# Patient Record
Sex: Male | Born: 1970 | Race: Black or African American | Hispanic: No | Smoking: Current some day smoker
Health system: Southern US, Community
[De-identification: ages and names within clinical notes are randomized; demographics above are authoritative.]

## PROBLEM LIST (undated history)

## (undated) ENCOUNTER — Ambulatory Visit (HOSPITAL_COMMUNITY): Admission: EM | Payer: Medicaid Other | Source: Home / Self Care

## (undated) DIAGNOSIS — G8929 Other chronic pain: Secondary | ICD-10-CM

## (undated) DIAGNOSIS — K649 Unspecified hemorrhoids: Secondary | ICD-10-CM

## (undated) DIAGNOSIS — T8859XA Other complications of anesthesia, initial encounter: Secondary | ICD-10-CM

## (undated) DIAGNOSIS — F319 Bipolar disorder, unspecified: Secondary | ICD-10-CM

## (undated) DIAGNOSIS — R531 Weakness: Secondary | ICD-10-CM

## (undated) DIAGNOSIS — S83519A Sprain of anterior cruciate ligament of unspecified knee, initial encounter: Secondary | ICD-10-CM

## (undated) DIAGNOSIS — G4701 Insomnia due to medical condition: Secondary | ICD-10-CM

## (undated) DIAGNOSIS — E119 Type 2 diabetes mellitus without complications: Secondary | ICD-10-CM

## (undated) DIAGNOSIS — F29 Unspecified psychosis not due to a substance or known physiological condition: Secondary | ICD-10-CM

## (undated) DIAGNOSIS — R51 Headache: Secondary | ICD-10-CM

## (undated) DIAGNOSIS — R609 Edema, unspecified: Secondary | ICD-10-CM

## (undated) DIAGNOSIS — F209 Schizophrenia, unspecified: Secondary | ICD-10-CM

## (undated) HISTORY — DX: Unspecified psychosis not due to a substance or known physiological condition: F29

## (undated) HISTORY — DX: Insomnia due to medical condition: G47.01

## (undated) HISTORY — DX: Other chronic pain: G89.29

---

## 2001-10-14 ENCOUNTER — Ambulatory Visit (HOSPITAL_BASED_OUTPATIENT_CLINIC_OR_DEPARTMENT_OTHER): Admission: RE | Admit: 2001-10-14 | Discharge: 2001-10-14 | Payer: Self-pay | Admitting: Orthopedic Surgery

## 2003-10-16 ENCOUNTER — Emergency Department (HOSPITAL_COMMUNITY): Admission: EM | Admit: 2003-10-16 | Discharge: 2003-10-16 | Payer: Self-pay | Admitting: Emergency Medicine

## 2003-10-27 ENCOUNTER — Ambulatory Visit: Payer: Self-pay | Admitting: Internal Medicine

## 2003-10-28 ENCOUNTER — Ambulatory Visit: Payer: Self-pay | Admitting: Internal Medicine

## 2003-10-28 ENCOUNTER — Ambulatory Visit: Payer: Self-pay | Admitting: *Deleted

## 2003-11-23 ENCOUNTER — Ambulatory Visit: Payer: Self-pay | Admitting: Internal Medicine

## 2003-12-14 ENCOUNTER — Ambulatory Visit: Payer: Self-pay | Admitting: Internal Medicine

## 2004-03-13 ENCOUNTER — Ambulatory Visit: Payer: Self-pay | Admitting: Internal Medicine

## 2004-03-14 ENCOUNTER — Ambulatory Visit: Payer: Self-pay | Admitting: Internal Medicine

## 2004-03-14 ENCOUNTER — Ambulatory Visit (HOSPITAL_COMMUNITY): Admission: RE | Admit: 2004-03-14 | Discharge: 2004-03-14 | Payer: Self-pay | Admitting: Internal Medicine

## 2004-04-11 ENCOUNTER — Ambulatory Visit: Payer: Self-pay | Admitting: Internal Medicine

## 2004-05-12 ENCOUNTER — Ambulatory Visit: Payer: Self-pay | Admitting: Internal Medicine

## 2004-06-29 ENCOUNTER — Emergency Department (HOSPITAL_COMMUNITY): Admission: EM | Admit: 2004-06-29 | Discharge: 2004-06-29 | Payer: Self-pay | Admitting: Emergency Medicine

## 2004-07-18 ENCOUNTER — Ambulatory Visit: Payer: Self-pay | Admitting: Internal Medicine

## 2004-08-01 ENCOUNTER — Ambulatory Visit: Payer: Self-pay | Admitting: Internal Medicine

## 2004-08-02 ENCOUNTER — Ambulatory Visit (HOSPITAL_COMMUNITY): Admission: RE | Admit: 2004-08-02 | Discharge: 2004-08-02 | Payer: Self-pay | Admitting: Internal Medicine

## 2004-10-04 ENCOUNTER — Ambulatory Visit: Payer: Self-pay | Admitting: Internal Medicine

## 2004-10-10 ENCOUNTER — Ambulatory Visit: Payer: Self-pay | Admitting: Internal Medicine

## 2005-04-03 ENCOUNTER — Emergency Department (HOSPITAL_COMMUNITY): Admission: EM | Admit: 2005-04-03 | Discharge: 2005-04-03 | Payer: Self-pay

## 2005-05-09 ENCOUNTER — Encounter: Admission: RE | Admit: 2005-05-09 | Discharge: 2005-05-22 | Payer: Self-pay | Admitting: Occupational Medicine

## 2005-12-08 ENCOUNTER — Emergency Department (HOSPITAL_COMMUNITY): Admission: EM | Admit: 2005-12-08 | Discharge: 2005-12-08 | Payer: Self-pay | Admitting: Emergency Medicine

## 2005-12-21 ENCOUNTER — Ambulatory Visit: Payer: Self-pay | Admitting: Internal Medicine

## 2007-06-27 ENCOUNTER — Emergency Department (HOSPITAL_COMMUNITY): Admission: EM | Admit: 2007-06-27 | Discharge: 2007-06-27 | Payer: Self-pay | Admitting: Emergency Medicine

## 2007-06-30 ENCOUNTER — Emergency Department (HOSPITAL_COMMUNITY): Admission: EM | Admit: 2007-06-30 | Discharge: 2007-06-30 | Payer: Self-pay | Admitting: Emergency Medicine

## 2009-01-27 ENCOUNTER — Emergency Department (HOSPITAL_COMMUNITY): Admission: EM | Admit: 2009-01-27 | Discharge: 2009-01-27 | Payer: Self-pay | Admitting: Family Medicine

## 2009-07-28 ENCOUNTER — Emergency Department (HOSPITAL_COMMUNITY): Admission: EM | Admit: 2009-07-28 | Discharge: 2009-07-29 | Payer: Self-pay | Admitting: Emergency Medicine
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
## 2010-05-30 ENCOUNTER — Emergency Department (HOSPITAL_COMMUNITY)
Admission: EM | Admit: 2010-05-30 | Discharge: 2010-05-30 | Disposition: A | Discharge status: Home / Self Care | Payer: No Typology Code available for payment source | Attending: Emergency Medicine | Admitting: Emergency Medicine

## 2010-05-30 DIAGNOSIS — M549 Dorsalgia, unspecified: Secondary | ICD-10-CM | POA: Insufficient documentation

## 2010-05-30 DIAGNOSIS — S139XXA Sprain of joints and ligaments of unspecified parts of neck, initial encounter: Secondary | ICD-10-CM | POA: Insufficient documentation

## 2010-05-30 DIAGNOSIS — M542 Cervicalgia: Secondary | ICD-10-CM | POA: Insufficient documentation

## 2012-01-30 ENCOUNTER — Emergency Department (HOSPITAL_COMMUNITY)
Admission: EM | Admit: 2012-01-30 | Discharge: 2012-01-30 | Disposition: A | Discharge status: Home / Self Care | Payer: No Typology Code available for payment source | Attending: Emergency Medicine | Admitting: Emergency Medicine

## 2012-01-30 ENCOUNTER — Encounter (HOSPITAL_COMMUNITY): Payer: Self-pay | Admitting: Emergency Medicine

## 2012-01-30 DIAGNOSIS — F17200 Nicotine dependence, unspecified, uncomplicated: Secondary | ICD-10-CM | POA: Insufficient documentation

## 2012-01-30 DIAGNOSIS — S0993XA Unspecified injury of face, initial encounter: Secondary | ICD-10-CM | POA: Insufficient documentation

## 2012-01-30 DIAGNOSIS — Y929 Unspecified place or not applicable: Secondary | ICD-10-CM | POA: Insufficient documentation

## 2012-01-30 DIAGNOSIS — Z8659 Personal history of other mental and behavioral disorders: Secondary | ICD-10-CM | POA: Insufficient documentation

## 2012-01-30 DIAGNOSIS — Y9389 Activity, other specified: Secondary | ICD-10-CM | POA: Insufficient documentation

## 2012-01-30 DIAGNOSIS — Z8739 Personal history of other diseases of the musculoskeletal system and connective tissue: Secondary | ICD-10-CM | POA: Insufficient documentation

## 2012-01-30 DIAGNOSIS — M545 Low back pain, unspecified: Secondary | ICD-10-CM | POA: Insufficient documentation

## 2012-01-30 DIAGNOSIS — S199XXA Unspecified injury of neck, initial encounter: Secondary | ICD-10-CM | POA: Insufficient documentation

## 2012-01-30 HISTORY — DX: Schizophrenia, unspecified: F20.9

## 2012-01-30 MED ORDER — CYCLOBENZAPRINE HCL 10 MG PO TABS: 10.0000 mg | ORAL_TABLET | Freq: Two times a day (BID) | ORAL | Status: DC | PRN

## 2012-01-30 MED ORDER — CYCLOBENZAPRINE HCL 10 MG PO TABS
10.0000 mg | ORAL_TABLET | Freq: Once | ORAL | Status: AC
  Administered 2012-01-30: 10 mg via ORAL
  Filled 2012-01-30: qty 1

## 2012-01-30 MED ORDER — OXYCODONE-ACETAMINOPHEN 5-325 MG PO TABS
1.0000 | ORAL_TABLET | Freq: Once | ORAL | Status: AC
  Administered 2012-01-30: 1 via ORAL
  Filled 2012-01-30: qty 1

## 2012-01-30 MED ORDER — OXYCODONE-ACETAMINOPHEN 5-325 MG PO TABS: 1.0000 | ORAL_TABLET | ORAL | Status: DC | PRN
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
## 2012-02-08 ENCOUNTER — Emergency Department (HOSPITAL_COMMUNITY)
Admission: EM | Admit: 2012-02-08 | Discharge: 2012-02-09 | Disposition: A | Discharge status: Home / Self Care | Payer: No Typology Code available for payment source | Attending: Emergency Medicine | Admitting: Emergency Medicine

## 2012-02-08 ENCOUNTER — Encounter (HOSPITAL_COMMUNITY): Payer: Self-pay | Admitting: Emergency Medicine

## 2012-02-08 DIAGNOSIS — Z79899 Other long term (current) drug therapy: Secondary | ICD-10-CM | POA: Insufficient documentation

## 2012-02-08 DIAGNOSIS — Y92410 Unspecified street and highway as the place of occurrence of the external cause: Secondary | ICD-10-CM | POA: Insufficient documentation

## 2012-02-08 DIAGNOSIS — Z8781 Personal history of (healed) traumatic fracture: Secondary | ICD-10-CM | POA: Insufficient documentation

## 2012-02-08 DIAGNOSIS — S161XXA Strain of muscle, fascia and tendon at neck level, initial encounter: Secondary | ICD-10-CM

## 2012-02-08 DIAGNOSIS — IMO0002 Reserved for concepts with insufficient information to code with codable children: Secondary | ICD-10-CM | POA: Insufficient documentation

## 2012-02-08 DIAGNOSIS — F209 Schizophrenia, unspecified: Secondary | ICD-10-CM | POA: Insufficient documentation

## 2012-02-08 DIAGNOSIS — S139XXA Sprain of joints and ligaments of unspecified parts of neck, initial encounter: Secondary | ICD-10-CM | POA: Insufficient documentation

## 2012-02-08 DIAGNOSIS — F17200 Nicotine dependence, unspecified, uncomplicated: Secondary | ICD-10-CM | POA: Insufficient documentation

## 2012-02-08 DIAGNOSIS — T148XXA Other injury of unspecified body region, initial encounter: Secondary | ICD-10-CM

## 2012-02-08 DIAGNOSIS — Y9389 Activity, other specified: Secondary | ICD-10-CM | POA: Insufficient documentation
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
## 2012-02-08 NOTE — ED Notes (Signed)
Attempted X 1 to start IV without success.

## 2012-02-09 ENCOUNTER — Emergency Department (HOSPITAL_COMMUNITY): Payer: No Typology Code available for payment source

## 2012-02-09 MED ORDER — HYDROCODONE-ACETAMINOPHEN 5-325 MG PO TABS
1.0000 | ORAL_TABLET | Freq: Once | ORAL | Status: AC
  Administered 2012-02-09: 1 via ORAL
  Filled 2012-02-09: qty 1

## 2012-02-09 MED ORDER — IBUPROFEN 800 MG PO TABS: 800.0000 mg | ORAL_TABLET | Freq: Three times a day (TID) | ORAL | Status: DC | PRN

## 2012-02-09 MED ORDER — CYCLOBENZAPRINE HCL 10 MG PO TABS: 10.0000 mg | ORAL_TABLET | Freq: Three times a day (TID) | ORAL | Status: DC | PRN

## 2012-02-09 NOTE — ED Provider Notes (Signed)
Medical screening examination/treatment/procedure(s) were performed by non-physician practitioner and as supervising physician I was immediately available for consultation/collaboration.  Olivia Mackie, MD 02/09/12 [phone]
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
## 2012-03-25 ENCOUNTER — Emergency Department (HOSPITAL_COMMUNITY)
Admission: EM | Admit: 2012-03-25 | Discharge: 2012-03-25 | Disposition: A | Discharge status: Home / Self Care | Payer: No Typology Code available for payment source | Attending: Emergency Medicine | Admitting: Emergency Medicine

## 2012-03-25 ENCOUNTER — Encounter (HOSPITAL_COMMUNITY): Payer: Self-pay | Admitting: Cardiology

## 2012-03-25 DIAGNOSIS — Y9389 Activity, other specified: Secondary | ICD-10-CM | POA: Insufficient documentation

## 2012-03-25 DIAGNOSIS — F17200 Nicotine dependence, unspecified, uncomplicated: Secondary | ICD-10-CM | POA: Insufficient documentation

## 2012-03-25 DIAGNOSIS — M542 Cervicalgia: Secondary | ICD-10-CM

## 2012-03-25 DIAGNOSIS — Z79899 Other long term (current) drug therapy: Secondary | ICD-10-CM | POA: Insufficient documentation

## 2012-03-25 DIAGNOSIS — Z8781 Personal history of (healed) traumatic fracture: Secondary | ICD-10-CM | POA: Insufficient documentation

## 2012-03-25 DIAGNOSIS — F209 Schizophrenia, unspecified: Secondary | ICD-10-CM | POA: Insufficient documentation

## 2012-03-25 DIAGNOSIS — Y92410 Unspecified street and highway as the place of occurrence of the external cause: Secondary | ICD-10-CM | POA: Insufficient documentation

## 2012-03-25 DIAGNOSIS — S0993XA Unspecified injury of face, initial encounter: Secondary | ICD-10-CM | POA: Insufficient documentation

## 2012-03-25 MED ORDER — NAPROXEN 500 MG PO TABS: 500.0000 mg | ORAL_TABLET | Freq: Two times a day (BID) | ORAL | Status: DC

## 2012-03-25 MED ORDER — CYCLOBENZAPRINE HCL 10 MG PO TABS: 10.0000 mg | ORAL_TABLET | Freq: Two times a day (BID) | ORAL | Status: DC | PRN

## 2012-03-25 MED ORDER — KETOROLAC TROMETHAMINE 60 MG/2ML IM SOLN
60.0000 mg | Freq: Once | INTRAMUSCULAR | Status: AC
  Administered 2012-03-25: 60 mg via INTRAMUSCULAR
  Filled 2012-03-25: qty 2

## 2012-03-25 MED ORDER — DIAZEPAM 5 MG PO TABS
5.0000 mg | ORAL_TABLET | Freq: Once | ORAL | Status: AC
  Administered 2012-03-25: 5 mg via ORAL
  Filled 2012-03-25: qty 1
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
## 2012-03-25 NOTE — ED Notes (Signed)
Pt given discharge paperwork; pt verbalized understanding of d/c; no additional questions regarding d/c; VSS; resps e/u; e-signature obtained; ambulatory on discharge.
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
## 2012-03-25 NOTE — ED Notes (Signed)
Denies numbness or tingling.

## 2012-03-25 NOTE — ED Provider Notes (Signed)
Medical screening examination/treatment/procedure(s) were performed by non-physician practitioner and as supervising physician I was immediately available for consultation/collaboration.   Carleene Cooper III, MD 03/25/12 2002

## 2012-11-12 ENCOUNTER — Encounter (HOSPITAL_COMMUNITY): Payer: Self-pay | Admitting: Emergency Medicine

## 2012-11-12 ENCOUNTER — Emergency Department (HOSPITAL_COMMUNITY): Payer: Medicaid Other

## 2012-11-12 ENCOUNTER — Emergency Department (HOSPITAL_COMMUNITY)
Admission: EM | Admit: 2012-11-12 | Discharge: 2012-11-12 | Disposition: A | Discharge status: Home / Self Care | Payer: Medicaid Other | Attending: Emergency Medicine | Admitting: Emergency Medicine

## 2012-11-12 DIAGNOSIS — F17200 Nicotine dependence, unspecified, uncomplicated: Secondary | ICD-10-CM | POA: Insufficient documentation

## 2012-11-12 DIAGNOSIS — F209 Schizophrenia, unspecified: Secondary | ICD-10-CM | POA: Insufficient documentation

## 2012-11-12 DIAGNOSIS — Z880 Allergy status to penicillin: Secondary | ICD-10-CM | POA: Insufficient documentation

## 2012-11-12 DIAGNOSIS — Z79899 Other long term (current) drug therapy: Secondary | ICD-10-CM | POA: Insufficient documentation

## 2012-11-12 DIAGNOSIS — M5412 Radiculopathy, cervical region: Secondary | ICD-10-CM | POA: Insufficient documentation

## 2012-11-12 DIAGNOSIS — M4802 Spinal stenosis, cervical region: Secondary | ICD-10-CM | POA: Insufficient documentation

## 2012-11-12 DIAGNOSIS — M6281 Muscle weakness (generalized): Secondary | ICD-10-CM | POA: Insufficient documentation

## 2012-11-12 DIAGNOSIS — R209 Unspecified disturbances of skin sensation: Secondary | ICD-10-CM | POA: Insufficient documentation

## 2012-11-12 DIAGNOSIS — Z8781 Personal history of (healed) traumatic fracture: Secondary | ICD-10-CM | POA: Insufficient documentation

## 2012-11-12 LAB — COMPREHENSIVE METABOLIC PANEL
ALT: 20 U/L (ref 0–53)
AST: 18 U/L (ref 0–37)
Albumin: 3.9 g/dL (ref 3.5–5.2)
Alkaline Phosphatase: 58 U/L (ref 39–117)
CO2: 24 mEq/L (ref 19–32)
Chloride: 104 mEq/L (ref 96–112)
Creatinine, Ser: 0.96 mg/dL (ref 0.50–1.35)
GFR calc non Af Amer: >90 mL/min (ref >90)
Potassium: 4.1 mEq/L (ref 3.5–5.1)
Total Bilirubin: 0.3 mg/dL (ref 0.3–1.2)

## 2012-11-12 LAB — CBC WITH DIFFERENTIAL/PLATELET
Basophils Relative: 1 % (ref 0–1)
Hemoglobin: 13.5 g/dL (ref 13.0–17.0)
Lymphocytes Relative: 35 % (ref 12–46)
Lymphs Abs: 2.3 10*3/uL (ref 0.7–4.0)
Monocytes Relative: 7 % (ref 3–12)
Neutro Abs: 3.4 10*3/uL (ref 1.7–7.7)
Neutrophils Relative %: 51 % (ref 43–77)
RBC: 4.46 MIL/uL (ref 4.22–5.81)
WBC: 6.7 10*3/uL (ref 4.0–10.5)

## 2012-11-12 MED ORDER — ONDANSETRON HCL 4 MG/2ML IJ SOLN
4.0000 mg | Freq: Once | INTRAMUSCULAR | Status: AC
  Administered 2012-11-12: 4 mg via INTRAVENOUS
  Filled 2012-11-12: qty 2

## 2012-11-12 MED ORDER — DEXAMETHASONE SODIUM PHOSPHATE 10 MG/ML IJ SOLN
10.0000 mg | Freq: Once | INTRAMUSCULAR | Status: AC
  Administered 2012-11-12: 10 mg via INTRAVENOUS
  Filled 2012-11-12: qty 1

## 2012-11-12 MED ORDER — OXYCODONE-ACETAMINOPHEN 5-325 MG PO TABS
2.0000 | ORAL_TABLET | Freq: Once | ORAL | Status: AC
  Administered 2012-11-12: 2 via ORAL
  Filled 2012-11-12: qty 2

## 2012-11-12 MED ORDER — METHYLPREDNISOLONE (PAK) 4 MG PO TABS: ORAL_TABLET | ORAL | Status: DC

## 2012-11-12 MED ORDER — HYDROMORPHONE HCL PF 1 MG/ML IJ SOLN
1.0000 mg | Freq: Once | INTRAMUSCULAR | Status: AC
  Administered 2012-11-12: 1 mg via INTRAVENOUS
  Filled 2012-11-12: qty 1

## 2012-11-12 MED ORDER — MORPHINE SULFATE 4 MG/ML IJ SOLN
4.0000 mg | Freq: Once | INTRAMUSCULAR | Status: AC
  Administered 2012-11-12: 4 mg via INTRAVENOUS
  Filled 2012-11-12: qty 1

## 2012-11-12 MED ORDER — OXYCODONE-ACETAMINOPHEN 5-325 MG PO TABS: 2.0000 | ORAL_TABLET | ORAL | Status: DC | PRN
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
## 2012-11-12 NOTE — ED Notes (Signed)
Per pt sts for the past few weeks he has been having constant bilateral arm pain and numbness. sts worse on the left side.

## 2012-11-12 NOTE — ED Notes (Signed)
Pt complains of numbness and tingling on his left arm distal to the elbow. Pt states tingling prevents him from working and it makes it difficult to sleep. 9/10 for pain in the left arm. PT states that tips of his fingers are tingling in his right arm. Pt denies chest pain. Pt denies hx of diabetes, but has a family hx of DM and HTN.

## 2012-11-13 ENCOUNTER — Other Ambulatory Visit: Payer: Self-pay | Admitting: Neurosurgery

## 2012-11-18 ENCOUNTER — Encounter (HOSPITAL_COMMUNITY): Payer: Self-pay

## 2012-11-18 ENCOUNTER — Encounter (HOSPITAL_COMMUNITY)
Admission: RE | Admit: 2012-11-18 | Discharge: 2012-11-18 | Disposition: A | Discharge status: Home / Self Care | Payer: No Typology Code available for payment source | Source: Ambulatory Visit | Attending: Neurosurgery | Admitting: Neurosurgery

## 2012-11-18 LAB — BASIC METABOLIC PANEL
BUN: 13 mg/dL (ref 6–23)
CO2: 25 mEq/L (ref 19–32)
Calcium: 9.5 mg/dL (ref 8.4–10.5)
GFR calc Af Amer: >90 mL/min (ref >90)
Glucose, Bld: 94 mg/dL (ref 70–99)
Potassium: 4 mEq/L (ref 3.5–5.1)
Sodium: 139 mEq/L (ref 135–145)

## 2012-11-18 LAB — SURGICAL PCR SCREEN: Staphylococcus aureus: NEGATIVE

## 2012-11-18 LAB — CBC
Hemoglobin: 14.2 g/dL (ref 13.0–17.0)
MCH: 30.3 pg (ref 26.0–34.0)
MCHC: 33.7 g/dL (ref 30.0–36.0)
MCV: 89.8 fL (ref 78.0–100.0)
Platelets: 249 10*3/uL (ref 150–400)
RBC: 4.69 MIL/uL (ref 4.22–5.81)
RDW: 14.2 % (ref 11.5–15.5)
WBC: 20 10*3/uL — ABNORMAL HIGH (ref 4.0–10.5)

## 2012-11-18 MED ORDER — VANCOMYCIN HCL 10 G IV SOLR
1500.0000 mg | Freq: Once | INTRAVENOUS | Status: AC
  Administered 2012-11-19: 1500 mg via INTRAVENOUS
  Filled 2012-11-18: qty 1500

## 2012-11-18 NOTE — Progress Notes (Signed)
Erie Noe at Dr Lonie Peak office made aware that patient's  WBC was 20,000 and that he was on a Medrol dose pack which he finished today.

## 2012-11-18 NOTE — Pre-Procedure Instructions (Signed)
[facility]  11/18/2012   Your procedure is scheduled on:  Wednesday November 12 th at 1540 PM  Report to [facility] Main Entrance "A"at 1300 PM.  Call this number if you have problems the morning of surgery: [phone]   Remember:   Do not eat food or drink liquids after midnight.   Take these medicines the morning of surgery with A SIP OF WATER: None   Do not wear jewelry, make-up or nail polish.  Do not wear lotions, powders. You may wear deodorant.             Men may shave face and neck.  Do not bring valuables to the hospital.  [facility] is not responsible  for any belongings or valuables.               Contacts, dentures or bridgework may not be worn into surgery.  Leave suitcase in the car. After surgery it may be brought to your room.  For patients admitted to the hospital, discharge time is determined by your treatment team.               Patients discharged the day of surgery will not be allowed to drive  home.    Special Instructions: Shower using CHG 2 nights before surgery and the night before surgery.  If you shower the day of surgery use CHG.  Use special wash - you have one bottle of CHG for all showers.  You should use approximately 1/3 of the bottle for each shower.   Please read over the following fact sheets that you were given: Pain Booklet, Coughing and Deep Breathing, MRSA Information and Surgical Site Infection Prevention

## 2012-11-18 NOTE — Progress Notes (Signed)
Anesthesia Note: Patient for C2-7 posterior cervical arthrodesis tomorrow with Dr. Wynetta Emery.  PAT was this afternoon.  He was started on a Medrol dose pack on 11/12/12.  WBC is now 20K. Most likely steroid are contributing.  I did instruct the PAT RN to notify Dr. Lonie Peak office.  Will defer additional orders to him.  Clinical correlation on the day of surgery. He was afebrile at his PAT visit.  Velna Ochs [facility] Short Stay Center/Anesthesiology Phone [phone] 11/18/2012 4:37 PM

## 2012-11-18 NOTE — Progress Notes (Signed)
Notified Dr. Wynetta Emery office that orders need to be release.

## 2012-11-19 ENCOUNTER — Inpatient Hospital Stay (HOSPITAL_COMMUNITY): Payer: No Typology Code available for payment source

## 2012-11-19 ENCOUNTER — Encounter (HOSPITAL_COMMUNITY)
Admission: RE | Disposition: A | Discharge status: Home / Self Care | Payer: Self-pay | Source: Ambulatory Visit | Attending: Neurosurgery

## 2012-11-19 ENCOUNTER — Inpatient Hospital Stay (HOSPITAL_COMMUNITY)
Admission: RE | Admit: 2012-11-19 | Discharge: 2012-11-22 | DRG: 473 | Disposition: A | Discharge status: Home / Self Care | Payer: No Typology Code available for payment source | Source: Ambulatory Visit | Attending: Neurosurgery | Admitting: Neurosurgery

## 2012-11-19 ENCOUNTER — Inpatient Hospital Stay (HOSPITAL_COMMUNITY): Payer: No Typology Code available for payment source | Admitting: Anesthesiology

## 2012-11-19 ENCOUNTER — Encounter (HOSPITAL_COMMUNITY): Payer: Self-pay | Admitting: *Deleted

## 2012-11-19 ENCOUNTER — Encounter (HOSPITAL_COMMUNITY): Payer: No Typology Code available for payment source | Admitting: Vascular Surgery

## 2012-11-19 DIAGNOSIS — M4712 Other spondylosis with myelopathy, cervical region: Secondary | ICD-10-CM | POA: Diagnosis present

## 2012-11-19 DIAGNOSIS — F209 Schizophrenia, unspecified: Secondary | ICD-10-CM | POA: Diagnosis present

## 2012-11-19 DIAGNOSIS — J438 Other emphysema: Secondary | ICD-10-CM | POA: Diagnosis present

## 2012-11-19 DIAGNOSIS — R209 Unspecified disturbances of skin sensation: Secondary | ICD-10-CM | POA: Diagnosis present

## 2012-11-19 DIAGNOSIS — F17200 Nicotine dependence, unspecified, uncomplicated: Secondary | ICD-10-CM | POA: Diagnosis present

## 2012-11-19 DIAGNOSIS — M4802 Spinal stenosis, cervical region: Secondary | ICD-10-CM

## 2012-11-19 HISTORY — PX: POSTERIOR CERVICAL FUSION/FORAMINOTOMY: SHX5038

## 2012-11-19 SURGERY — POSTERIOR CERVICAL FUSION/FORAMINOTOMY LEVEL 5
Anesthesia: General | Wound class: Clean

## 2012-11-19 MED ORDER — HYDROMORPHONE HCL PF 1 MG/ML IJ SOLN
INTRAMUSCULAR | Status: AC
  Filled 2012-11-19: qty 1

## 2012-11-19 MED ORDER — FENTANYL CITRATE 0.05 MG/ML IJ SOLN
INTRAMUSCULAR | Status: DC | PRN
  Administered 2012-11-19: 50 ug via INTRAVENOUS
  Administered 2012-11-19: 100 ug via INTRAVENOUS
  Administered 2012-11-19: 50 ug via INTRAVENOUS
  Administered 2012-11-19 (×2): 100 ug via INTRAVENOUS

## 2012-11-19 MED ORDER — ONDANSETRON HCL 4 MG/2ML IJ SOLN
INTRAMUSCULAR | Status: DC | PRN
  Administered 2012-11-19: 4 mg via INTRAVENOUS

## 2012-11-19 MED ORDER — PANTOPRAZOLE SODIUM 40 MG PO TBEC
40.0000 mg | DELAYED_RELEASE_TABLET | Freq: Two times a day (BID) | ORAL | Status: DC
  Administered 2012-11-19 – 2012-11-22 (×6): 40 mg via ORAL
  Filled 2012-11-19 (×5): qty 1

## 2012-11-19 MED ORDER — NEOSTIGMINE METHYLSULFATE 1 MG/ML IJ SOLN
INTRAMUSCULAR | Status: DC | PRN
  Administered 2012-11-19: 4 mg via INTRAVENOUS

## 2012-11-19 MED ORDER — PHENOL 1.4 % MT LIQD: 1.0000 | OROMUCOSAL | Status: DC | PRN

## 2012-11-19 MED ORDER — OXYCODONE HCL 5 MG PO TABS
5.0000 mg | ORAL_TABLET | Freq: Once | ORAL | Status: AC | PRN
  Administered 2012-11-19: 5 mg via ORAL

## 2012-11-19 MED ORDER — HYDROMORPHONE HCL PF 1 MG/ML IJ SOLN
0.2500 mg | INTRAMUSCULAR | Status: DC | PRN
  Administered 2012-11-19 (×4): 0.5 mg via INTRAVENOUS

## 2012-11-19 MED ORDER — 0.9 % SODIUM CHLORIDE (POUR BTL) OPTIME
TOPICAL | Status: DC | PRN
  Administered 2012-11-19: 1000 mL

## 2012-11-19 MED ORDER — VANCOMYCIN HCL IN DEXTROSE 1-5 GM/200ML-% IV SOLN
INTRAVENOUS | Status: AC
  Filled 2012-11-19: qty 200

## 2012-11-19 MED ORDER — SODIUM CHLORIDE 0.9 % IV SOLN: 250.0000 mL | INTRAVENOUS | Status: DC

## 2012-11-19 MED ORDER — ACETAMINOPHEN 325 MG PO TABS: 650.0000 mg | ORAL_TABLET | ORAL | Status: DC | PRN

## 2012-11-19 MED ORDER — PROPOFOL 10 MG/ML IV BOLUS
INTRAVENOUS | Status: DC | PRN
  Administered 2012-11-19: 250 mg via INTRAVENOUS

## 2012-11-19 MED ORDER — HYDROCODONE-ACETAMINOPHEN 5-325 MG PO TABS
1.0000 | ORAL_TABLET | ORAL | Status: DC | PRN
  Administered 2012-11-20 (×3): 2 via ORAL
  Filled 2012-11-19 (×3): qty 2

## 2012-11-19 MED ORDER — HYDROMORPHONE HCL PF 1 MG/ML IJ SOLN
0.5000 mg | INTRAMUSCULAR | Status: DC | PRN
  Administered 2012-11-19 – 2012-11-20 (×3): 1 mg via INTRAVENOUS
  Filled 2012-11-19 (×3): qty 1

## 2012-11-19 MED ORDER — HYDROMORPHONE HCL PF 1 MG/ML IJ SOLN
INTRAMUSCULAR | Status: AC
  Administered 2012-11-19: 0.5 mg via INTRAVENOUS
  Filled 2012-11-19: qty 1

## 2012-11-19 MED ORDER — OXYCODONE HCL 5 MG/5ML PO SOLN: 5.0000 mg | Freq: Once | ORAL | Status: AC | PRN

## 2012-11-19 MED ORDER — ALUM & MAG HYDROXIDE-SIMETH 200-200-20 MG/5ML PO SUSP: 30.0000 mL | Freq: Four times a day (QID) | ORAL | Status: DC | PRN

## 2012-11-19 MED ORDER — DEXAMETHASONE SODIUM PHOSPHATE 10 MG/ML IJ SOLN
10.0000 mg | Freq: Four times a day (QID) | INTRAMUSCULAR | Status: DC
  Administered 2012-11-19 – 2012-11-21 (×6): 10 mg via INTRAVENOUS
  Filled 2012-11-19 (×10): qty 1

## 2012-11-19 MED ORDER — GLYCOPYRROLATE 0.2 MG/ML IJ SOLN
INTRAMUSCULAR | Status: DC | PRN
  Administered 2012-11-19: .7 mg via INTRAVENOUS

## 2012-11-19 MED ORDER — DEXAMETHASONE SODIUM PHOSPHATE 10 MG/ML IJ SOLN
INTRAMUSCULAR | Status: DC | PRN
  Administered 2012-11-19: 10 mg via INTRAVENOUS

## 2012-11-19 MED ORDER — BACITRACIN ZINC 500 UNIT/GM EX OINT
TOPICAL_OINTMENT | CUTANEOUS | Status: DC | PRN
  Administered 2012-11-19: 1 via TOPICAL

## 2012-11-19 MED ORDER — CEFAZOLIN SODIUM 1-5 GM-% IV SOLN
1.0000 g | Freq: Three times a day (TID) | INTRAVENOUS | Status: AC
  Administered 2012-11-19 – 2012-11-20 (×2): 1 g via INTRAVENOUS
  Filled 2012-11-19 (×2): qty 50

## 2012-11-19 MED ORDER — LACTATED RINGERS IV SOLN
INTRAVENOUS | Status: DC
  Administered 2012-11-19 (×2): via INTRAVENOUS

## 2012-11-19 MED ORDER — LIDOCAINE-EPINEPHRINE 1 %-1:100000 IJ SOLN
INTRAMUSCULAR | Status: DC | PRN
  Administered 2012-11-19: 5 mL

## 2012-11-19 MED ORDER — OXYCODONE-ACETAMINOPHEN 5-325 MG PO TABS
2.0000 | ORAL_TABLET | ORAL | Status: DC | PRN
  Administered 2012-11-20 – 2012-11-22 (×10): 2 via ORAL
  Filled 2012-11-19 (×11): qty 2

## 2012-11-19 MED ORDER — BUPIVACAINE HCL (PF) 0.25 % IJ SOLN
INTRAMUSCULAR | Status: DC | PRN
  Administered 2012-11-19: 5 mL

## 2012-11-19 MED ORDER — RISPERIDONE 0.25 MG PO TABS
0.2500 mg | ORAL_TABLET | Freq: Every day | ORAL | Status: DC
  Administered 2012-11-20 – 2012-11-22 (×3): 0.25 mg via ORAL
  Filled 2012-11-19 (×5): qty 1

## 2012-11-19 MED ORDER — RISPERIDONE MICROSPHERES 37.5 MG IM SUSR: 37.5000 mg | INTRAMUSCULAR | Status: DC

## 2012-11-19 MED ORDER — OXYCODONE HCL 5 MG PO TABS
ORAL_TABLET | ORAL | Status: AC
  Filled 2012-11-19: qty 1

## 2012-11-19 MED ORDER — ROCURONIUM BROMIDE 100 MG/10ML IV SOLN
INTRAVENOUS | Status: DC | PRN
  Administered 2012-11-19 (×2): 10 mg via INTRAVENOUS
  Administered 2012-11-19: 50 mg via INTRAVENOUS
  Administered 2012-11-19: 10 mg via INTRAVENOUS

## 2012-11-19 MED ORDER — SODIUM CHLORIDE 0.9 % IJ SOLN
3.0000 mL | INTRAMUSCULAR | Status: DC | PRN
  Administered 2012-11-20: 3 mL via INTRAVENOUS

## 2012-11-19 MED ORDER — ONDANSETRON HCL 4 MG/2ML IJ SOLN: 4.0000 mg | INTRAMUSCULAR | Status: DC | PRN

## 2012-11-19 MED ORDER — LIDOCAINE HCL (CARDIAC) 20 MG/ML IV SOLN
INTRAVENOUS | Status: DC | PRN
  Administered 2012-11-19: 80 mg via INTRAVENOUS

## 2012-11-19 MED ORDER — ACETAMINOPHEN 650 MG RE SUPP: 650.0000 mg | RECTAL | Status: DC | PRN

## 2012-11-19 MED ORDER — CYCLOBENZAPRINE HCL 10 MG PO TABS
10.0000 mg | ORAL_TABLET | Freq: Three times a day (TID) | ORAL | Status: DC | PRN
  Administered 2012-11-20 – 2012-11-22 (×4): 10 mg via ORAL
  Filled 2012-11-19 (×4): qty 1

## 2012-11-19 MED ORDER — SODIUM CHLORIDE 0.9 % IJ SOLN
3.0000 mL | Freq: Two times a day (BID) | INTRAMUSCULAR | Status: DC
  Administered 2012-11-19 – 2012-11-22 (×4): 3 mL via INTRAVENOUS

## 2012-11-19 MED ORDER — THROMBIN 20000 UNITS EX SOLR
CUTANEOUS | Status: DC | PRN
  Administered 2012-11-19: 16:00:00 via TOPICAL

## 2012-11-19 MED ORDER — SUCCINYLCHOLINE CHLORIDE 20 MG/ML IJ SOLN
INTRAMUSCULAR | Status: DC | PRN
  Administered 2012-11-19: 120 mg via INTRAVENOUS

## 2012-11-19 MED ORDER — PROMETHAZINE HCL 25 MG/ML IJ SOLN: 6.2500 mg | INTRAMUSCULAR | Status: DC | PRN

## 2012-11-19 MED ORDER — SODIUM CHLORIDE 0.9 % IR SOLN
Status: DC | PRN
  Administered 2012-11-19: 17:00:00

## 2012-11-19 MED ORDER — MENTHOL 3 MG MT LOZG: 1.0000 | LOZENGE | OROMUCOSAL | Status: DC | PRN

## 2012-11-19 MED ORDER — LACTATED RINGERS IV SOLN
INTRAVENOUS | Status: DC | PRN
  Administered 2012-11-19 (×3): via INTRAVENOUS

## 2012-11-19 MED ORDER — MIDAZOLAM HCL 5 MG/5ML IJ SOLN
INTRAMUSCULAR | Status: DC | PRN
  Administered 2012-11-19: 2 mg via INTRAVENOUS

## 2012-11-19 SURGICAL SUPPLY — 76 items
ADH SKN CLS APL DERMABOND .7 (GAUZE/BANDAGES/DRESSINGS) ×1
APL SKNCLS STERI-STRIP NONHPOA (GAUZE/BANDAGES/DRESSINGS) ×1
BAG DECANTER FOR FLEXI CONT (MISCELLANEOUS) ×2 IMPLANT
BENZOIN TINCTURE PRP APPL 2/3 (GAUZE/BANDAGES/DRESSINGS) ×3 IMPLANT
BIT DRILL 2.4XNS REUSE 3.5X (BIT) IMPLANT
BIT DRL 2.4XNS REUSE 3.5X (BIT) ×1
BLADE SURG 11 STRL SS (BLADE) ×2 IMPLANT
BLADE SURG ROTATE 9660 (MISCELLANEOUS) ×2 IMPLANT
BUR MATCHSTICK NEURO 3.0 LAGG (BURR) ×2 IMPLANT
CANISTER SUCT 3000ML (MISCELLANEOUS) ×2 IMPLANT
CAP ELLIPSE LOCKING (Cap) ×12 IMPLANT
CONT SPEC 4OZ CLIKSEAL STRL BL (MISCELLANEOUS) ×3 IMPLANT
CROSSLINK 21-33MM (Cage) ×1 IMPLANT
DECANTER SPIKE VIAL GLASS SM (MISCELLANEOUS) ×2 IMPLANT
DERMABOND ADVANCED (GAUZE/BANDAGES/DRESSINGS) ×1
DERMABOND ADVANCED .7 DNX12 (GAUZE/BANDAGES/DRESSINGS) ×1 IMPLANT
DRAPE C-ARM 42X72 X-RAY (DRAPES) IMPLANT
DRAPE C-ARMOR (DRAPES) ×1 IMPLANT
DRAPE LAPAROTOMY 100X72 PEDS (DRAPES) ×2 IMPLANT
DRAPE MICROSCOPE ZEISS OPMI (DRAPES) IMPLANT
DRAPE POUCH INSTRU U-SHP 10X18 (DRAPES) ×2 IMPLANT
DRAPE SURG 17X23 STRL (DRAPES) ×8 IMPLANT
DRILL BIT 3.5 (BIT) ×2
DRSG OPSITE 4X5.5 SM (GAUZE/BANDAGES/DRESSINGS) ×3 IMPLANT
DRSG OPSITE POSTOP 4X8 (GAUZE/BANDAGES/DRESSINGS) ×1 IMPLANT
DURAPREP 26ML APPLICATOR (WOUND CARE) ×2 IMPLANT
ELECT REM PT RETURN 9FT ADLT (ELECTROSURGICAL) ×2
ELECTRODE REM PT RTRN 9FT ADLT (ELECTROSURGICAL) ×1 IMPLANT
EVACUATOR 1/8 PVC DRAIN (DRAIN) ×1 IMPLANT
GAUZE SPONGE 4X4 16PLY XRAY LF (GAUZE/BANDAGES/DRESSINGS) IMPLANT
GLOVE BIO SURGEON STRL SZ8 (GLOVE) ×3 IMPLANT
GLOVE BIOGEL PI IND STRL 7.5 (GLOVE) IMPLANT
GLOVE BIOGEL PI IND STRL 8 (GLOVE) IMPLANT
GLOVE BIOGEL PI INDICATOR 7.5 (GLOVE) ×2
GLOVE BIOGEL PI INDICATOR 8 (GLOVE) ×1
GLOVE ECLIPSE 7.5 STRL STRAW (GLOVE) ×4 IMPLANT
GLOVE EXAM NITRILE LRG STRL (GLOVE) IMPLANT
GLOVE EXAM NITRILE MD LF STRL (GLOVE) IMPLANT
GLOVE EXAM NITRILE XL STR (GLOVE) IMPLANT
GLOVE EXAM NITRILE XS STR PU (GLOVE) IMPLANT
GLOVE INDICATOR 8.5 STRL (GLOVE) ×2 IMPLANT
GOWN BRE IMP SLV AUR LG STRL (GOWN DISPOSABLE) IMPLANT
GOWN BRE IMP SLV AUR XL STRL (GOWN DISPOSABLE) ×5 IMPLANT
GOWN STRL REIN 2XL LVL4 (GOWN DISPOSABLE) IMPLANT
KIT BASIN OR (CUSTOM PROCEDURE TRAY) ×2 IMPLANT
KIT ROOM TURNOVER OR (KITS) ×2 IMPLANT
MARKER SKIN DUAL TIP RULER LAB (MISCELLANEOUS) ×2 IMPLANT
NDL HYPO 25X1 1.5 SAFETY (NEEDLE) ×1 IMPLANT
NDL SPNL 20GX3.5 QUINCKE YW (NEEDLE) ×1 IMPLANT
NEEDLE HYPO 25X1 1.5 SAFETY (NEEDLE) ×2 IMPLANT
NEEDLE SPNL 20GX3.5 QUINCKE YW (NEEDLE) ×2 IMPLANT
NS IRRIG 1000ML POUR BTL (IV SOLUTION) ×2 IMPLANT
PACK LAMINECTOMY NEURO (CUSTOM PROCEDURE TRAY) ×2 IMPLANT
PAD ARMBOARD 7.5X6 YLW CONV (MISCELLANEOUS) ×6 IMPLANT
PIN MAYFIELD SKULL DISP (PIN) ×2 IMPLANT
PUTTY BONE DBX 2.5 MIS (Bone Implant) ×1 IMPLANT
ROD 3.5X240MM (Rod) ×1 IMPLANT
RUBBERBAND STERILE (MISCELLANEOUS) IMPLANT
SCREW 3.5X12MM (Screw) ×10 IMPLANT
SCREW 3.5X18MM (Screw) ×2 IMPLANT
SPONGE GAUZE 4X4 12PLY (GAUZE/BANDAGES/DRESSINGS) ×2 IMPLANT
SPONGE LAP 4X18 X RAY DECT (DISPOSABLE) IMPLANT
SPONGE SURGIFOAM ABS GEL 100 (HEMOSTASIS) ×2 IMPLANT
STRIP BIOACTIVE 5CC 25X50X4MM (Miscellaneous) ×1 IMPLANT
STRIP CLOSURE SKIN 1/2X4 (GAUZE/BANDAGES/DRESSINGS) ×2 IMPLANT
SUT ETHILON 4 0 PS 2 18 (SUTURE) IMPLANT
SUT VIC AB 0 CT1 18XCR BRD8 (SUTURE) ×1 IMPLANT
SUT VIC AB 0 CT1 8-18 (SUTURE) ×4
SUT VIC AB 2-0 CT1 18 (SUTURE) ×3 IMPLANT
SUT VICRYL 4-0 PS2 18IN ABS (SUTURE) ×2 IMPLANT
SYR 20ML ECCENTRIC (SYRINGE) ×2 IMPLANT
TOWEL OR 17X24 6PK STRL BLUE (TOWEL DISPOSABLE) ×2 IMPLANT
TOWEL OR 17X26 10 PK STRL BLUE (TOWEL DISPOSABLE) ×2 IMPLANT
TRAY FOLEY CATH 14FRSI W/METER (CATHETERS) IMPLANT
TRAY FOLEY CATH 16FRSI W/METER (SET/KITS/TRAYS/PACK) ×1 IMPLANT
WATER STERILE IRR 1000ML POUR (IV SOLUTION) ×2 IMPLANT

## 2012-11-19 NOTE — Anesthesia Procedure Notes (Signed)
Procedure Name: Intubation Date/Time: 11/19/2012 3:40 PM Performed by: Gwenyth Allegra Pre-anesthesia Checklist: Patient identified, Timeout performed, Emergency Drugs available, Suction available and Patient being monitored Patient Re-evaluated:Patient Re-evaluated prior to inductionOxygen Delivery Method: Circle system utilized Preoxygenation: Pre-oxygenation with 100% oxygen Intubation Type: IV induction Ventilation: Mask ventilation without difficulty Grade View: Grade I Tube type: Oral Tube size: 7.5 mm Number of attempts: 1 Airway Equipment and Method: Video-laryngoscopy Placement Confirmation: ETT inserted through vocal cords under direct vision,  breath sounds checked- equal and bilateral and positive ETCO2 Secured at: 23 cm Tube secured with: Tape Dental Injury: Teeth and Oropharynx as per pre-operative assessment

## 2012-11-19 NOTE — Preoperative (Signed)
Beta Blockers   Reason not to administer Beta Blockers:Not Applicable 

## 2012-11-19 NOTE — H&P (Signed)
Roger Woods is an 42 y.o. male.   Chief Complaint: Numbness tingling weakness in his hands HPI: Patient is a 42 year old gentleman who has had over last several months of progressive weakness in his arms and hands and difficulty opening jars but and closed numbness and tingling patient showed up in the ER who underwent a workup initially with an MRI scan and subsequent CT scan showed severe cervical stenosis from C2-C7 2 to OPLL(or ossification of the posterior longitudinal ligament). This was placed on Decadron and noted some symptomatic improvement patient presents today for posterior cervical decompression fusion. I extensively reviewed the risks and benefits of the operation with him as well as perioperative course expectations of outcome and alternatives of surgery he understands and agrees to proceed forward.  Past Medical History  Diagnosis Date  . Schizophrenia   . Leg fracture, left   . Arm fracture, left   . Shortness of breath     History reviewed. No pertinent past surgical history.  History reviewed. No pertinent family history. Social History:  reports that he has been smoking Cigarettes.  He has a 7 pack-year smoking history. He has never used smokeless tobacco. He reports that he drinks alcohol. He reports that he does not use illicit drugs.  Allergies:  Allergies  Allergen Reactions  . Penicillins Other (See Comments)    childhood    Medications Prior to Admission  Medication Sig Dispense Refill  . methylPREDNISolone (MEDROL DOSEPAK) 4 MG tablet Take 4-8 mg by mouth See admin instructions. Day 1: 2 tablets before breakfast, 1 tablet after lunch and supper and 2 tablets at bedtime. Day 2:  1 tablet before breakfast, 1 tablet after lunch and supper and 2 tablets at bedtime. Day 3:  1 tablet before breakfast, 1 tablet after lunch and supper and at bedtime. Day 4:  1 tablet before breakfast, lunch, and bedtime Day 5:  1 tablet before breakfast and bedtime. Day 6:  1  tablet fore breakfast      . oxyCODONE-acetaminophen (PERCOCET/ROXICET) 5-325 MG per tablet Take 2 tablets by mouth every 4 (four) hours as needed for severe pain.  15 tablet  0  . risperiDONE (RISPERDAL) 0.25 MG tablet Take 0.25 mg by mouth daily.      . risperiDONE microspheres (RISPERDAL CONSTA) 37.5 MG injection Inject 37.5 mg into the muscle every 14 (fourteen) days.        Results for orders placed during the hospital encounter of 11/18/12 (from the past 48 hour(s))  SURGICAL PCR SCREEN     Status: None   Collection Time    11/18/12  1:43 PM      Result Value Range   MRSA, PCR NEGATIVE  NEGATIVE   Staphylococcus aureus NEGATIVE  NEGATIVE   Comment:            The Xpert SA Assay (FDA     approved for NASAL specimens     in patients over 54 years of age),     is one component of     a comprehensive surveillance     program.  Test performance has     been validated by [employer] for patients greater     than or equal to 55 year old.     It is not intended     to diagnose infection nor to     guide or monitor treatment.  BASIC METABOLIC PANEL     Status: None   Collection Time  11/18/12  1:43 PM      Result Value Range   Sodium 139  135 - 145 mEq/L   Potassium 4.0  3.5 - 5.1 mEq/L   Chloride 104  96 - 112 mEq/L   CO2 25  19 - 32 mEq/L   Glucose, Bld 94  70 - 99 mg/dL   BUN 13  6 - 23 mg/dL   Creatinine, Ser 1.61  0.50 - 1.35 mg/dL   Calcium 9.5  8.4 - 09.6 mg/dL   GFR calc non Af Amer >90  >90 mL/min   GFR calc Af Amer >90  >90 mL/min   Comment: (NOTE)     The eGFR has been calculated using the CKD EPI equation.     This calculation has not been validated in all clinical situations.     eGFR's persistently <90 mL/min signify possible Chronic Kidney     Disease.  CBC     Status: Abnormal   Collection Time    11/18/12  1:43 PM      Result Value Range   WBC 20.0 (*) 4.0 - 10.5 K/uL   RBC 4.69  4.22 - 5.81 MIL/uL   Hemoglobin 14.2  13.0 - 17.0 g/dL   HCT 04.5   40.9 - 81.1 %   MCV 89.8  78.0 - 100.0 fL   MCH 30.3  26.0 - 34.0 pg   MCHC 33.7  30.0 - 36.0 g/dL   RDW 91.4  78.2 - 95.6 %   Platelets 249  150 - 400 K/uL   No results found.  Review of Systems  Constitutional: Negative.   Eyes: Negative.   Respiratory: Negative.   Cardiovascular: Negative.   Gastrointestinal: Negative.   Genitourinary: Negative.   Musculoskeletal: Positive for myalgias and neck pain.  Skin: Negative.   Neurological: Positive for tingling, tremors and sensory change.  Endo/Heme/Allergies: Negative.   Psychiatric/Behavioral: Negative.     Blood pressure 121/65, pulse 68, temperature 97.2 F (36.2 C), temperature source Oral, resp. rate 20, SpO2 99.00%. Physical Exam  Constitutional: He is oriented to person, place, and time. He appears well-developed and well-nourished.  HENT:  Head: Normocephalic and atraumatic.  Eyes: Pupils are equal, round, and reactive to light.  Neck: Normal range of motion.  Respiratory: Effort normal.  GI: Soft.  Musculoskeletal: Normal range of motion.  Neurological: He is alert and oriented to person, place, and time. GCS eye subscore is 4. GCS verbal subscore is 5. GCS motor subscore is 6.  Strength is approximately is 5 out of 5 in his deltoids, biceps and wrist extension however triceps wrist flexion and intrinsics are weak at 4/5 and intrinsics closed with 4 minus out of 5 lower Sherry strength is 5 out of 5  Skin: Skin is warm and dry.     Assessment/Plan 42 year old gentleman presents for posterior cervical decompression fusion  Barba Solt P 11/19/2012, 2:23 PM

## 2012-11-19 NOTE — Anesthesia Postprocedure Evaluation (Signed)
  Anesthesia Post-op Note  Patient: Roger Woods  Procedure(s) Performed: Procedure(s) with comments: CERVICAL TWO TO CERVICAL SEVEN POSTERIOR CERVICAL FUSION/FORAMINOTOMY LEVEL 5 (N/A) - C2-7 posteior cervical arthrodesis with instrumentation  Patient Location: PACU  Anesthesia Type:General  Level of Consciousness: awake, alert  and oriented  Airway and Oxygen Therapy: Patient Spontanous Breathing and Patient connected to nasal cannula oxygen  Post-op Pain: moderate  Post-op Assessment: Post-op Vital signs reviewed  Post-op Vital Signs: Reviewed  Complications: No apparent anesthesia complications

## 2012-11-19 NOTE — Transfer of Care (Signed)
Immediate Anesthesia Transfer of Care Note  Patient: Roger Woods  Procedure(s) Performed: Procedure(s) with comments: CERVICAL TWO TO CERVICAL SEVEN POSTERIOR CERVICAL FUSION/FORAMINOTOMY LEVEL 5 (N/A) - C2-7 posteior cervical arthrodesis with instrumentation  Patient Location: PACU  Anesthesia Type:General  Level of Consciousness: awake, alert  and oriented  Airway & Oxygen Therapy: Patient Spontanous Breathing and Patient connected to face mask oxygen  Post-op Assessment: Report given to PACU RN, Post -op Vital signs reviewed and stable and Patient moving all extremities X 4  Post vital signs: Reviewed and stable  Complications: No apparent anesthesia complications

## 2012-11-19 NOTE — Anesthesia Preprocedure Evaluation (Signed)
Anesthesia Evaluation  Patient identified by MRN, date of birth, ID band Patient awake    Reviewed: Allergy & Precautions, H&P , NPO status , Patient's Chart, lab work & pertinent test results  Airway Mallampati: II TM Distance: >3 FB Neck ROM: Limited  Mouth opening: Limited Mouth Opening  Dental   Pulmonary shortness of breath, COPDCurrent Smoker,  + rhonchi         Cardiovascular Rhythm:Regular Rate:Normal     Neuro/Psych Schizophrenia    GI/Hepatic   Endo/Other    Renal/GU      Musculoskeletal   Abdominal (+) + obese,   Peds  Hematology   Anesthesia Other Findings   Reproductive/Obstetrics                           Anesthesia Physical Anesthesia Plan  ASA: III  Anesthesia Plan: General   Post-op Pain Management:    Induction: Intravenous  Airway Management Planned: Oral ETT and Video Laryngoscope Planned  Additional Equipment:   Intra-op Plan:   Post-operative Plan: Extubation in OR  Informed Consent: I have reviewed the patients History and Physical, chart, labs and discussed the procedure including the risks, benefits and alternatives for the proposed anesthesia with the patient or authorized representative who has indicated his/her understanding and acceptance.     Plan Discussed with: CRNA and Surgeon  Anesthesia Plan Comments:         Anesthesia Quick Evaluation

## 2012-11-19 NOTE — Op Note (Signed)
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 Using the globus ellipse lateral mass and pedicle screw system  #3 posterior lateral arthrodesis using locally harvested autograft mixed with kinex and DBX in the facet joints and laterally along the lateral masses C2-C7   placement of a medium Hemovac drain  Surgeon: Jillyn Hidden [employer] Mcjunkins  Assistant: Shirlean Kelly  Anesthesia: Gen.  EBL: Minimal  History of present illness: Patient is a pleasant 42 year old gentleman presented to the emergency department with worsening weakness over several months and his hands and arms workup revealed ossification of the posterior longitudinal ligament and severe cervical stenosis with signal change within the cord from C2-C7 due to patient's progression of clinical syndrome and imaging findings I recommended decompression sterilization procedure posteriorly I extensively reviewed the risks and benefits of the operation the patient as well as perioperative course expectations about alternatives of surgery and he understood and agreed to proceed forward.  Operative procedure: Patient was positioned prone in pins neck in neutral position and the backside of his head neck was prepped and draped in routine sterile fashion. After infiltration of 10 cc lidocaine with epi a midline incision was made and Bovie light cautery was used to take down the subcutaneous tissues and subperiosteal dissections care lamina of C2, C3, C4, C5, C6, and C7. Attention was first taken to placement of the lateral mass screws so using a high-speed drill pilot holes  were drilled in the inferomedial quadrant of each lateral mass from C3-C7 bilaterally and then the lateral masses were cannulated with a 12 mm drill bit probed to be a cortex was tapped and 12 mm screws were inserted each lateral mass from C3-C7. All screws excellent purchase after placement of all the lateral mass screws attention second the decompression central decompression was initiated with the spinous processes at C3, C4, C5, C6 removed central decompression was begun by marking out the lamina on the lateral aspect of the gutter using a 1 and 2 mm Kerrison punch by thinning out the gutters lamina lateral lamina with a high-speed drill. Then I removed the central lamina en bloc after both lateral gutters and then freed up in order to minimize the manipulation of the spinal cord. I carried the laminectomy up through C2 to record feel and visualized and palpated the superior aspect of the C2 pedicle and after complete decompressive laminectomies medial partial medial facetectomies and foraminotomy some performed at all levels attention was taken to the C5 pedicle screw placement using both AP and lateral C-arm fluoroscopy the entry point was selected and initially a 16 mm hole was drilled followed by 18 mm hole near cortex was again tapped a think 18 mm 3.5 screws were then inserted the C2 pedicle under direct visualization of the medial border of the pedicle and fluoroscopy. All screws excellent purchase then add the wounds and copiously irrigated meticulous in space was maintained aggressive decortication was care MTPs in the facet complexes from C2-C7 local autograft mixed with Kinex and DBX was then packed in the lateral masses in the facet joints and along the dorsal aspect lateral masses bilaterally. Then the rods were cut and fashioned and inserted all top tightness tightened  down and torqued down a cross-link was applied Gelfoam was overlaid top of the dura a medium neck drain was placed and the wounds  closed in layers with interrupted Vicryl the skin was) 4 subcuticular benzo and Steri-Strips were applied patient recovered in stable condition. At the end of case on it counts sponge counts were correct.

## 2012-11-20 NOTE — Progress Notes (Signed)
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 [name], male   DOB: 1970/03/29, 42 y.o.   MRN: [id number]   Roger Woods 11/20/2012, 7:14 PM

## 2012-11-20 NOTE — Progress Notes (Signed)
Subjective: Patient reports Overall is doing okay he's got some dysesthesias in the left shoulder and left side of his neck but overall he feels like is moving his arms better  Objective: Vital signs in last 24 hours: Temp:  [97.2 F (36.2 C)-99.6 F (37.6 C)] 99.1 F (37.3 C) (11/13 0543) Pulse Rate:  [56-86] 66 (11/13 0543) Resp:  [18-20] 20 (11/13 0245) BP: (121-141)/(65-77) 132/68 mmHg (11/13 0543) SpO2:  [97 %-100 %] 97 % (11/13 0543) Weight:  [104.781 kg (231 lb)] 104.781 kg (231 lb) (11/12 2331)  Intake/Output from previous day: 11/12 0701 - 11/13 0700 In: 2640 [P.O.:240; I.V.:2400] Out: 3035 [Urine:2695; Drains:90; Blood:250] Intake/Output this shift:    Neurologically he stable actually is triceps strength seems to be slightly improved from preop still has some pain limited weakness but overall is 5 out of 5 in his biceps able to raise his arms up over his head grip strength remains weak at 4 minus out of 5  Lab Results:  Recent Labs  11/18/12 1343  WBC 20.0*  HGB 14.2  HCT 42.1  PLT 249   BMET  Recent Labs  11/18/12 1343  NA 139  K 4.0  CL 104  CO2 25  GLUCOSE 94  BUN 13  CREATININE 0.88  CALCIUM 9.5    Studies/Results: Dg Cervical Spine 2-3 Views  11/19/2012   CLINICAL DATA:  C2-7 posterior cervical fusion.  EXAM: CERVICAL SPINE - 2-3 VIEW; DG C-ARM 1-60 MIN  COMPARISON:  Cervical spine CT dated 11/12/2012.  FINDINGS: 3 C arm views of the cervical spine demonstrate pedicle screw placement at the C2 through C7 levels. Endotracheal and nasogastric tubes are in place.  IMPRESSION: Pedicle screw placement at the C2 through C7 levels.   Electronically Signed   By: Gordan Payment M.D.   On: 11/19/2012 20:48   Dg C-arm 1-60 Min  11/19/2012   CLINICAL DATA:  C2-7 posterior cervical fusion.  EXAM: CERVICAL SPINE - 2-3 VIEW; DG C-ARM 1-60 MIN  COMPARISON:  Cervical spine CT dated 11/12/2012.  FINDINGS: 3 C arm views of the cervical spine demonstrate pedicle screw  placement at the C2 through C7 levels. Endotracheal and nasogastric tubes are in place.  IMPRESSION: Pedicle screw placement at the C2 through C7 levels.   Electronically Signed   By: Gordan Payment M.D.   On: 11/19/2012 20:48    Assessment/Plan: Progressive mobilization today with physical and occupational therapy continue on his Decadron  LOS: 1 day     Laporshia Hogen P 11/20/2012, 7:42 AM

## 2012-11-20 NOTE — Progress Notes (Signed)
Utilization review completed. Linda Grimmer, RN, BSN. 

## 2012-11-20 NOTE — Progress Notes (Signed)
Pt c/o soreness/weakness in UEs bilaterally, as well as numbness and tingling (See Neuro assessments).

## 2012-11-20 NOTE — Evaluation (Signed)
Occupational Therapy Evaluation Patient Details Name: Roger Woods MRN: [id number] DOB: 1970-07-02 Today's Date: 11/20/2012 Time: 1308-6578 OT Time Calculation (min): 31 min  OT Assessment / Plan / Recommendation History of present illness s/p c2-c7 posterior fusion   Clinical Impression   Patient is s/p C2-C7 surgery resulting in functional limitations due to the deficits listed below (see OT problem list).  Patient will benefit from skilled OT acutely to increase independence and safety with ADLS to allow discharge home. Pt declined outpatient due to inability to ride the bus . Pt reports bus transportation too painful.     OT Assessment  Patient needs continued OT Services    Follow Up Recommendations  No OT follow up    Barriers to Discharge      Equipment Recommendations  None recommended by OT    Recommendations for Other Services    Frequency  Min 2X/week    Precautions / Restrictions Precautions Precautions: Cervical Required Braces or Orthoses: Cervical Brace Cervical Brace: Hard collar   Pertinent Vitals/Pain Reports pain but able to participate currently No rating provided    ADL  Eating/Feeding: Set up (red foam) Where Assessed - Eating/Feeding: Chair Grooming: Wash/dry face;Supervision/safety Where Assessed - Grooming: Unsupported standing Toilet Transfer: Min Psychologist, sport and exercise: Regular height toilet (standing) Toileting - Clothing Manipulation and Hygiene: Min guard Where Assessed - Engineer, mining and Hygiene: Standing Equipment Used: Rolling walker Transfers/Ambulation Related to ADLs: pt ambulating to bathroom using RW min guard (A) ADL Comments: Pt educated on all adls with cervical precautions. pt educated using mirror for don / doff brace and changing pads. Pt encouraged to do it himself to help with carry over. Pt closing eyes initially during session and needed change of position to help with arousal. Pt asking for  more pain medication but currently not allowed more for 45 minutes. RN aware and pt aware. Pt states Okay I can wait. pt completed toilet trasnfer and hand hygiene. pt setup in chair with pillows under bil UE for self feeding. Pt provided foam spoon and food. Pt eating ice cream and pumpkin pie together.     OT Diagnosis: Generalized weakness;Acute pain  OT Problem List: Decreased strength;Decreased activity tolerance;Decreased safety awareness;Decreased knowledge of use of DME or AE;Decreased knowledge of precautions;Pain OT Treatment Interventions: Self-care/ADL training;Therapeutic exercise;DME and/or AE instruction;Therapeutic activities;Patient/family education   OT Goals(Current goals can be found in the care plan section) Acute Rehab OT Goals Patient Stated Goal: to go home OT Goal Formulation: With patient Time For Goal Achievement: 12/04/12 Potential to Achieve Goals: Good  Visit Information  Last OT Received On: 11/20/12 Assistance Needed: +1 History of Present Illness: s/p c2-c7 posterior fusion       Prior Functioning     Home Living Family/patient expects to be discharged to:: Private residence Living Arrangements: Other relatives Available Help at Discharge: Family;Available 24 hours/day Type of Home: House Home Access: Stairs to enter Entrance Stairs-Rails: Left Home Layout: Two level Alternate Level Stairs-Number of Steps: flight Home Equipment: None Prior Function Level of Independence: Independent Communication Communication: No difficulties Dominant Hand: Right         Vision/Perception Vision - History Baseline Vision: No visual deficits Patient Visual Report: No change from baseline   Cognition  Cognition Arousal/Alertness: Awake/alert Behavior During Therapy: WFL for tasks assessed/performed Overall Cognitive Status: Within Functional Limits for tasks assessed    Extremity/Trunk Assessment Upper Extremity Assessment Upper Extremity  Assessment: Generalized weakness Lower Extremity Assessment Lower Extremity Assessment:  Defer to PT evaluation     Mobility Bed Mobility Bed Mobility: Supine to Sit;Sitting - Scoot to Edge of Bed Rolling Right: 4: Min assist;With rail Right Sidelying to Sit: 4: Min assist;HOB elevated;With rails Supine to Sit: 4: Min assist;With rails;HOB elevated Details for Bed Mobility Assistance: cues for hand placement and cervical precautions Transfers Transfers: Sit to Stand;Stand to Sit Sit to Stand: 4: Min assist;With upper extremity assist;From bed Stand to Sit: 4: Min assist;With upper extremity assist;To chair/3-in-1 Details for Transfer Assistance:  pt needed cues for hand placement and to control descend to chair.      Exercise     Balance     End of Session OT - End of Session Activity Tolerance: Patient tolerated treatment well Patient left: in chair;with call bell/phone within reach Nurse Communication: Mobility status;Precautions  GO     Harolyn Rutherford 11/20/2012, 4:26 PM Pager: [phone]

## 2012-11-20 NOTE — Evaluation (Signed)
Physical Therapy Evaluation Patient Details Name: Roger Woods MRN: [id number] DOB: [birth year] Today's Date: 11/20/2012 Time: 0930-1000 PT Time Calculation (min): 30 min  PT Assessment / Plan / Recommendation History of Present Illness  s/p c2-c7 posterior fusion  Clinical Impression  Pt presents with decreased strength and mobility and increased pain and will benefit from skilled PT to address deficits and increase functional independence.  Pt will benefit from HHPT at d/c to continue progressing with strength and mobility.    PT Assessment  Patient needs continued PT services    Follow Up Recommendations  Home health PT    Does the patient have the potential to tolerate intense rehabilitation      Barriers to Discharge        Equipment Recommendations  None recommended by PT    Recommendations for Other Services OT consult   Frequency Min 5X/week    Precautions / Restrictions Precautions Precautions: Cervical Required Braces or Orthoses: Cervical Brace Cervical Brace: Hard collar Restrictions Weight Bearing Restrictions: No   Pertinent Vitals/Pain Pt c/o pain in B UEs, eased with rest and repositioning      Mobility  Bed Mobility Bed Mobility: Rolling Right;Right Sidelying to Sit;Sit to Sidelying Left Rolling Right: 4: Min assist Right Sidelying to Sit: 4: Min assist Sit to Sidelying Left: 4: Min assist Details for Bed Mobility Assistance: cues for log roll, increased time, assist with trunk/shoulders due to pain Transfers Transfers: Sit to Stand;Stand to Sit Sit to Stand: 4: Min assist Stand to Sit: 4: Min assist Details for Transfer Assistance: pt unable to assist with UEs due to pain/weakness, needs assist from low surfaces Ambulation/Gait Ambulation/Gait Assistance: 4: Min guard Ambulation Distance (Feet): 300 Feet Assistive device: None Ambulation/Gait Assistance Details: decreased cadence, limited by pain at surgical site    Exercises     PT  Diagnosis: Difficulty walking;Generalized weakness;Acute pain  PT Problem List: Decreased strength;Decreased activity tolerance;Decreased balance;Decreased mobility;Pain PT Treatment Interventions: DME instruction;Gait training;Stair training;Functional mobility training;Therapeutic activities;Balance training;Therapeutic exercise;Neuromuscular re-education;Modalities;Patient/family education     PT Goals(Current goals can be found in the care plan section) Acute Rehab PT Goals Patient Stated Goal: get right PT Goal Formulation: With patient Time For Goal Achievement: 11/27/12 Potential to Achieve Goals: Good  Visit Information  Last PT Received On: 11/20/12 Assistance Needed: +1 History of Present Illness: s/p c2-c7 posterior fusion       Prior Functioning  Home Living Family/patient expects to be discharged to:: Private residence Living Arrangements: Other relatives Available Help at Discharge: Family;Available 24 hours/day Type of Home: House Home Access: Stairs to enter Entrance Stairs-Rails: Left Home Layout: Two level Alternate Level Stairs-Number of Steps: flight Home Equipment: None Prior Function Level of Independence: Independent Communication Communication: No difficulties    Cognition  Cognition Arousal/Alertness: Awake/alert Behavior During Therapy: WFL for tasks assessed/performed Overall Cognitive Status: Within Functional Limits for tasks assessed    Extremity/Trunk Assessment Lower Extremity Assessment Lower Extremity Assessment: Generalized weakness Cervical / Trunk Assessment Cervical / Trunk Assessment:  (cervical collar)   Balance Static Standing Balance Static Standing - Balance Support: During functional activity Static Standing - Level of Assistance: 4: Min assist Dynamic Standing Balance Dynamic Standing - Balance Support: During functional activity Dynamic Standing - Level of Assistance: 4: Min assist Performed balance without AD for  toileting and transfers with min A  End of Session PT - End of Session Equipment Utilized During Treatment: Gait belt Activity Tolerance: Patient tolerated treatment well;Patient limited by pain Patient left:  in bed;with call bell/phone within reach;with bed alarm set Nurse Communication: Mobility status  GP     Charistopher Rumble 11/20/2012, 9:59 AM

## 2012-11-21 MED ORDER — DEXAMETHASONE 4 MG PO TABS
4.0000 mg | ORAL_TABLET | Freq: Four times a day (QID) | ORAL | Status: DC
  Administered 2012-11-21 – 2012-11-22 (×5): 4 mg via ORAL
  Filled 2012-11-21 (×8): qty 1

## 2012-11-21 MED ORDER — DEXAMETHASONE SODIUM PHOSPHATE 4 MG/ML IJ SOLN
4.0000 mg | Freq: Four times a day (QID) | INTRAMUSCULAR | Status: DC
  Filled 2012-11-21 (×4): qty 0.4

## 2012-11-21 NOTE — Progress Notes (Signed)
Physical medicine rehabilitation consult was requested. Chart is been reviewed noted physical occupational therapy evaluations completed 11/20/2012. Patient currently ambulating 300 feet min assist without assistive device and recommendations made for home health therapies. Hold formal rehab consult at this time with recommendations therapy

## 2012-11-21 NOTE — Progress Notes (Signed)
CM CONSULT Talked to patient with family members (grandmother/ cousin) about discharge planning; family members wants the patient to go to a facility to recover/short term therapy but the patient does not qualify for a rehab facility; Family members/ grandmother does not want the patient to return home to be alone; CM encouraged family member to help the patient as much as possible/ lots of family issues involved. Patient's cousin stated that the patient could live with him at discharge if that was what the patient wanted. CM again encouraged the family to work together and they stated that they will; Abelino Derrick RN,BSN,MHA [phone]

## 2012-11-21 NOTE — Progress Notes (Signed)
Physical Therapy Treatment Patient Details Name: Roger Woods MRN: [id number] DOB: [birth year] Today's Date: 11/21/2012 Time: 4696-2952 PT Time Calculation (min): 24 min  PT Assessment / Plan / Recommendation  History of Present Illness s/p c2-c7 posterior fusion   PT Comments   Patient ambulating well, some initial rigidity and slow cadence but improved with prompting.  Patient able to perform many daily tasks without any need for physical assist. During session, patient stood to use the bathroom, then performed various hygiene activities at sink without physical assist. Spoke with patient and family at length regarding patient's needs and current functional levels. Patient with modest mobility deficits. Will continue to see and address as needed, continued to recommend d/c with HHPT initial evaluation for mobility in home environment. Patient and family had some concerns over patient's functional abilities and need for assist, but patient was able to demonstrate ability to ambulate and perform self care tasks without any physical assist. Will continue to see and progress as tolerated.   Follow Up Recommendations  Home health PT           Equipment Recommendations  None recommended by PT       Frequency Min 5X/week   Progress towards PT Goals  Progressing towards goals  Plan Current plan remains appropriate    Precautions / Restrictions Precautions Precautions: Cervical Required Braces or Orthoses: Cervical Brace Cervical Brace: Hard collar Restrictions Weight Bearing Restrictions: No   Pertinent Vitals/Pain "soreness" in arms    Mobility  Bed Mobility Bed Mobility: Not assessed Details for Bed Mobility Assistance: pt seated EOB upon arrival Transfers Transfers: Sit to Stand;Stand to Sit Sit to Stand: 6: Modified independent (Device/Increase time) Stand to Sit: 6: Modified independent (Device/Increase time) Details for Transfer Assistance: increased time to  perform Ambulation/Gait Ambulation/Gait Assistance: 7: Independent;5: Supervision Ambulation Distance (Feet): 410 Feet Assistive device: None Ambulation/Gait Assistance Details: VCs for increased speed initially, improved stability once cadence increased Gait Pattern: Within Functional Limits Gait velocity: initially decreased General Gait Details: some initial instability, improved with further ambulation      PT Goals (current goals can now be found in the care plan section) Acute Rehab PT Goals Patient Stated Goal: to go home PT Goal Formulation: With patient Time For Goal Achievement: 11/27/12 Potential to Achieve Goals: Good  Visit Information  Last PT Received On: 11/21/12 Assistance Needed: +1 History of Present Illness: s/p c2-c7 posterior fusion    Subjective Data  Subjective: I need to use the bathroom Patient Stated Goal: to go home   Cognition  Cognition Arousal/Alertness: Awake/alert Behavior During Therapy: WFL for tasks assessed/performed Overall Cognitive Status: Within Functional Limits for tasks assessed    Balance  Static Standing Balance Static Standing - Balance Support: During functional activity Static Standing - Level of Assistance: 7: Independent Static Standing - Comment/# of Minutes: Stood to urinated independently in bathroom, then ambulated to sink, was able to wash hands, face, brush teeth at sink without any physical assist. Dynamic Standing Balance Dynamic Standing - Balance Support: During functional activity Dynamic Standing - Level of Assistance: 7: Independent  End of Session PT - End of Session Equipment Utilized During Treatment: Gait belt Activity Tolerance: Patient tolerated treatment well;Patient limited by pain Patient left: in chair;with call bell/phone within reach Nurse Communication: Mobility status   GP     Fabio Asa 11/21/2012, 2:44 PM Charlotte Crumb, PT DPT  [phone]

## 2012-11-21 NOTE — Progress Notes (Signed)
CIR denied patient, Pt family stating he is not able to go home as he does not have any assistance there upon discharge. Dr. Wynetta Emery asked for social work consult to be placed to evaluate situation and if patient could go to another rehab.

## 2012-11-21 NOTE — Progress Notes (Signed)
Subjective: Patient reports Patient doing well some improvement in his hands but still significant weakness and numbness so slow to mobilize  Objective: Vital signs in last 24 hours: Temp:  [98.2 F (36.8 C)-99.2 F (37.3 C)] 98.8 F (37.1 C) (11/14 0507) Pulse Rate:  [58-66] 63 (11/14 0507) Resp:  [16-20] 18 (11/14 0507) BP: (146-183)/(64-88) 147/71 mmHg (11/14 0507) SpO2:  [96 %-99 %] 98 % (11/14 0507)  Intake/Output from previous day: 11/13 0701 - 11/14 0700 In: 480 [P.O.:480] Out: 165 [Drains:165] Intake/Output this shift:    Strength improving 4 minus out of 5 in intrinsics 4-4+ out of 5 triceps  Lab Results:  Recent Labs  11/18/12 1343  WBC 20.0*  HGB 14.2  HCT 42.1  PLT 249   BMET  Recent Labs  11/18/12 1343  NA 139  K 4.0  CL 104  CO2 25  GLUCOSE 94  BUN 13  CREATININE 0.88  CALCIUM 9.5    Studies/Results: Dg Cervical Spine 2-3 Views  11/19/2012   CLINICAL DATA:  C2-7 posterior cervical fusion.  EXAM: CERVICAL SPINE - 2-3 VIEW; DG C-ARM 1-60 MIN  COMPARISON:  Cervical spine CT dated 11/12/2012.  FINDINGS: 3 C arm views of the cervical spine demonstrate pedicle screw placement at the C2 through C7 levels. Endotracheal and nasogastric tubes are in place.  IMPRESSION: Pedicle screw placement at the C2 through C7 levels.   Electronically Signed   By: Gordan Payment M.D.   On: 11/19/2012 20:48   Dg C-arm 1-60 Min  11/19/2012   CLINICAL DATA:  C2-7 posterior cervical fusion.  EXAM: CERVICAL SPINE - 2-3 VIEW; DG C-ARM 1-60 MIN  COMPARISON:  Cervical spine CT dated 11/12/2012.  FINDINGS: 3 C arm views of the cervical spine demonstrate pedicle screw placement at the C2 through C7 levels. Endotracheal and nasogastric tubes are in place.  IMPRESSION: Pedicle screw placement at the C2 through C7 levels.   Electronically Signed   By: Gordan Payment M.D.   On: 11/19/2012 20:48    Assessment/Plan: Continue to mobilize with physical and occupational therapy will consult  inpatient rehabilitation as I feel like the patient could benefit from continued more aggressive intensive rehabilitation that home health would provide.  LOS: 2 days     Letzy Gullickson P 11/21/2012, 7:37 AM

## 2012-11-22 MED ORDER — OXYCODONE-ACETAMINOPHEN 10-325 MG PO TABS: 1.0000 | ORAL_TABLET | ORAL | Status: DC | PRN

## 2012-11-22 MED ORDER — CYCLOBENZAPRINE HCL 10 MG PO TABS: 10.0000 mg | ORAL_TABLET | Freq: Three times a day (TID) | ORAL | Status: DC | PRN

## 2012-11-22 MED ORDER — INFLUENZA VAC SPLIT QUAD 0.5 ML IM SUSP: 0.5000 mL | INTRAMUSCULAR | Status: DC

## 2012-11-22 NOTE — Discharge Summary (Signed)
Physician Discharge Summary  [name] MRN: [id number] DOB/AGE: 1970-05-26 42 y.o.  Admit date: 11/19/2012 Discharge date: 11/22/2012  Admission Diagnoses: Cervical stenosis, cervicalgia, cervical radiculopathy  Discharge Diagnoses: The same Active Problems:   * No active hospital problems. *   Discharged Condition: good  Hospital Course: Dr. Wynetta Emery performed a C2-C7 decompression, instrumentation, and fusion on the patient on 11/19/12.  The patient's postoperative course was unremarkable. On postop day #3 he requested discharge to home. The patient was given oral and written discharge instructions. All his questions were answered.  Consults: None Significant Diagnostic Studies: None Treatments: C2-C7 decompression, instrumentation, and fusion. Discharge Exam: Blood pressure 119/67, pulse 55, temperature 97.8 F (36.6 C), temperature source Oral, resp. rate 18, height 5\' 9"  (1.753 m), weight 104.781 kg (231 lb), SpO2 100.00%. Patient is alert and pleasant. He is moving all 4 extremities well. His dressing is clean and dry.  Disposition: Home  Discharge Orders   Future Orders Complete By Expires   Call MD for:  difficulty breathing, headache or visual disturbances  As directed    Call MD for:  extreme fatigue  As directed    Call MD for:  hives  As directed    Call MD for:  persistant dizziness or light-headedness  As directed    Call MD for:  persistant nausea and vomiting  As directed    Call MD for:  redness, tenderness, or signs of infection (pain, swelling, redness, odor or green/yellow discharge around incision site)  As directed    Call MD for:  severe uncontrolled pain  As directed    Call MD for:  temperature >100.4  As directed    Diet - low sodium heart healthy  As directed    Discharge instructions  As directed    Comments:     Call [phone] for a followup appointment. Take a stool softener while you are using pain medications.   Driving  Restrictions  As directed    Comments:     Do not drive for 2 weeks.   Increase activity slowly  As directed    Lifting restrictions  As directed    Comments:     Do not lift more than 5 pounds. No excessive bending or twisting.   May shower / Bathe  As directed    Comments:     He may shower after the pain she is removed 3 days after surgery. Leave the incision alone.   Remove dressing in 24 hours  As directed        Medication List    STOP taking these medications       methylPREDNISolone 4 MG tablet  Commonly known as:  MEDROL DOSEPAK     oxyCODONE-acetaminophen 5-325 MG per tablet  Commonly known as:  PERCOCET/ROXICET  Replaced by:  oxyCODONE-acetaminophen 10-325 MG per tablet      TAKE these medications       cyclobenzaprine 10 MG tablet  Commonly known as:  FLEXERIL  Take 1 tablet (10 mg total) by mouth 3 (three) times daily as needed for muscle spasms.     oxyCODONE-acetaminophen 10-325 MG per tablet  Commonly known as:  PERCOCET  Take 1 tablet by mouth every 4 (four) hours as needed for pain.     risperiDONE 0.25 MG tablet  Commonly known as:  RISPERDAL  Take 0.25 mg by mouth daily.     risperiDONE microspheres 37.5 MG injection  Commonly known as:  RISPERDAL CONSTA  Inject 37.5 mg into the muscle every 14 (fourteen) days.         SignedCristi Loron 11/22/2012, 10:36 AM

## 2012-11-22 NOTE — Progress Notes (Signed)
Pt discharged home as ordered, explained to pt the discharge summary,verbalized understanding of instructions; documents signed and copies given to pt, Left facility in good spirits in wheelchair accompanied by staff and family(cousin). No conerns/issues raised.

## 2012-11-22 NOTE — Progress Notes (Signed)
Clinical Child psychotherapist (CSW) received call from RN stating that patient would like to speak to a Child psychotherapist about disability. CSW met with patient and gave him the social security administration [place] address, phone number, and hours of operation. Patient reported that he is on disability and he needs to update them about a new problem. CSW encouraged patient to call the phone number and speak to his representative that his assigned to his case. Patient verbaized his understating. Please reconsult if further social work needs arise. CSW signing off.   Jetta Lout, LCSWA Weekend CSW [phone]

## 2012-11-22 NOTE — Progress Notes (Signed)
Occupational Therapy Treatment Patient Details Name: ARLAN BIRKS MRN: [id number] DOB: 16-Dec-1970 Today's Date: 11/22/2012 Time: 2130-8657 OT Time Calculation (min): 27 min  OT Assessment / Plan / Recommendation  History of present illness s/p c2-c7 posterior fusion   OT comments  Pt is knowledgeable in cervical precautions and donning and doffing c-collar.  Pt is modified independent in self care and mobility.  No further OT needs.  Follow Up Recommendations  No OT follow up    Barriers to Discharge       Equipment Recommendations  None recommended by OT    Recommendations for Other Services    Frequency     Progress towards OT Goals Progress towards OT goals: Goals met/education completed, patient discharged from OT  Plan Discharge plan remains appropriate    Precautions / Restrictions Precautions Precautions: Cervical Precaution Comments: reviewed precautions Required Braces or Orthoses: Cervical Brace Cervical Brace: Hard collar   Pertinent Vitals/Pain 7/10 neck, premedicated, VSS.    ADL  Grooming: Wash/dry hands;Wash/dry face;Modified independent Where Assessed - Grooming: Unsupported standing Upper Body Dressing: Modified independent Where Assessed - Upper Body Dressing: Unsupported sitting Lower Body Dressing: Modified independent Where Assessed - Lower Body Dressing: Unsupported sitting;Unsupported standing Toilet Transfer: Modified independent Toilet Transfer Method: Sit to Barista: Comfort height toilet Toileting - Clothing Manipulation and Hygiene: Modified independent Where Assessed - Toileting Clothing Manipulation and Hygiene: Sit to stand from 3-in-1 or toilet Transfers/Ambulation Related to ADLs: modified independent, no device around room and in hall ADL Comments: Good generalization of cervical precautions. Donned and doffed collar with supervision in front of mirror.    OT Diagnosis:    OT Problem List:   OT Treatment  Interventions:     OT Goals(current goals can now be found in the care plan section) Acute Rehab OT Goals Patient Stated Goal: to go home  Visit Information  Last OT Received On: 11/22/12 Assistance Needed: +1 History of Present Illness: s/p c2-c7 posterior fusion    Subjective Data      Prior Functioning       Cognition  Cognition Arousal/Alertness: Awake/alert Behavior During Therapy: WFL for tasks assessed/performed Overall Cognitive Status: Within Functional Limits for tasks assessed    Mobility  Bed Mobility Bed Mobility: Not assessed Transfers Sit to Stand: 6: Modified independent (Device/Increase time) Stand to Sit: 6: Modified independent (Device/Increase time)    Exercises      Balance     End of Session OT - End of Session Activity Tolerance: Patient tolerated treatment well Patient left:  (with PT)  GO     Evern Bio 11/22/2012, 11:33 AM [phone]

## 2012-11-22 NOTE — Progress Notes (Signed)
Physical Therapy Treatment Patient Details Name: Roger Woods MRN: [id number] DOB: April 24, 1970 Today's Date: 11/22/2012 Time: 6962-9528 PT Time Calculation (min): 16 min  PT Assessment / Plan / Recommendation  History of Present Illness s/p c2-c7 posterior fusion   PT Comments   Pt making great progress with stair education completed today.  Follow Up Recommendations  Home health PT     Equipment Recommendations  None recommended by PT    Recommendations for Other Services OT consult  Frequency Min 5X/week   Progress towards PT Goals Progress towards PT goals: Progressing toward goals  Plan Current plan remains appropriate    Precautions / Restrictions Precautions Precautions: Cervical Precaution Comments: reviewed precautions Required Braces or Orthoses: Cervical Brace Cervical Brace: Hard collar       Mobility  Bed Mobility Bed Mobility: Not assessed Details for Bed Mobility Assistance: pt oob walking in room with OT on arrival, to chair after session Transfers Sit to Stand: 6: Modified independent (Device/Increase time) Stand to Sit: 6: Modified independent (Device/Increase time);To chair/3-in-1;With armrests;With upper extremity assist Details for Transfer Assistance: increased time to perform Ambulation/Gait Ambulation/Gait Assistance: 5: Supervision;7: Independent Ambulation Distance (Feet): 800 Feet Assistive device: None Ambulation/Gait Assistance Details: cues initially for posture, none as gait progressed. Gait Pattern: Within Functional Limits Stairs: Yes Stairs Assistance: 5: Supervision Stairs Assistance Details (indicate cue type and reason): alternating pattern to ascend stairs and step to pattern to descend stairs Stair Management Technique: One rail Left;Step to pattern;Alternating pattern;Forwards Number of Stairs: 12      PT Goals (current goals can now be found in the care plan section) Acute Rehab PT Goals Patient Stated Goal: to go  home  Visit Information  Last PT Received On: 11/22/12 Assistance Needed: +1 History of Present Illness: s/p c2-c7 posterior fusion    Subjective Data  Patient Stated Goal: to go home   Cognition  Cognition Arousal/Alertness: Awake/alert Behavior During Therapy: WFL for tasks assessed/performed Overall Cognitive Status: Within Functional Limits for tasks assessed       End of Session PT - End of Session Equipment Utilized During Treatment: Gait belt Activity Tolerance: Patient tolerated treatment well Patient left: in chair;with call bell/phone within reach Nurse Communication: Mobility status   GP     Sallyanne Kuster 11/22/2012, 11:45 AM  Sallyanne Kuster, PTA Office- [phone]

## 2012-11-24 ENCOUNTER — Encounter (HOSPITAL_COMMUNITY): Payer: Self-pay | Admitting: Neurosurgery

## 2013-01-18 ENCOUNTER — Emergency Department (HOSPITAL_COMMUNITY)
Admission: EM | Admit: 2013-01-18 | Discharge: 2013-01-18 | Disposition: A | Discharge status: Home / Self Care | Payer: Medicaid Other | Attending: Emergency Medicine | Admitting: Emergency Medicine

## 2013-01-18 ENCOUNTER — Encounter (HOSPITAL_COMMUNITY): Payer: Self-pay | Admitting: Emergency Medicine

## 2013-01-18 DIAGNOSIS — Z880 Allergy status to penicillin: Secondary | ICD-10-CM | POA: Insufficient documentation

## 2013-01-18 DIAGNOSIS — Z981 Arthrodesis status: Secondary | ICD-10-CM | POA: Insufficient documentation

## 2013-01-18 DIAGNOSIS — M549 Dorsalgia, unspecified: Secondary | ICD-10-CM | POA: Insufficient documentation

## 2013-01-18 DIAGNOSIS — Z8781 Personal history of (healed) traumatic fracture: Secondary | ICD-10-CM | POA: Insufficient documentation

## 2013-01-18 DIAGNOSIS — K649 Unspecified hemorrhoids: Secondary | ICD-10-CM | POA: Insufficient documentation

## 2013-01-18 DIAGNOSIS — F17200 Nicotine dependence, unspecified, uncomplicated: Secondary | ICD-10-CM | POA: Insufficient documentation

## 2013-01-18 DIAGNOSIS — Z79899 Other long term (current) drug therapy: Secondary | ICD-10-CM | POA: Insufficient documentation

## 2013-01-18 DIAGNOSIS — R5383 Other fatigue: Secondary | ICD-10-CM

## 2013-01-18 DIAGNOSIS — F209 Schizophrenia, unspecified: Secondary | ICD-10-CM | POA: Insufficient documentation

## 2013-01-18 DIAGNOSIS — R5381 Other malaise: Secondary | ICD-10-CM | POA: Insufficient documentation

## 2013-01-18 DIAGNOSIS — M542 Cervicalgia: Secondary | ICD-10-CM | POA: Insufficient documentation

## 2013-01-18 DIAGNOSIS — R209 Unspecified disturbances of skin sensation: Secondary | ICD-10-CM | POA: Insufficient documentation

## 2013-01-18 MED ORDER — CYCLOBENZAPRINE HCL 10 MG PO TABS: 10.0000 mg | ORAL_TABLET | Freq: Two times a day (BID) | ORAL | Status: DC | PRN

## 2013-01-18 MED ORDER — OXYCODONE-ACETAMINOPHEN 10-325 MG PO TABS: 1.0000 | ORAL_TABLET | ORAL | Status: DC | PRN

## 2013-01-18 NOTE — ED Provider Notes (Signed)
CSN: [id number]     Arrival date & time 01/18/13  1743 History   First MD Initiated Contact with Patient 01/18/13 1755     This chart was scribed for non-physician practitioner, Rhea BleacherJosh Rexine Gowens PA-C working with Toy BakerAnthony T Allen, MD by Arlan OrganAshley Leger, ED Scribe. This patient was seen in room TR07C/TR07C and the patient's care was started at 5:56 PM.   Chief Complaint  Patient presents with  . Neck Pain   The history is provided by the patient. No language interpreter was used.    HPI Comments: Roger Woods is a 43 y.o. male with an aspen collar in place and a PSHx of a posterior cervical fusion/foraminotomy 12/01/12 who presents to the Emergency Department complaining of neck pain that initially started a few days ago. Pt  reported numbness and inablity to move his digits prior to his surgery. He now reports continued, constant, severe pain to upper extremities bilaterally and neck pain. He states lying down and certain positions worsens his pain. He has tried prescribed medications with mild temporary improvement. However, pt states the top of his pain medication bottle was misplaced, and reports the rest of his prescription falling down the drain recently. He reports still having the remaining last few pills of his muscle relaxer prescription. He denies any problems with his lower extremities. Pt reports a known allergy to penicillin. He plans to follow up with Dr. Wynetta Emeryram in 2 weeks.   Pt also reports pain to his hemorrhoids. He has tried OTC medication with no improvement. He admits to hard stools and straining while having BMs.  Surgery performed by Dr. Wynetta Emeryram.  Past Medical History  Diagnosis Date  . Schizophrenia   . Leg fracture, left   . Arm fracture, left   . Shortness of breath    Past Surgical History  Procedure Laterality Date  . Posterior cervical fusion/foraminotomy N/A 11/19/2012    Procedure: CERVICAL TWO TO CERVICAL SEVEN POSTERIOR CERVICAL FUSION/FORAMINOTOMY LEVEL 5;  Surgeon:  Mariam DollarGary P Cram, MD;  Location: [facility];  Service: Neurosurgery;  Laterality: N/A;  C2-7 posteior cervical arthrodesis with instrumentation   No family history on file. History  Substance Use Topics  . Smoking status: Current Every Day Smoker -- 1.00 packs/day for 7 years    Types: Cigarettes  . Smokeless tobacco: Never Used  . Alcohol Use: Yes     Comment: occasionally    Review of Systems  Constitutional: Negative for fever and unexpected weight change.  Gastrointestinal: Negative for constipation.       Neg for fecal incontinence  Genitourinary: Negative for hematuria, flank pain and difficulty urinating.       Negative for urinary incontinence or retention.  Musculoskeletal: Positive for back pain, myalgias and neck pain.  Neurological: Positive for weakness (unchanged). Negative for numbness.       Negative for saddle paresthesias     Allergies  Penicillins  Home Medications   Current Outpatient Rx  Name  Route  Sig  Dispense  Refill  . cyclobenzaprine (FLEXERIL) 10 MG tablet   Oral   Take 1 tablet (10 mg total) by mouth 3 (three) times daily as needed for muscle spasms.   50 tablet   1   . oxyCODONE-acetaminophen (PERCOCET) 10-325 MG per tablet   Oral   Take 1 tablet by mouth every 4 (four) hours as needed for pain.   100 tablet   0   . risperiDONE (RISPERDAL) 0.25 MG tablet   Oral  Take 0.25 mg by mouth daily.         . risperiDONE microspheres (RISPERDAL CONSTA) 37.5 MG injection   Intramuscular   Inject 37.5 mg into the muscle every 14 (fourteen) days.          Triage Vitals: BP 142/81  Pulse 100  Temp(Src) 98.8 F (37.1 C) (Oral)  Resp 20  Ht 5\' 9"  (1.753 m)  Wt 234 lb (106.142 kg)  BMI 34.54 kg/m2  SpO2 98%  Physical Exam  Nursing note and vitals reviewed. Constitutional: He is oriented to person, place, and time. He appears well-developed and well-nourished.  HENT:  Head: Normocephalic and atraumatic.  Eyes: Conjunctivae and EOM are  normal.  Neck: Normal range of motion.  Cardiovascular: Normal rate.   Pulmonary/Chest: Effort normal.  Abdominal: Soft. There is no tenderness. There is no CVA tenderness.  Musculoskeletal: Normal range of motion. He exhibits no tenderness.  No step-off noted with palpation of spine.   Neurological: He is alert and oriented to person, place, and time. He has normal reflexes. No sensory deficit. He exhibits normal muscle tone.  5/5 strength in entire lower extremities bilaterally. No sensation deficit upper or lower. 4+/5 strength of extensors of bilateral upper extremities.   Skin: Skin is warm and dry.  Psychiatric: He has a normal mood and affect. His behavior is normal.    ED Course  Procedures (including critical care time)  DIAGNOSTIC STUDIES: Oxygen Saturation is 98% on RA, Normal by my interpretation.    COORDINATION OF CARE: 6:14 PM- Will give pain medication. Discussed treatment plan with pt at bedside and pt agreed to plan.     Labs Review Labs Reviewed - No data to display Imaging Review No results found.  EKG Interpretation   None      6:37 PM Patient seen and examined.   Vital signs reviewed and are as follows: Filed Vitals:   01/18/13 1748  BP: 142/81  Pulse: 100  Temp: 98.8 F (37.1 C)  Resp: 20    No red flag s/s. Patient to follow instructions given by his neurosurgeon regarding his post-op course.   Patient prescribed muscle relaxer and counseled on proper use of muscle relaxant medication.    Patient prescribed narcotic pain medicine and counseled on proper use of narcotic pain medications. Counseled not to combine this medication with others containing tylenol.   He will need to follow-up with his neurosurgeon for further pain medication refill.   Urged patient not to drink alcohol, drive, or perform any other activities that requires focus while taking either of these medications.  Patient urged to follow-up with PCP if pain does not improve  with treatment and rest or if pain becomes recurrent. Urged to return with worsening severe pain, loss of bowel or bladder control, trouble walking.   The patient verbalizes understanding and agrees with the plan.   MDM   1. Neck pain    Patient with neck pain without any new neuro deficits, systemic symptoms including fever. Do not suspect any infection or post-op complication.     I have concerns that patient lost medications down the drain. He does not have other meds from sources per [place] substance reporting database. I have given him #8 tablets and have told him he will have to follow-up with his neurosurgeon.   I personally performed the services described in this documentation, which was scribed in my presence. The recorded information has been reviewed and is accurate.  Renne Crigler, PA-C  01/18/13 1843 

## 2013-01-18 NOTE — ED Notes (Addendum)
Pt presents with an aspen collar in place. Pt c/o neck pain ongoing for a couple of days. Pt had neck surgery on 11/19/2012. Pt also has a soft collar at home. Reports that aspen collar helps with pain. Pt had tried prescribed medications without relief. Pt reports top was off of his pain medications that got knocked into the sink and all the medication went down the drain. Pt still has muscle relaxers at home. Pt reports that his dad offered tylenol to him but, "I didn't want to take anything that is not prescribed to me because I am allergic to penicillin and could be allergic to that." Pt ambulatory in triage.

## 2013-01-18 NOTE — Discharge Instructions (Signed)
Please read and follow all provided instructions.  Your diagnoses today include:  1. Neck pain     Tests performed today include:  Vital signs - see below for your results today  Medications prescribed:   Percocet (oxycodone/acetaminophen) - narcotic pain medication  DO NOT drive or perform any activities that require you to be awake and alert because this medicine can make you drowsy. BE VERY CAREFUL not to take multiple medicines containing Tylenol (also called acetaminophen). Doing so can lead to an overdose which can damage your liver and cause liver failure and possibly death.   Flexeril (cyclobenzaprine) - muscle relaxer medication  DO NOT drive or perform any activities that require you to be awake and alert because this medicine can make you drowsy.   Take any prescribed medications only as directed.  Home care instructions:   Follow any educational materials contained in this packet  Please rest, use ice or heat on your neck and back for the next several days  Do not lift, push, pull anything more than 10 pounds for the next week  Follow-up instructions: Please follow-up with your primary care provider in the next 1 week for further evaluation of your symptoms. If you do not have a primary care doctor -- see below for referral information.   Return instructions:  SEEK IMMEDIATE MEDICAL ATTENTION IF YOU HAVE:  New numbness, tingling, weakness, or problem with the use of your arms or legs  Severe back pain not relieved with medications  Loss control of your bowels or bladder  Increasing pain in any areas of the body (such as chest or abdominal pain)  Shortness of breath, dizziness, or fainting.   Worsening nausea (feeling sick to your stomach), vomiting, fever, or sweats  Any other emergent concerns regarding your health   Additional Information:  Your vital signs today were: BP 142/81   Pulse 100   Temp(Src) 98.8 F (37.1 C) (Oral)   Resp 20   Ht 5\' 9"   (1.753 m)   Wt 234 lb (106.142 kg)   BMI 34.54 kg/m2   SpO2 98% If your blood pressure (BP) was elevated above 135/85 this visit, please have this repeated by your doctor within one month. --------------  Emergency Department Resource Guide 1) Find a Doctor and Pay Out of Pocket Although you won't have to find out who is covered by your insurance plan, it is a good idea to ask around and get recommendations. You will then need to call the office and see if the doctor you have chosen will accept you as a new patient and what types of options they offer for patients who are self-pay. Some doctors offer discounts or will set up payment plans for their patients who do not have insurance, but you will need to ask so you aren't surprised when you get to your appointment.  2) Contact Your Local Health Department Not all health departments have doctors that can see patients for sick visits, but many do, so it is worth a call to see if yours does. If you don't know where your local health department is, you can check in your phone book. The CDC also has a tool to help you locate your state's health department, and many state websites also have listings of all of their local health departments.  3) Find a Walk-in Clinic If your illness is not likely to be very severe or complicated, you may want to try a walk in clinic. These are popping up  all over the country in pharmacies, drugstores, and shopping centers. They're usually staffed by nurse practitioners or physician assistants that have been trained to treat common illnesses and complaints. They're usually fairly quick and inexpensive. However, if you have serious medical issues or chronic medical problems, these are probably not your best option.  No Primary Care Doctor: - Call Health Connect at  [phone] - they can help you locate a primary care doctor that  accepts your insurance, provides certain services, etc. - Physician Referral Service-  [phone]  Chronic Pain Problems: Organization         Address  Phone   Notes  [facility]  [phone] Patients need to be referred by their primary care doctor.   Medication Assistance: Organization         Address  Phone   Notes  [facility] [facility] [address] [place], [place] [address] [phone] --Must be a resident of [place] -- Must have NO insurance coverage whatsoever (no Medicaid/ Medicare, etc.) -- The pt. MUST have a primary care doctor that directs their care regularly and follows them in the community   [facility]ist  [phone]   [employer]  [phone]    Agencies that provide inexpensive medical care: Organization         Address  Phone   Notes  [facility]  [phone]   [facility]    [phone]   [facility] [address] [place], [place] [address] [phone]) [phone]   [facility] [place] [address], [place] [phone]   [facility]    [phone](336) [phone]   [facility]    [phone]   [facility]  [address], [place] Phone:  [phone], Fax:  [phone] Hours of Operation:  9 am - 6 pm, M-F.  Also accepts Medicaid/Medicare and self-pay.  [facility]  [address], [place] Phone: [phone], Fax: [phone]. Hours of Operation:  8:30 am - 5:30 pm, M-F.  Also accepts Medicaid and self-pay.  [facility] [place] [address], [place] Phone: [phone]   [facility] [address], [place], [place] [phone], Ext. [phone] Mondays & Thursdays: 7-9 AM.  First 15 patients are seen on a first come, first serve basis.    Medicaid-accepting [facility] [place] Providers:  Organization         Address  Phone   Notes  [facility] [address],  [place] [phone] Also accepts self-pay patients.  [facility] [address], [place]  [phone]   [facility] [address], [place] [phone]   [facility] [address] [place] [address], [place] [phone]) [phone]   Renaye RakersVeita Bland [address], [place]   [phone] Only accepts [place] patients after they have their name applied to their card.   Self-Pay (no insurance) in [facility]:  Organization         Address  Phone   Notes  Sickle Cell Patients, [facility] [address], [place] [phone]) [phone]   [facility] [address], [place] [phone]   [facility] [place]  [address] [place]  [address], [place] [phone]   °[facility]/Dr. Osei-Bonsu ° [address] [place] [address], [place] or [address], [place] [phone] Phone number for both [place] and [place] locations is the same.  °[facility] [address], [place] [phone]   °[facility] [address] [place] [address], [place] or [address] [phone] °([phone]   °[facility] [address], [place] [phone], phone; [phone], fax Sees patients 1st and 3rd Saturday of every month.  Must not qualify for public or private insurance (i.e. Medicaid, Medicare, [place] Health Choice, Veterans' Benefits) • Household income should be no more than 200% of the poverty level •The clinic cannot treat you if you are pregnant or think you are pregnant • Sexually transmitted diseases are not treated at the clinic.  ° ° °Dental Care: °Organization         Address  Phone  Notes  °[place] [facility] [address], [place] [phone] Accepts children up to age 21 who are enrolled in  Medicaid or [place] Health Choice; pregnant women with a Medicaid card; and children who have applied for Medicaid or [place] Health Choice, but were declined, whose parents can pay a reduced fee at time of service.  °[place] [facility] [place]  [address], [place] [phone]) [phone] Accepts children up to age 21 who are enrolled in Medicaid or [place] Health Choice; pregnant women with a Medicaid card; and children who have applied for Medicaid or [place] Health Choice, but were declined, whose parents can pay a reduced fee at time of service.  °[facility] ° [address], [place] [phone] Patients are seen by appointment only. Walk-ins are not accepted. [facility] will see patients 18 years of age and older. °Monday - Tuesday (8am-5pm) °Most Wednesdays (8:30-5pm) °$30 per visit, cash only  °[facility] ° [address], [place] [phone]) [phone] Patients are seen by appointment only. Walk-ins are not accepted. [facility] will see patients 18 years of age and older. °One Wednesday Evening (Monthly: Volunteer Based).  $30 per visit, cash only  °[facility]  [phone] for adults; Children under age 4, call Graduate Pediatric Dentistry at [phone]. Children aged 4-14, please call [phone] to request a pediatric application. ° Dental services are provided in all areas of dental care including fillings, crowns and bridges, complete and partial dentures, implants, gum treatment, root canals, and extractions. Preventive care is also provided. Treatment is provided to both adults and children. °Patients are selected via a lottery and there is often a waiting list. °  °[facility] [address], ° ° ([phone]) [phone] [email or web address].drcivils.com °  °[facility] [address], [place], [place] [phone], Ext. [phone] Second and Fourth Thursday of each month, opens at 6:30  AM; Clinic ends at 9 AM.  Patients are seen on a first-come first-served basis, and a limited number are seen during each clinic.  ° °[facility] ° [address], [place], [place] [phone]) [phone]   Eligibility Requirements °You must have lived in [place], [place], or [place] counties for at least the last three months. °  You cannot be eligible for state or federal sponsored healthcare insurance, including Veterans Administration, Medicaid, or Medicare. °    You generally cannot be eligible for healthcare insurance through your employer.  °  How to apply: °Eligibility screenings are held every Tuesday and Wednesday afternoon from 1:00 pm until 4:00 pm. You do not need an appointment for the interview!  °[facility] [address], [place], [place] [phone]   °[place] [facility]  336-342-8273   °[place]  336-703-3100   °[facility]  336-570-6415   ° °Behavioral Health Resources in the Community: °Intensive Outpatient Programs °Organization         Address  Phone  Notes  °[facility] [address], [place], [place] [phone]   °[facility] [address], [place], [place] 336-832-9800   °[facility] [address], [place], [place] ° 336-882-2125   °[facility] [address],  °[place], [place] 1-800-853-5163 or 336-641-4981   °Substance Abuse Resources °Organization         Address  Phone  Notes  °[facility]  336-882-2125   °[facility]  336-784-9470   °[facility]  336-285-9073   °[facility]  336-845-3988   °Residential & Outpatient Substance Abuse Program  1-800-659-3381   °Psychological Services °Organization         Address  Phone  Notes  °[facility]  336- 832-9600   °[facility]  336- 378-7881   °[facility] [address], [place] 1-800-853-5163 or  336-641-4981   ° °Mobile Crisis Teams °Organization         Address  Phone  Notes  °[facility], Mobile Crisis Care Unit  1-877-626-1772   °Assertive °[facility] ° [address] [place], [place] 336-834-9664   °Sharon DeEsch [address] °[place] 336-554-5454   ° °Self-Help/Support Groups °Organization         Address  Phone             Notes  °[facility] [place] - variety of support groups  336- 373-1402 Call for more information  °Narcotics Anonymous (NA), [facility] [address], °[place]  2 meetings at this location  ° °Residential Treatment Programs °Organization         Address  Phone  Notes  °[facility] [address],    °[place]  1-866-801-8205   °[facility] ° [address], [place], [place] 704-293-8524   °[facility] [address], [place] 336-845-3988 Admissions: 8am-3pm M-F  °[facility] [address]    °[place], [place] 336-841-1104   °[facility] [address], [place], [place] 336-379-7146   °[facility] [address]  °[place], [place] 336-285-9073   °[facility] - Intensive Outpatient [address], [place], [place] 336-852-3033   °[facility] ([facility] [address]  °[place], [place] 1-877-615-2722 or 336-784-9470   °[facility] ([facility]) [address] [place], [place] 336-227-7417 Accepts Medicaid  °[facility] [address]  ° [place] 1-800-659-3381 Substance Abuse/Addiction Treatment  ° °[place] Behavioral Health Resources °Organization         Address  Phone  Notes  °[facility]  (888) 581-9988   °Julie Brannon, PhD [address] [place], [place]   (336) 349-5553 or (336) 951-0000   °[facility]   [address] [place], [place] 256-619-5501   [facility] [address]  [place], [place] [phone]) [phone] Insurance/Medicaid/sponsorship through [facility] Families [address]                                    [place], [place] [phone] Therapy/tele-psych/case  [facility] [address]   [place], [place] [phone]    Dr. Lolly Mustache  [phone]   [facility]  [employer] [facility] Dept. 1) [address] [place] 2) [address] [place] 3)  [address], [place] [phone]  [phone]   [facility] Child Abuse Hotline 808-832-0526 or [phone])[phone] (After Hours)

## 2013-01-21 NOTE — ED Provider Notes (Addendum)
Medical screening examination/treatment/procedure(s) were performed by non-physician practitioner and as supervising physician I was immediately available for consultation/collaboration.  EKG Interpretation   None       EKG Interpretation   None        Toy BakerAnthony T Dayton Sherr, MD 01/21/13 1939  Toy BakerAnthony T Tauno Falotico, MD 02/06/13 [phone]

## 2013-02-18 ENCOUNTER — Other Ambulatory Visit: Payer: Self-pay | Admitting: Neurosurgery

## 2013-02-18 DIAGNOSIS — M4712 Other spondylosis with myelopathy, cervical region: Secondary | ICD-10-CM

## 2013-02-23 ENCOUNTER — Ambulatory Visit
Admission: RE | Admit: 2013-02-23 | Discharge: 2013-02-23 | Disposition: A | Discharge status: Home / Self Care | Payer: Medicaid Other | Source: Ambulatory Visit | Attending: Neurosurgery | Admitting: Neurosurgery

## 2013-02-23 DIAGNOSIS — M4712 Other spondylosis with myelopathy, cervical region: Secondary | ICD-10-CM

## 2013-03-31 ENCOUNTER — Telehealth: Payer: Self-pay | Admitting: Neurology

## 2013-03-31 NOTE — Telephone Encounter (Signed)
I called and left a message for the patient about his appointment. I informed the patient that I called [facility] to get the PCP authorization per [place] requirement and they stated the patient last appointment was in 2012 and he "No Show." I informed the patient that he would need to call his PCP and make an appointment, so that I can schedule his appointment with [facility].

## 2013-04-20 ENCOUNTER — Institutional Professional Consult (permissible substitution): Payer: Medicaid Other | Admitting: Neurology

## 2013-04-24 ENCOUNTER — Ambulatory Visit (INDEPENDENT_AMBULATORY_CARE_PROVIDER_SITE_OTHER): Payer: Medicaid Other | Admitting: Neurology

## 2013-04-24 ENCOUNTER — Other Ambulatory Visit: Payer: Self-pay | Admitting: *Deleted

## 2013-04-24 ENCOUNTER — Encounter: Payer: Self-pay | Admitting: Neurology

## 2013-04-24 ENCOUNTER — Encounter (INDEPENDENT_AMBULATORY_CARE_PROVIDER_SITE_OTHER): Payer: Self-pay

## 2013-04-24 VITALS — BP 138/90 | HR 72 | Resp 18 | Ht 70.5 in | Wt 243.0 lb

## 2013-04-24 DIAGNOSIS — G4730 Sleep apnea, unspecified: Principal | ICD-10-CM

## 2013-04-24 DIAGNOSIS — G4710 Hypersomnia, unspecified: Secondary | ICD-10-CM | POA: Insufficient documentation

## 2013-04-24 DIAGNOSIS — F1190 Opioid use, unspecified, uncomplicated: Secondary | ICD-10-CM | POA: Insufficient documentation

## 2013-04-24 DIAGNOSIS — M4712 Other spondylosis with myelopathy, cervical region: Secondary | ICD-10-CM

## 2013-04-24 DIAGNOSIS — Q761 Klippel-Feil syndrome: Secondary | ICD-10-CM

## 2013-04-24 DIAGNOSIS — M47812 Spondylosis without myelopathy or radiculopathy, cervical region: Secondary | ICD-10-CM

## 2013-04-24 DIAGNOSIS — F1110 Opioid abuse, uncomplicated: Secondary | ICD-10-CM

## 2013-04-24 MED ORDER — SUVOREXANT 10 MG PO TABS: 10.0000 mg | ORAL_TABLET | Freq: Every evening | ORAL | Status: DC

## 2013-04-24 NOTE — Progress Notes (Signed)
[facility]EP [facility]ICINE [facility]IC  Provider:  Melvyn Novasarmen  Ayslin Kundert, M D  Referring Provider: Ivan CroftBartko, Albert K II, MD Primary Care Physician:  No PCP Per Patient  Chief Complaint  Patient presents with  . New Evaluation    Room 11  . Sleep consult    HPI:  Roger [name]. He underwent neck surgery ( posterior fusion in November 2014 , Dr. Wynetta Emeryram  ) and stated his neck is still painful, keeping him from restful sleep.  He  is seen here as a referral  For a sleep study evaluation from Dr. Murray HodgkinsBartko to assure safety with narcotic medication use.   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 The hardware, namely screws seem to had initially not along well into the palm. He Nor did the typical neck and shoulder girdle pain and paresthesias into the upper extremity that would be associated with a cervical myelopathy. 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  He has not developed numbness,  weakness or bowel or. bladder incontinence but remains in pain at the scale of 10 endorsing 10 points.  He has a history of schizophrenia - psychosis, which is well controlled on Risperdal- he denies any desire or vivid dreams or hallucinations when on the pain medication, namely Percocet.  The pain level is reduced to a 6/10 with significant improvement in function after taking the medication and he has been on 6 tablets per day.   Dr. Murray HodgkinsBartko is no other cause of changing him to a sustained-release opioid. He has been snoring and witnessed to snore Dr. Huntley DecParke ordered a sleep study to make sure that the patient is safe from apnea while on narcotic pain medication.   He has also prescribed Neurontin to help him this neuropathic pain at bedtime and to improve the insomnia. . This will be titrated to 600 milligrams still. Opiate  pain medications are prescribed through other physicians.   Review of Systems: Out of a complete 14 system review, the patient complains of only the following symptoms, and all other reviewed systems are negative. Pain, pain, pain. Loss of right hnd grip strength, tenderness over the neck and shoulder.  Index finger on the right hand is weak, numb and tight.  Insomnia. FSS 54,  [place] not filled, GDS not filled.     History   Social History  . Marital Status: Single    Spouse Name: N/A    Number of Children: 7  . Years of Education: GED   Occupational History  . Not on file.   Social History Main Topics  . Smoking status: Former Smoker -- 1.00 packs/day for 7 years    Types: Cigarettes    Quit date: 11/20/2011  . Smokeless tobacco: Never Used  . Alcohol Use: No  . Drug Use: No  . Sexual Activity: Not on file   Other Topics Concern  . Not on file   Social History Narrative   Patient is single and lives alone.   Patient has 7 children.   Patient is disabled.   Patient has his GED.   Patient is right-handed.   Patient drinks some caffeine but not everyday.    Family History  Problem Relation Age of Onset  . High blood pressure Maternal Grandmother  Past Medical History  Diagnosis Date  . Schizophrenia   . Leg fracture, left   . Arm fracture, left   . Shortness of breath   . Psychosis   . Insomnia due to medical condition   . Chronic pain     Past Surgical History  Procedure Laterality Date  . Posterior cervical fusion/foraminotomy N/A 11/19/2012    Procedure: CERVICAL TWO TO CERVICAL SEVEN POSTERIOR CERVICAL FUSION/FORAMINOTOMY LEVEL 5;  Surgeon: Mariam DollarGary P Cram, MD;  Location: [facility];  Service: Neurosurgery;  Laterality: N/A;  C2-7 posteior cervical arthrodesis with instrumentation    Current Outpatient Prescriptions  Medication Sig Dispense Refill  . cyclobenzaprine (FLEXERIL) 10 MG tablet Take 1 tablet (10 mg total) by mouth 2 (two) times daily  as needed for muscle spasms.  14 tablet  0  . Oxycodone HCl 20 MG TABS Take 1 tablet by mouth 3 (three) times daily.      . risperiDONE microspheres (RISPERDAL CONSTA) 37.5 MG injection Inject 37.5 mg into the muscle every 14 (fourteen) days.      . Suvorexant (BELSOMRA) 10 MG TABS Take 10 mg by mouth Nightly.  3 tablet  0   No current facility-administered medications for this visit.    Allergies as of 04/24/2013 - Review Complete 04/24/2013  Allergen Reaction Noted  . Penicillins Other (See Comments) 01/30/2012    Vitals: BP 138/90  Pulse 72  Resp 18  Ht 5' 10.5" (1.791 m)  Wt 243 lb (110.224 kg)  BMI 34.36 kg/m2 Last Weight:  Wt Readings from Last 1 Encounters:  04/24/13 243 lb (110.224 kg)   Last Height:   Ht Readings from Last 1 Encounters:  04/24/13 5' 10.5" (1.791 m)    Physical exam:  General: The patient is awake, and appears  in distress. The patient is well groomed. Head: Normocephalic, atraumatic. Neck is supple. Mallampati 4 , neck circumference: 19 inches.  No  retrognathia.  Cardiovascular:  Regular rate and rhythm, without  murmurs or carotid bruit, and without distended neck veins. Respiratory: Lungs are clear to auscultation. Skin:  Without evidence of edema, or rash Trunk: BMI is  elevated and patient  has normal posture.  Neurologic exam : The patient is awake and alert, oriented to place and time.  Memory subjective  described as intact. There is a normal attention span & concentration ability.  Speech is fluent without dysarthria, dysphonia or aphasia. Mood and affect are depressed, fatigued.  Cranial nerves: Pupils are dilated -equal and briskly reactive to light.  Extraocular movements  in vertical and horizontal planes intact and without nystagmus. Visual fields by finger perimetry are intact. Hearing to finger rub intact.  Facial sensation intact to fine touch. Facial motor strength is symmetric and tongue and uvula move midline.right shoulder is  droopier than left.    Motor exam:  Normal tone and  muscle bulk and symmetric normal strength in all extremities.  Sensory:  Fine touch, pinprick and vibration were tested in all extremities. Proprioception is normal.  Coordination: Rapid alternating movements in the fingers/hands is tested and normal.  Gait and station: Patient walks without assistive device, Gait strength within normal limits. Stance is stable and normal. Tandem gait deferred.  Deep tendon reflexes: in the  upper and lower extremities are symmetric and intact.   Assessment:  After physical and neurologic examination, review of laboratory studies, imaging, neurophysiology testing and pre-existing records, assessment is   1) The patient will be evaluated for OSA versus CSA in  a split night study, 4% medicaid , AHI 15.  co2 check.  To allow for sleep, the patient will bring his gabapentin/ Neurontin to the sleep lab.    Plan:  SPLIT. I will give the patient a sample of a sleep aid( belsomra ), that can be used if he has trouble initiating sleep .he doesn't tolerate sleeping in bed due to neck pain.  He may benefit from a hospital bed or sleeping in a recliner.

## 2013-04-24 NOTE — Patient Instructions (Signed)
Polysomnography (Sleep Studies) Polysomnography (PSG) is a series of tests used for detecting (diagnosing) obstructive sleep apnea and other sleep disorders. The tests measure how some parts of your body are working while you are sleeping. The tests are extensive and expensive. They are done in a sleep lab or hospital, and vary from center to center. Your caregiver may perform other more simple sleep studies and questionnaires before doing more complete and involved testing. Testing may not be covered by insurance. Some of these tests are:  An EEG (Electroencephalogram). This tests your brain waves and stages of sleep.  An EOG (Electrooculogram). This measures the movements of your eyes. It detects periods of REM (rapid eye movement) sleep, which is your dream sleep.  An EKG (Electrocardiogram). This measures your heart rhythm.  EMG (Electromyography). This is a measurement of how the muscles are working in your upper airway and your legs while sleeping.  An oximetry measurement. It measures how much oxygen (air) you are getting while sleeping.  Breathing efforts may be measured. The same test can be interpreted (understood) differently by different caregivers and centers that study sleep.  Studies may be given an apnea/hypopnea index (AHI). This is a number which is found by counting the times of no breathing or under breathing during the night, and relating those numbers to the amount of time spent in bed. When the AHI is greater than 15, the patient is likely to complain of daytime sleepiness. When the AHI is greater than 30, the patient is at increased risk for heart problems and must be followed more closely. Following the AHI also allows you to know how treatment is working. Simple oximetry (tracking the amount of oxygen that is taken in) can be used for screening patients who:  Do not have symptoms (problems) of OSA.  Have a normal Epworth Sleepiness Scale Score.  Have a low pre-test  probability of having OSA.  Have none of the upper airway problems likely to cause apnea.  Oximetry is also used to determine if treatment is effective in patients who showed significant desaturations (not getting enough oxygen) on their home sleep study. One extra measure of safety is to perform additional studies for the person who only snores. This is because no one can predict with absolute certainty who will have OSA. Those who show significant desaturations (not getting enough oxygen) are recommended to have a more detailed sleep study. Document Released: 07/01/2002 Document Revised: 03/19/2011 Document Reviewed: 12/25/2004 [employer] Patient Information 2014 [employer].  

## 2013-05-06 ENCOUNTER — Other Ambulatory Visit: Payer: Self-pay | Admitting: Neurosurgery

## 2013-05-06 DIAGNOSIS — M4712 Other spondylosis with myelopathy, cervical region: Secondary | ICD-10-CM

## 2013-05-21 ENCOUNTER — Inpatient Hospital Stay: Admission: RE | Admit: 2013-05-21 | Payer: Medicaid Other | Source: Ambulatory Visit

## 2013-05-29 ENCOUNTER — Ambulatory Visit
Admission: RE | Admit: 2013-05-29 | Discharge: 2013-05-29 | Disposition: A | Discharge status: Home / Self Care | Payer: Medicaid Other | Source: Ambulatory Visit | Attending: Neurosurgery | Admitting: Neurosurgery

## 2013-05-29 DIAGNOSIS — M4712 Other spondylosis with myelopathy, cervical region: Secondary | ICD-10-CM

## 2013-06-02 ENCOUNTER — Other Ambulatory Visit: Payer: Self-pay | Admitting: Neurosurgery

## 2013-06-09 ENCOUNTER — Ambulatory Visit: Payer: Medicaid Other | Attending: Neurosurgery

## 2013-06-17 ENCOUNTER — Encounter (HOSPITAL_COMMUNITY)
Admission: RE | Admit: 2013-06-17 | Discharge: 2013-06-17 | Disposition: A | Discharge status: Home / Self Care | Payer: Medicaid Other | Source: Ambulatory Visit | Attending: Neurosurgery | Admitting: Neurosurgery

## 2013-06-17 ENCOUNTER — Encounter (HOSPITAL_COMMUNITY): Payer: Self-pay

## 2013-06-17 DIAGNOSIS — Z01812 Encounter for preprocedural laboratory examination: Secondary | ICD-10-CM | POA: Insufficient documentation

## 2013-06-17 HISTORY — DX: Unspecified hemorrhoids: K64.9

## 2013-06-17 HISTORY — DX: Weakness: R53.1

## 2013-06-17 HISTORY — DX: Headache: R51

## 2013-06-17 LAB — BASIC METABOLIC PANEL
BUN: 11 mg/dL (ref 6–23)
CALCIUM: 9.8 mg/dL (ref 8.4–10.5)
CO2: 22 meq/L (ref 19–32)
Chloride: 102 mEq/L (ref 96–112)
Creatinine, Ser: 0.95 mg/dL (ref 0.50–1.35)
GFR calc Af Amer: >90 mL/min (ref >90)
Glucose, Bld: 100 mg/dL — ABNORMAL HIGH (ref 70–99)
Potassium: 4.3 mEq/L (ref 3.7–5.3)
Sodium: 140 mEq/L (ref 137–147)

## 2013-06-17 LAB — CBC
HEMATOCRIT: 44.1 % (ref 39.0–52.0)
HEMOGLOBIN: 14.7 g/dL (ref 13.0–17.0)
MCH: 29.2 pg (ref 26.0–34.0)
MCHC: 33.3 g/dL (ref 30.0–36.0)
MCV: 87.5 fL (ref 78.0–100.0)
Platelets: 224 10*3/uL (ref 150–400)
RBC: 5.04 MIL/uL (ref 4.22–5.81)
RDW: 13.6 % (ref 11.5–15.5)
WBC: 6.2 10*3/uL (ref 4.0–10.5)

## 2013-06-17 LAB — SURGICAL PCR SCREEN
MRSA, PCR: NEGATIVE
Staphylococcus aureus: NEGATIVE

## 2013-06-17 NOTE — Pre-Procedure Instructions (Signed)
Roger Woods  06/17/2013   Your procedure is scheduled on:  Wed, June 17 @ 9:30 AM  Report to [facility]  at 7:30 AM.  Call this number if you have problems the morning of surgery: [phone]   Remember:   Do not eat food or drink liquids after midnight.   Take these medicines the morning of surgery with A SIP OF WATER: Pain Pill(if needed)               No Goody's,BC's,Aleve,Aspirin,Ibuprofen,Fish Oil,or any Herbal Medications   Do not wear jewelry  Do not wear lotions, powders, or colognes. You may wear deodorant.  Men may shave face and neck.  Do not bring valuables to the hospital.  [facility] is not responsible                  for any belongings or valuables.               Contacts, dentures or bridgework may not be worn into surgery.  Leave suitcase in the car. After surgery it may be brought to your room.  For patients admitted to the hospital, discharge time is determined by your                treatment team.               Patients discharged the day of surgery will not be allowed to drive  home.    Special Instructions:  [place] - Preparing for Surgery  Before surgery, you can play an important role.  Because skin is not sterile, your skin needs to be as free of germs as possible.  You can reduce the number of germs on you skin by washing with CHG (chlorahexidine gluconate) soap before surgery.  CHG is an antiseptic cleaner which kills germs and bonds with the skin to continue killing germs even after washing.  Please DO NOT use if you have an allergy to CHG or antibacterial soaps.  If your skin becomes reddened/irritated stop using the CHG and inform your nurse when you arrive at Short Stay.  Do not shave (including legs and underarms) for at least 48 hours prior to the first CHG shower.  You may shave your face.  Please follow these instructions carefully:   1.  Shower with CHG Soap the night before surgery and the                                 morning of Surgery.  2.  If you choose to wash your hair, wash your hair first as usual with your       normal shampoo.  3.  After you shampoo, rinse your hair and body thoroughly to remove the                      Shampoo.  4.  Use CHG as you would any other liquid soap.  You can apply chg directly       to the skin and wash gently with scrungie or a clean washcloth.  5.  Apply the CHG Soap to your body ONLY FROM THE NECK DOWN.        Do not use on open wounds or open sores.  Avoid contact with your eyes,       ears, mouth and genitals (private parts).  Wash genitals (private parts)  with your normal soap.  6.  Wash thoroughly, paying special attention to the area where your surgery        will be performed.  7.  Thoroughly rinse your body with warm water from the neck down.  8.  DO NOT shower/wash with your normal soap after using and rinsing off       the CHG Soap.  9.  Pat yourself dry with a clean towel.            10.  Wear clean pajamas.            11.  Place clean sheets on your bed the night of your first shower and do not        sleep with pets.  Day of Surgery  Do not apply any lotions/deoderants the morning of surgery.  Please wear clean clothes to the hospital/surgery center.     Please read over the following fact sheets that you were given: Pain Booklet, Coughing and Deep Breathing, MRSA Information and Surgical Site Infection Prevention

## 2013-06-17 NOTE — Progress Notes (Signed)
Sleep study not complete but has started it

## 2013-06-17 NOTE — Progress Notes (Signed)
06/17/13 1416  OBSTRUCTIVE SLEEP APNEA  Have you ever been diagnosed with sleep apnea through a sleep study? No  Do you snore loudly (loud enough to be heard through closed doors)?  1  Do you often feel tired, fatigued, or sleepy during the daytime? 0  Has anyone observed you stop breathing during your sleep? 1  Do you have, or are you being treated for high blood pressure? 0  BMI more than 35 kg/m2? 0  Age over 43 years old? 0  Neck circumference greater than 40 cm/16 inches? 1 (19)  Gender: 1  Obstructive Sleep Apnea Score 4  Score 4 or greater  Results sent to PCP

## 2013-06-17 NOTE — Progress Notes (Addendum)
Pt doesn't have a cardiologist  Denies ever having an echo/stress test/heart cath  [facility](was form[facility]) but hasn't been seen since 2012   EKG in epic from 11-12-12

## 2013-06-23 NOTE — Progress Notes (Signed)
Pt notified of time change;to arrive at 0630---verbalized understanding 

## 2013-06-24 ENCOUNTER — Encounter (HOSPITAL_COMMUNITY): Payer: Medicaid Other | Admitting: Certified Registered Nurse Anesthetist

## 2013-06-24 ENCOUNTER — Ambulatory Visit (HOSPITAL_COMMUNITY): Payer: Medicaid Other | Admitting: Certified Registered Nurse Anesthetist

## 2013-06-24 ENCOUNTER — Inpatient Hospital Stay (HOSPITAL_COMMUNITY)
Admission: RE | Admit: 2013-06-24 | Discharge: 2013-06-26 | DRG: 473 | Disposition: A | Discharge status: Home / Self Care | Payer: Medicaid Other | Source: Ambulatory Visit | Attending: Neurosurgery | Admitting: Neurosurgery

## 2013-06-24 ENCOUNTER — Encounter (HOSPITAL_COMMUNITY): Payer: Self-pay | Admitting: *Deleted

## 2013-06-24 ENCOUNTER — Encounter (HOSPITAL_COMMUNITY)
Admission: RE | Disposition: A | Discharge status: Home / Self Care | Payer: Self-pay | Source: Ambulatory Visit | Attending: Neurosurgery

## 2013-06-24 ENCOUNTER — Ambulatory Visit (HOSPITAL_COMMUNITY): Payer: Medicaid Other

## 2013-06-24 DIAGNOSIS — F17200 Nicotine dependence, unspecified, uncomplicated: Secondary | ICD-10-CM | POA: Diagnosis present

## 2013-06-24 DIAGNOSIS — F29 Unspecified psychosis not due to a substance or known physiological condition: Secondary | ICD-10-CM | POA: Diagnosis present

## 2013-06-24 DIAGNOSIS — T84498A Other mechanical complication of other internal orthopedic devices, implants and grafts, initial encounter: Principal | ICD-10-CM | POA: Diagnosis present

## 2013-06-24 DIAGNOSIS — G8929 Other chronic pain: Secondary | ICD-10-CM | POA: Diagnosis present

## 2013-06-24 DIAGNOSIS — F209 Schizophrenia, unspecified: Secondary | ICD-10-CM | POA: Diagnosis present

## 2013-06-24 DIAGNOSIS — Y831 Surgical operation with implant of artificial internal device as the cause of abnormal reaction of the patient, or of later complication, without mention of misadventure at the time of the procedure: Secondary | ICD-10-CM | POA: Diagnosis present

## 2013-06-24 DIAGNOSIS — G4700 Insomnia, unspecified: Secondary | ICD-10-CM | POA: Diagnosis present

## 2013-06-24 DIAGNOSIS — Z880 Allergy status to penicillin: Secondary | ICD-10-CM

## 2013-06-24 DIAGNOSIS — Z6834 Body mass index (BMI) 34.0-34.9, adult: Secondary | ICD-10-CM

## 2013-06-24 DIAGNOSIS — S129XXA Fracture of neck, unspecified, initial encounter: Secondary | ICD-10-CM | POA: Diagnosis present

## 2013-06-24 HISTORY — PX: HARDWARE REMOVAL: SHX979

## 2013-06-24 SURGERY — REMOVAL, HARDWARE
Anesthesia: General | Site: Spine Cervical

## 2013-06-24 MED ORDER — VANCOMYCIN HCL IN DEXTROSE 1-5 GM/200ML-% IV SOLN
1000.0000 mg | Freq: Three times a day (TID) | INTRAVENOUS | Status: DC
  Administered 2013-06-24 – 2013-06-26 (×5): 1000 mg via INTRAVENOUS
  Filled 2013-06-24 (×7): qty 200

## 2013-06-24 MED ORDER — ACETAMINOPHEN 325 MG PO TABS: 650.0000 mg | ORAL_TABLET | ORAL | Status: DC | PRN

## 2013-06-24 MED ORDER — NEOSTIGMINE METHYLSULFATE 10 MG/10ML IV SOLN
INTRAVENOUS | Status: DC | PRN
  Administered 2013-06-24: 4 mg via INTRAVENOUS

## 2013-06-24 MED ORDER — LACTATED RINGERS IV SOLN
INTRAVENOUS | Status: DC | PRN
  Administered 2013-06-24: 08:00:00 via INTRAVENOUS

## 2013-06-24 MED ORDER — FENTANYL CITRATE 0.05 MG/ML IJ SOLN
INTRAMUSCULAR | Status: DC | PRN
  Administered 2013-06-24 (×2): 50 ug via INTRAVENOUS
  Administered 2013-06-24: 100 ug via INTRAVENOUS
  Administered 2013-06-24 (×6): 50 ug via INTRAVENOUS

## 2013-06-24 MED ORDER — DEXAMETHASONE SODIUM PHOSPHATE 10 MG/ML IJ SOLN
INTRAMUSCULAR | Status: DC | PRN
  Administered 2013-06-24: 10 mg via INTRAVENOUS

## 2013-06-24 MED ORDER — SODIUM CHLORIDE 0.9 % IV SOLN
10.0000 mg | INTRAVENOUS | Status: DC | PRN
  Administered 2013-06-24: 10 ug/min via INTRAVENOUS

## 2013-06-24 MED ORDER — OXYCODONE HCL 5 MG PO TABS
ORAL_TABLET | ORAL | Status: AC
  Filled 2013-06-24: qty 1

## 2013-06-24 MED ORDER — 0.9 % SODIUM CHLORIDE (POUR BTL) OPTIME
TOPICAL | Status: DC | PRN
  Administered 2013-06-24: 1000 mL

## 2013-06-24 MED ORDER — SODIUM CHLORIDE 0.9 % IR SOLN
Status: DC | PRN
  Administered 2013-06-24: 09:00:00

## 2013-06-24 MED ORDER — PROPOFOL 10 MG/ML IV BOLUS
INTRAVENOUS | Status: AC
  Filled 2013-06-24: qty 20

## 2013-06-24 MED ORDER — OXYCODONE-ACETAMINOPHEN 5-325 MG PO TABS
ORAL_TABLET | ORAL | Status: AC
  Filled 2013-06-24: qty 1

## 2013-06-24 MED ORDER — LIDOCAINE HCL (CARDIAC) 20 MG/ML IV SOLN
INTRAVENOUS | Status: AC
  Filled 2013-06-24: qty 5

## 2013-06-24 MED ORDER — VECURONIUM BROMIDE 10 MG IV SOLR
INTRAVENOUS | Status: AC
  Filled 2013-06-24: qty 10

## 2013-06-24 MED ORDER — ROCURONIUM BROMIDE 100 MG/10ML IV SOLN
INTRAVENOUS | Status: DC | PRN
  Administered 2013-06-24: 30 mg via INTRAVENOUS
  Administered 2013-06-24: 20 mg via INTRAVENOUS

## 2013-06-24 MED ORDER — VANCOMYCIN HCL IN DEXTROSE 1-5 GM/200ML-% IV SOLN
INTRAVENOUS | Status: AC
  Filled 2013-06-24: qty 200

## 2013-06-24 MED ORDER — PHENOL 1.4 % MT LIQD: 1.0000 | OROMUCOSAL | Status: DC | PRN

## 2013-06-24 MED ORDER — LIDOCAINE HCL (CARDIAC) 20 MG/ML IV SOLN
INTRAVENOUS | Status: DC | PRN
  Administered 2013-06-24: 80 mg via INTRAVENOUS

## 2013-06-24 MED ORDER — GLYCOPYRROLATE 0.2 MG/ML IJ SOLN
INTRAMUSCULAR | Status: DC | PRN
  Administered 2013-06-24: 0.6 mg via INTRAVENOUS

## 2013-06-24 MED ORDER — ROCURONIUM BROMIDE 50 MG/5ML IV SOLN
INTRAVENOUS | Status: AC
  Filled 2013-06-24: qty 1

## 2013-06-24 MED ORDER — ONDANSETRON HCL 4 MG/2ML IJ SOLN: 4.0000 mg | INTRAMUSCULAR | Status: DC | PRN

## 2013-06-24 MED ORDER — ONDANSETRON HCL 4 MG/2ML IJ SOLN
INTRAMUSCULAR | Status: AC
  Filled 2013-06-24: qty 2

## 2013-06-24 MED ORDER — GLYCOPYRROLATE 0.2 MG/ML IJ SOLN
INTRAMUSCULAR | Status: AC
  Filled 2013-06-24: qty 3

## 2013-06-24 MED ORDER — MENTHOL 3 MG MT LOZG: 1.0000 | LOZENGE | OROMUCOSAL | Status: DC | PRN

## 2013-06-24 MED ORDER — HEMOSTATIC AGENTS (NO CHARGE) OPTIME
TOPICAL | Status: DC | PRN
  Administered 2013-06-24: 1 via TOPICAL

## 2013-06-24 MED ORDER — SODIUM CHLORIDE 0.9 % IJ SOLN
3.0000 mL | Freq: Two times a day (BID) | INTRAMUSCULAR | Status: DC
  Administered 2013-06-26: 3 mL via INTRAVENOUS

## 2013-06-24 MED ORDER — OXYCODONE-ACETAMINOPHEN 10-325 MG PO TABS
1.0000 | ORAL_TABLET | ORAL | Status: DC | PRN
  Administered 2013-06-24: 1 via ORAL

## 2013-06-24 MED ORDER — HYDROMORPHONE HCL PF 1 MG/ML IJ SOLN: 0.2500 mg | INTRAMUSCULAR | Status: DC | PRN

## 2013-06-24 MED ORDER — DOCUSATE SODIUM 100 MG PO CAPS
100.0000 mg | ORAL_CAPSULE | Freq: Two times a day (BID) | ORAL | Status: DC
  Administered 2013-06-24 – 2013-06-26 (×4): 100 mg via ORAL
  Filled 2013-06-24 (×4): qty 1

## 2013-06-24 MED ORDER — GABAPENTIN 100 MG PO CAPS
100.0000 mg | ORAL_CAPSULE | Freq: Three times a day (TID) | ORAL | Status: DC
  Administered 2013-06-24 – 2013-06-26 (×6): 100 mg via ORAL
  Filled 2013-06-24 (×8): qty 1

## 2013-06-24 MED ORDER — OXYCODONE HCL 5 MG PO TABS
5.0000 mg | ORAL_TABLET | ORAL | Status: DC | PRN
  Administered 2013-06-25 – 2013-06-26 (×6): 5 mg via ORAL
  Filled 2013-06-24 (×6): qty 1

## 2013-06-24 MED ORDER — FENTANYL CITRATE 0.05 MG/ML IJ SOLN
INTRAMUSCULAR | Status: AC
  Filled 2013-06-24: qty 5

## 2013-06-24 MED ORDER — DEXAMETHASONE SODIUM PHOSPHATE 10 MG/ML IJ SOLN
INTRAMUSCULAR | Status: AC
  Filled 2013-06-24: qty 1

## 2013-06-24 MED ORDER — MIDAZOLAM HCL 5 MG/5ML IJ SOLN
INTRAMUSCULAR | Status: DC | PRN
  Administered 2013-06-24: 2 mg via INTRAVENOUS

## 2013-06-24 MED ORDER — SUCCINYLCHOLINE CHLORIDE 20 MG/ML IJ SOLN
INTRAMUSCULAR | Status: AC
  Filled 2013-06-24: qty 1

## 2013-06-24 MED ORDER — SUCCINYLCHOLINE CHLORIDE 20 MG/ML IJ SOLN
INTRAMUSCULAR | Status: DC | PRN
  Administered 2013-06-24: 120 mg via INTRAVENOUS

## 2013-06-24 MED ORDER — LACTATED RINGERS IV SOLN
INTRAVENOUS | Status: DC | PRN
  Administered 2013-06-24 (×2): via INTRAVENOUS

## 2013-06-24 MED ORDER — PROPOFOL 10 MG/ML IV BOLUS
INTRAVENOUS | Status: DC | PRN
  Administered 2013-06-24: 100 mg via INTRAVENOUS
  Administered 2013-06-24: 300 mg via INTRAVENOUS

## 2013-06-24 MED ORDER — HYDROMORPHONE HCL PF 1 MG/ML IJ SOLN
0.5000 mg | INTRAMUSCULAR | Status: DC | PRN
  Administered 2013-06-24 – 2013-06-25 (×7): 1 mg via INTRAVENOUS
  Filled 2013-06-24 (×7): qty 1

## 2013-06-24 MED ORDER — SODIUM CHLORIDE 0.9 % IJ SOLN: 3.0000 mL | INTRAMUSCULAR | Status: DC | PRN

## 2013-06-24 MED ORDER — ONDANSETRON HCL 4 MG/2ML IJ SOLN
INTRAMUSCULAR | Status: DC | PRN
  Administered 2013-06-24: 4 mg via INTRAVENOUS

## 2013-06-24 MED ORDER — ACETAMINOPHEN 650 MG RE SUPP: 650.0000 mg | RECTAL | Status: DC | PRN

## 2013-06-24 MED ORDER — NEOSTIGMINE METHYLSULFATE 10 MG/10ML IV SOLN
INTRAVENOUS | Status: AC
  Filled 2013-06-24: qty 1

## 2013-06-24 MED ORDER — VANCOMYCIN HCL 1000 MG IV SOLR
1000.0000 mg | INTRAVENOUS | Status: DC | PRN
  Administered 2013-06-24: 1500 mg via INTRAVENOUS

## 2013-06-24 MED ORDER — ONDANSETRON HCL 4 MG/2ML IJ SOLN: 4.0000 mg | Freq: Once | INTRAMUSCULAR | Status: DC | PRN

## 2013-06-24 MED ORDER — LIDOCAINE-EPINEPHRINE 1 %-1:100000 IJ SOLN
INTRAMUSCULAR | Status: DC | PRN
  Administered 2013-06-24: 10 mL

## 2013-06-24 MED ORDER — MIDAZOLAM HCL 2 MG/2ML IJ SOLN
INTRAMUSCULAR | Status: AC
  Filled 2013-06-24: qty 2

## 2013-06-24 MED ORDER — ALUM & MAG HYDROXIDE-SIMETH 200-200-20 MG/5ML PO SUSP: 30.0000 mL | Freq: Four times a day (QID) | ORAL | Status: DC | PRN

## 2013-06-24 MED ORDER — RISPERIDONE MICROSPHERES 25 MG IM SUSR: 25.0000 mg | INTRAMUSCULAR | Status: DC

## 2013-06-24 MED ORDER — CYCLOBENZAPRINE HCL 10 MG PO TABS
10.0000 mg | ORAL_TABLET | Freq: Three times a day (TID) | ORAL | Status: DC | PRN
  Administered 2013-06-24 – 2013-06-26 (×4): 10 mg via ORAL
  Filled 2013-06-24 (×4): qty 1

## 2013-06-24 MED ORDER — SODIUM CHLORIDE 0.9 % IV SOLN: 250.0000 mL | INTRAVENOUS | Status: DC

## 2013-06-24 MED ORDER — DEXAMETHASONE SODIUM PHOSPHATE 4 MG/ML IJ SOLN
4.0000 mg | Freq: Four times a day (QID) | INTRAMUSCULAR | Status: DC
  Filled 2013-06-24 (×8): qty 1

## 2013-06-24 MED ORDER — DEXAMETHASONE 4 MG PO TABS
4.0000 mg | ORAL_TABLET | Freq: Four times a day (QID) | ORAL | Status: DC
  Administered 2013-06-24 – 2013-06-26 (×7): 4 mg via ORAL
  Filled 2013-06-24 (×11): qty 1

## 2013-06-24 MED ORDER — OXYCODONE-ACETAMINOPHEN 5-325 MG PO TABS
1.0000 | ORAL_TABLET | ORAL | Status: DC | PRN
  Administered 2013-06-24 – 2013-06-26 (×7): 1 via ORAL
  Filled 2013-06-24 (×7): qty 1

## 2013-06-24 MED ORDER — BUPIVACAINE HCL (PF) 0.25 % IJ SOLN
INTRAMUSCULAR | Status: DC | PRN
  Administered 2013-06-24: 20 mL

## 2013-06-24 MED ORDER — VECURONIUM BROMIDE 10 MG IV SOLR
INTRAVENOUS | Status: DC | PRN
  Administered 2013-06-24 (×2): 2 mg via INTRAVENOUS
  Administered 2013-06-24: 3 mg via INTRAVENOUS

## 2013-06-24 MED ORDER — THROMBIN 5000 UNITS EX SOLR
CUTANEOUS | Status: DC | PRN
  Administered 2013-06-24 (×2): 5000 [IU] via TOPICAL

## 2013-06-24 SURGICAL SUPPLY — 65 items
ADH SKN CLS APL DERMABOND .7 (GAUZE/BANDAGES/DRESSINGS) ×1
APL SKNCLS STERI-STRIP NONHPOA (GAUZE/BANDAGES/DRESSINGS) ×1
BAG DECANTER FOR FLEXI CONT (MISCELLANEOUS) ×3 IMPLANT
BENZOIN TINCTURE PRP APPL 2/3 (GAUZE/BANDAGES/DRESSINGS) ×3 IMPLANT
BLADE 10 SAFETY STRL DISP (BLADE) ×3 IMPLANT
BLADE SURG ROTATE 9660 (MISCELLANEOUS) IMPLANT
BRUSH SCRUB EZ PLAIN DRY (MISCELLANEOUS) ×3 IMPLANT
BUR MATCHSTICK NEURO 3.0 LAGG (BURR) ×3 IMPLANT
BUR PRECISION FLUTE 6.0 (BURR) IMPLANT
CANISTER SUCT 3000ML (MISCELLANEOUS) IMPLANT
CAP ELLIPSE LOCKING (Cap) ×24 IMPLANT
CLOSURE WOUND 1/2 X4 (GAUZE/BANDAGES/DRESSINGS) ×2
CONT SPEC 4OZ CLIKSEAL STRL BL (MISCELLANEOUS) ×7 IMPLANT
DECANTER SPIKE VIAL GLASS SM (MISCELLANEOUS) ×3 IMPLANT
DERMABOND ADVANCED (GAUZE/BANDAGES/DRESSINGS) ×2
DERMABOND ADVANCED .7 DNX12 (GAUZE/BANDAGES/DRESSINGS) ×1 IMPLANT
DRAPE LAPAROTOMY 100X72X124 (DRAPES) ×3 IMPLANT
DRAPE POUCH INSTRU U-SHP 10X18 (DRAPES) ×3 IMPLANT
DRAPE PROXIMA HALF (DRAPES) IMPLANT
DRAPE SURG 17X23 STRL (DRAPES) ×3 IMPLANT
DRSG OPSITE 4X5.5 SM (GAUZE/BANDAGES/DRESSINGS) ×3 IMPLANT
DRSG OPSITE POSTOP 4X10 (GAUZE/BANDAGES/DRESSINGS) ×2 IMPLANT
ELECT BLADE 4.0 EZ CLEAN MEGAD (MISCELLANEOUS) ×3
ELECT REM PT RETURN 9FT ADLT (ELECTROSURGICAL) ×3
ELECTRODE BLDE 4.0 EZ CLN MEGD (MISCELLANEOUS) IMPLANT
ELECTRODE REM PT RTRN 9FT ADLT (ELECTROSURGICAL) ×1 IMPLANT
GAUZE SPONGE 4X4 16PLY XRAY LF (GAUZE/BANDAGES/DRESSINGS) IMPLANT
GLOVE BIO SURGEON STRL SZ7.5 (GLOVE) ×2 IMPLANT
GLOVE BIO SURGEON STRL SZ8 (GLOVE) ×3 IMPLANT
GLOVE ECLIPSE 6.5 STRL STRAW (GLOVE) ×2 IMPLANT
GLOVE EXAM NITRILE LRG STRL (GLOVE) IMPLANT
GLOVE EXAM NITRILE MD LF STRL (GLOVE) IMPLANT
GLOVE EXAM NITRILE XL STR (GLOVE) IMPLANT
GLOVE EXAM NITRILE XS STR PU (GLOVE) IMPLANT
GLOVE INDICATOR 8.5 STRL (GLOVE) ×3 IMPLANT
GLOVE SURG SS PI 7.0 STRL IVOR (GLOVE) ×2 IMPLANT
GOWN STRL REUS W/ TWL LRG LVL3 (GOWN DISPOSABLE) ×1 IMPLANT
GOWN STRL REUS W/ TWL XL LVL3 (GOWN DISPOSABLE) ×2 IMPLANT
GOWN STRL REUS W/TWL 2XL LVL3 (GOWN DISPOSABLE) IMPLANT
GOWN STRL REUS W/TWL LRG LVL3 (GOWN DISPOSABLE) ×6
GOWN STRL REUS W/TWL XL LVL3 (GOWN DISPOSABLE) ×6
KIT BASIN OR (CUSTOM PROCEDURE TRAY) ×3 IMPLANT
KIT INFUSE X SMALL 1.4CC (Orthopedic Implant) ×2 IMPLANT
KIT ROOM TURNOVER OR (KITS) ×3 IMPLANT
NDL SPNL 22GX3.5 QUINCKE BK (NEEDLE) ×1 IMPLANT
NEEDLE HYPO 22GX1.5 SAFETY (NEEDLE) ×3 IMPLANT
NEEDLE SPNL 22GX3.5 QUINCKE BK (NEEDLE) ×3 IMPLANT
NS IRRIG 1000ML POUR BTL (IV SOLUTION) ×3 IMPLANT
PACK LAMINECTOMY NEURO (CUSTOM PROCEDURE TRAY) ×3 IMPLANT
PUTTY BONE DBX 2.5 MIS (Bone Implant) ×2 IMPLANT
ROD ELLIPSE 120MM (Rod) ×2 IMPLANT
SCREW 3.5X16 (Screw) ×4 IMPLANT
SPONGE GAUZE 4X4 12PLY (GAUZE/BANDAGES/DRESSINGS) ×3 IMPLANT
SPONGE LAP 4X18 X RAY DECT (DISPOSABLE) ×2 IMPLANT
SPONGE SURGIFOAM ABS GEL SZ50 (HEMOSTASIS) ×3 IMPLANT
STRIP BIOACTIVE 5CC 25X50X4MM (Miscellaneous) ×2 IMPLANT
STRIP CLOSURE SKIN 1/2X4 (GAUZE/BANDAGES/DRESSINGS) ×3 IMPLANT
SUT VIC AB 0 CT1 18XCR BRD8 (SUTURE) ×1 IMPLANT
SUT VIC AB 0 CT1 8-18 (SUTURE) ×3
SUT VIC AB 2-0 CT1 18 (SUTURE) ×5 IMPLANT
SUT VICRYL 4-0 PS2 18IN ABS (SUTURE) ×3 IMPLANT
SYR 20ML ECCENTRIC (SYRINGE) ×3 IMPLANT
TOWEL OR 17X24 6PK STRL BLUE (TOWEL DISPOSABLE) ×3 IMPLANT
TOWEL OR 17X26 10 PK STRL BLUE (TOWEL DISPOSABLE) ×3 IMPLANT
WATER STERILE IRR 1000ML POUR (IV SOLUTION) ×3 IMPLANT

## 2013-06-24 NOTE — H&P (Signed)
Roger GuardianRonald W Woods is an 43 y.o. male.   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 However cervix piercing worsening lower cervical interscapular muscles thousand pain refractory to all forms of conservative treatment. Workup revealed loosening of the C7 screws and possible pseudoarthrosis at C6-7. Due to his failure conservative treatment failure of bone stimulation and with imaging findings, I recommended a reinsertion of his posterior cervical fusion redo fusion from C6 down to T1 with a removal of the C7 screws and replacement of either C7 or T1 pedicle screws. I have extensively reviewed the risks and benefits of this operation with the patient as well as perioperative course and expectations of outcome and alternatives of surgery and he understands and agrees to proceed forward.  Past Medical History  Diagnosis Date  . Schizophrenia   . Psychosis   . Insomnia due to medical condition   . Chronic pain   . Headache(784.0)   . Weakness     occasionally in both arms and hands;related to neck issues  . Hemorrhoids     Past Surgical History  Procedure Laterality Date  . Posterior cervical fusion/foraminotomy N/A 11/19/2012    Procedure: CERVICAL TWO TO CERVICAL SEVEN POSTERIOR CERVICAL FUSION/FORAMINOTOMY LEVEL 5;  Surgeon: Mariam DollarGary P Cram, MD;  Location: [facility];  Service: Neurosurgery;  Laterality: N/A;  C2-7 posteior cervical arthrodesis with instrumentation    Family History  Problem Relation Age of Onset  . High blood pressure Maternal Grandmother    Social History:  reports that he has been smoking Cigarettes.  He has a 7 pack-year smoking history. He has never used smokeless tobacco. He reports that he does not drink alcohol or use illicit drugs.  Allergies:  Allergies  Allergen Reactions  . Penicillins Other (See Comments)   unsure    Medications Prior to Admission  Medication Sig Dispense Refill  . gabapentin (NEURONTIN) 100 MG capsule Take 100 mg by mouth 3 (three) times daily.      Marland Kitchen. oxyCODONE-acetaminophen (PERCOCET) 10-325 MG per tablet Take 1 tablet by mouth every 4 (four) hours as needed for pain.      Marland Kitchen. risperiDONE microspheres (RISPERDAL CONSTA) 25 MG injection Inject 25 mg into the muscle every 14 (fourteen) days.        No results found for this or any previous visit (from the past 48 hour(s)). No results found.  Review of Systems  Constitutional: Negative.   Eyes: Negative.   Respiratory: Negative.   Cardiovascular: Negative.   Gastrointestinal: Negative.   Genitourinary: Negative.   Musculoskeletal: Positive for neck pain.  Skin: Negative.   Neurological: Positive for tingling, sensory change and headaches.  Psychiatric/Behavioral: Negative.     Blood pressure 153/87, pulse 74, temperature 99.1 F (37.3 C), temperature source Oral, resp. rate 16, SpO2 100.00%. Physical Exam  Constitutional: He is oriented to person, place, and time. He appears well-developed and well-nourished.  Eyes: Pupils are equal, round, and reactive to light.  Neck: Normal range of motion.  Respiratory: Effort normal.  GI: Soft.  Neurological: He is alert and oriented to person, place, and time. He has normal strength. GCS eye subscore is 4. GCS verbal subscore is 5. GCS motor subscore is 6.  Strength is 5 out of 5 in his deltoids, biceps, triceps, wrist flexion, wrist extension, and intrinsics. Extensive her spinal muscle spasm interscapular spasm an exam consistent  with myelopathy  Skin: Skin is warm and dry.     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  CRAM,GARY P 06/24/2013, 8:12 AM

## 2013-06-24 NOTE — Anesthesia Postprocedure Evaluation (Signed)
  Anesthesia Post-op Note  Patient: Roger Woods  Procedure(s) Performed: Procedure(s) with comments: EXPLORATION/HARDWARE REMOVAL CERVICAL FOUR-FIVE, CERVICAL FIVE-SIX, AND CERVICAL SIX-SEVEN. (N/A) - posterior   Patient Location: PACU  Anesthesia Type:General  Level of Consciousness: awake, alert , oriented and patient cooperative  Airway and Oxygen Therapy: Patient Spontanous Breathing  Post-op Pain: mild  Post-op Assessment: Post-op Vital signs reviewed, Patient's Cardiovascular Status Stable, Respiratory Function Stable, Patent Airway and No signs of Nausea or vomiting  Post-op Vital Signs: stable  Last Vitals:  Filed Vitals:   06/24/13 1240  BP: 117/72  Pulse: 65  Temp: 36.9 C  Resp: 18    Complications: No apparent anesthesia complications

## 2013-06-24 NOTE — Op Note (Signed)
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 lateral mass screw system with 16 mm pedicle screws at T1 progress surgeon: Jillyn HiddenGary cram  Assistant: Coletta MemosKyle Cabbell  Anesthesia: Gen.  EBL: Mental  History of present illness: Patient is a very pleasant 43 year old 7 who has a previous diagnosis of OPLL who underwent a CT C7 posterior cervical decompression and fusion several months ago initially did very well however start dressing worsening pain low part of his neck and interscapular. Workup revealed progressive loosening of the C7 lateral mass screws and due to patient's diagnosis pseudoarthrosis his failure to stimulation and progression of clinical syndrome I recommended re\re expiration of fusion and redo posterior lateral fusion from C6 down to T1 with placement of bilateral T1 pedicle screws. I extensively went over the risks and benefits of the operation the patient as well as perioperative course and expectations of outcome of her surgery and he understands and agrees to proceed forward.  Operative procedure: Patient brought into the or was induced under general anesthesia positioned prone in pins the back of his neck was prepped and draped in routine sterile fashion his old incision was opened up and extended slightly caudally the scar tissues dissected free and subperiosteal dissections care on the T1 and C7 spinous processes and then the hardware was identified and exposed up to C2. Laminotomy was begun bilaterally by under biting the T1 lamina and margins superiorly up into the inferior aspect of the  C7 hardware construct. The T1 nerve roots at L5 foramen I was in the T1 nerve root identify and better characterized the T1 pedicle then the fusion was inspected from C2-C6 and felt to be solid so the cross-link and nuts and rods were removed from C2-C7 then using AP fluoroscopy the eye confirmed the location of the lateral aspect of the pedicle at T1 and using a lateral to medial trajectory I drilled 216 mm holes bilaterally probed tapped probed again and 2-16 mm pedicle screws were inserted at T1 bilaterally. Then at this point I removed the C7 screws which were noted to be loose and there was noted be an inadequate an incomplete fusion mass at C6-7. Then the rods I removed Karena Addisonrene checked and 1 and was long enough to replace and I replaced the left rod with the pre-existing what I cut a new rod for the right side and bent him accordingly and top tightness tightened down everything was anchored in place then aggressive decortication was care lateral masses from C6 down to T1 combination of locally harvested autograft mixed with DBX , kinex, and BMP was intact posterior laterally from C6 down to T1. A drain was placed the foraminal reinspected and Gelfoam was overlaid top of the dura to confirm oh in no migration of graft material then the wounds closed in layers with after Vicryl the skin was closed running 4 subcuticular benzoin and Steri-Strips were applied Dermabond patient went to recovery room in stable condition at the end of case on it counts sponge counts were correct.

## 2013-06-24 NOTE — Transfer of Care (Signed)
Immediate Anesthesia Transfer of Care Note  Patient: Roger Woods  Procedure(s) Performed: Procedure(s) with comments: EXPLORATION/HARDWARE REMOVAL CERVICAL FOUR-FIVE, CERVICAL FIVE-SIX, AND CERVICAL SIX-SEVEN. (N/A) - posterior   Patient Location: PACU  Anesthesia Type:General  Level of Consciousness: awake, alert , oriented, patient cooperative and responds to stimulation  Airway & Oxygen Therapy: Patient Spontanous Breathing and Patient connected to nasal cannula oxygen  Post-op Assessment: Report given to PACU RN, Post -op Vital signs reviewed and stable and Patient moving all extremities X 4  Post vital signs: Reviewed and stable  Complications: No apparent anesthesia complications

## 2013-06-24 NOTE — Progress Notes (Signed)
Breathing above monitor rate/ leads repositioned

## 2013-06-24 NOTE — Progress Notes (Signed)
UR complete.  Courtney Robarge RN, MSN 

## 2013-06-24 NOTE — Anesthesia Preprocedure Evaluation (Signed)
Anesthesia Evaluation  Patient identified by MRN, date of birth, ID band Patient awake    Reviewed: Allergy & Precautions, H&P , NPO status , Patient's Chart, lab work & pertinent test results  Airway       Dental   Pulmonary Current Smoker,          Cardiovascular     Neuro/Psych PSYCHIATRIC DISORDERS Schizophrenia    GI/Hepatic   Endo/Other  Morbid obesity  Renal/GU      Musculoskeletal   Abdominal   Peds  Hematology   Anesthesia Other Findings   Reproductive/Obstetrics                           Anesthesia Physical Anesthesia Plan  ASA: II  Anesthesia Plan: General   Post-op Pain Management:    Induction: Intravenous  Airway Management Planned: Oral ETT  Additional Equipment:   Intra-op Plan:   Post-operative Plan: Extubation in OR  Informed Consent: I have reviewed the patients History and Physical, chart, labs and discussed the procedure including the risks, benefits and alternatives for the proposed anesthesia with the patient or authorized representative who has indicated his/her understanding and acceptance.     Plan Discussed with:   Anesthesia Plan Comments:         Anesthesia Quick Evaluation

## 2013-06-24 NOTE — Progress Notes (Signed)
ANTIBIOTIC CONSULT NOTE - INITIAL  Pharmacy Consult for vancomycin Indication: post-op prophylaxis with drain  Allergies  Allergen Reactions  . Penicillins Other (See Comments)    unsure    Patient Measurements: Height: 5\' 10"  (177.8 cm) Weight: 238 lb 1.6 oz (108 kg) IBW/kg (Calculated) : 73   Vital Signs: Temp: 98.4 F (36.9 C) (06/17 1240) Temp src: Oral (06/17 1240) BP: 117/72 mmHg (06/17 1240) Pulse Rate: 65 (06/17 1240) Intake/Output from previous day:   Intake/Output from this shift: Total I/O In: 2300 [I.V.:2300] Out: 375 [Urine:175; Blood:200]  Labs: No results found for this basename: WBC, HGB, PLT, LABCREA, CREATININE,  in the last 72 hours Estimated Creatinine Clearance: 123.4 ml/min (by C-G formula based on Cr of 0.95). No results found for this basename: VANCOTROUGH, Leodis BinetVANCOPEAK, VANCORANDOM, GENTTROUGH, GENTPEAK, GENTRANDOM, TOBRATROUGH, TOBRAPEAK, TOBRARND, AMIKACINPEAK, AMIKACINTROU, AMIKACIN,  in the last 72 hours   Microbiology: Recent Results (from the past 720 hour(s))  SURGICAL PCR SCREEN     Status: None   Collection Time    06/17/13  2:28 PM      Result Value Ref Range Status   MRSA, PCR NEGATIVE  NEGATIVE Final   Staphylococcus aureus NEGATIVE  NEGATIVE Final   Comment:            The Xpert SA Assay (FDA     approved for NASAL specimens     in patients over 43 years of age),     is one component of     a comprehensive surveillance     program.  Test performance has     been validated by [facility] for patients greater     than or equal to 43 year old.     It is not intended     to diagnose infection nor to     guide or monitor treatment.    Medical History: Past Medical History  Diagnosis Date  . Schizophrenia   . Psychosis   . Insomnia due to medical condition   . Chronic pain   . Headache(784.0)   . Weakness     occasionally in both arms and hands;related to neck issues  . Hemorrhoids     Assessment: [age] YOM s/p  exploration/hardware removal cervical 4-5, 5-6 and 6-7 to start vancomycin for surgical prophylaxis- with drain in place. Received pre-op dose at 0855 this morning. SCr 0.95 with est CrCl >12500mL/min.  Goal of Therapy:  Vancomycin trough level 15-20 mcg/ml  Plan:  1. Vancomycin 1000mg  IV q8h starting at 2000 tonight 2. Will follow any c/s, renal function, need for trough 3. Follow LOT as determined by MD with drain in place  Lauren D. Bajbus, PharmD, BCPS Clinical Pharmacist Pager: [phone] 06/24/2013 5:10 PM

## 2013-06-25 MED FILL — Oxycodone w/ Acetaminophen Tab 5-325 MG: ORAL | Qty: 1 | Status: AC

## 2013-06-25 MED FILL — Oxycodone HCl Tab 5 MG: ORAL | Qty: 1 | Status: AC

## 2013-06-25 NOTE — Evaluation (Signed)
Physical Therapy Evaluation Patient Details Name: Roger Woods MRN: [id number] DOB: 10/19/1970 Today's Date: 06/25/2013   History of Present Illness  43 y.o. male [facility] for elective Reexploration of posterior cervical fusion with removal of hardware from C2-C7 with removal of bilateral C7 lateral mass screws and placement of bilateral T1 pedicle screws with laminotomies and foraminotomies of the T1 nerve roots bilaterally.  Pt with significant PMhx of schizophrenia and c-spine surgery 11/2012.   Clinical Impression  Pt is POD #1 s/p c-spine revision surgery.  He is very lethargic, keeping his eyes closed much of our session.  He is holding IV pole for balance and support during gait.  I would like to see him again tomorrow to reinforce c-spine rules and practice stairs to enter his home.  He will likely progress well and not need any f/u PT or equipment.  PT to follow acutely for deficits listed below.       Follow Up Recommendations No PT follow up;Supervision for mobility/OOB    Equipment Recommendations  None recommended by PT    Recommendations for Other Services   NA    Precautions / Restrictions Precautions Precautions: Cervical Precaution Comments: Handout given, but not reviewed due to decreased attention likely due to pain meds      Mobility  Bed Mobility Overal bed mobility: Needs Assistance Bed Mobility: Rolling;Sidelying to Sit Rolling: Supervision Sidelying to sit: Supervision       General bed mobility comments: supervision for safety as pt has his eyes closed.  Verbal cues for log roll, assist to find the railing for leverage  Transfers Overall transfer level: Needs assistance Equipment used:  (IV pole per pt request) Transfers: Sit to/from Stand Sit to Stand: Min guard         General transfer comment: Min guard assist for safety due to eyes closed and slight tendancy towards posterior LOB.   Ambulation/Gait Ambulation/Gait  assistance: Min guard Ambulation Distance (Feet): 20 Feet Assistive device:  (IV pole) Gait Pattern/deviations: Step-through pattern;Staggering left;Staggering right     General Gait Details: Mildly staggering gait pattern      Balance Overall balance assessment: Needs assistance Sitting-balance support: Feet supported;No upper extremity supported Sitting balance-Leahy Scale: Good   Postural control: Posterior lean Standing balance support: No upper extremity supported Standing balance-Leahy Scale: Poor Standing balance comment: some mild LOB (again, likely due to eye closed much of session) posteriorly while washing hands at sink.  Min assist to prevent LOB posteriorly                             Pertinent Vitals/Pain See vitals flow sheet.     Home Living Family/patient expects to be discharged to:: Private residence Living Arrangements: Other relatives (grandmother) Available Help at Discharge: Family;Available 24 hours/day (grandmother 596 y.o. girlfriend coming over to help) Type of Home: House Home Access: Stairs to enter Entrance Stairs-Rails: Left Entrance Stairs-Number of Steps: 2-3 Home Layout: Two level;Full bath on main level;Able to live on main level with bedroom/bathroom Home Equipment: None      Prior Function Level of Independence: Independent         Comments: reports not driving.       Hand Dominance   Dominant Hand: Right    Extremity/Trunk Assessment   Upper Extremity Assessment: Defer to OT evaluation           Lower Extremity Assessment: Overall WFL for tasks  assessed      Cervical / Trunk Assessment: Other exceptions  Communication   Communication: No difficulties  Cognition Arousal/Alertness: Lethargic;Suspect due to medications Behavior During Therapy: Agitated Overall Cognitive Status: Difficult to assess                               Assessment/Plan    PT Assessment Patient needs continued PT  services  PT Diagnosis Difficulty walking;Abnormality of gait;Generalized weakness;Acute pain   PT Problem List Decreased strength;Decreased activity tolerance;Decreased balance;Decreased mobility;Decreased knowledge of precautions;Pain  PT Treatment Interventions DME instruction;Gait training;Stair training;Therapeutic activities;Functional mobility training;Therapeutic exercise;Balance training;Neuromuscular re-education;Patient/family education;Modalities   PT Goals (Current goals can be found in the Care Plan section) Acute Rehab PT Goals Patient Stated Goal: to go home ASAP PT Goal Formulation: With patient Time For Goal Achievement: 07/02/13 Potential to Achieve Goals: Good    Frequency Min 5X/week    End of Session   Activity Tolerance: Patient limited by fatigue;Patient limited by pain Patient left: in chair;with call bell/phone within reach;with chair alarm set           Time: [id number] PT Time Calculation (min): 17 min   Charges:   PT Evaluation $Initial PT Evaluation Tier I: 1 Procedure PT Treatments $Therapeutic Activity: 8-22 mins        Rebecca B. Medendorp, PT, DPT [phone]   06/25/2013, 11:40 AM

## 2013-06-25 NOTE — Progress Notes (Signed)
Occupational Therapy Evaluation Patient Details Name: Roger Woods GuardianRonald W Henk MRN: [id number] DOB: 08/30/1970 Today's Date: 06/25/2013    History of Present Illness 43 y.o. male [facility] for elective Reexploration of posterior cervical fusion with removal of hardware from C2-C7 with removal of bilateral C7 lateral mass screws and placement of bilateral T1 pedicle screws with laminotomies and foraminotomies of the T1 nerve roots bilaterally.  Pt with significant PMhx of schizophrenia and c-spine surgery 11/2012.    Clinical Impression   PTA pt lived at home with his grandmother and was independent with ADLs and functional mobility, and pt reports he was not driving. Pt was distracted during OT session and unsure of pt's current level of safety awareness as he is not attending to education on cervical precautions. Pt overall min guard for functional mobility and requires assist for LB ADLs, however feel that pt will be able to utilize figure four to reach feet when pain is controlled. Pt would benefit from skilled OT to increase pt to mod independent level with ADLs.     Follow Up Recommendations  No OT follow up;Supervision/Assistance - 24 hour    Equipment Recommendations  None recommended by OT       Precautions / Restrictions Precautions Precautions: Cervical Precaution Comments: Reviewed cervical handout with pt, however pt distracted due to reported conflict with girlfriend. Minimal attention, however encouraged pt to read over handout on his own.  Restrictions Weight Bearing Restrictions: No      Mobility Bed Mobility Overal bed mobility: Needs Assistance Bed Mobility: Rolling;Sidelying to Sit;Sit to Sidelying Rolling: Supervision Sidelying to sit: Supervision     Sit to sidelying: Supervision General bed mobility comments: Supervision for safety and sequencing.   Transfers Overall transfer level: Needs assistance Equipment used: None Transfers: Sit to/from  Stand Sit to Stand: Min guard         General transfer comment: Min guard for safety due to slight sway on feet.     Balance Overall balance assessment: Needs assistance Sitting-balance support: Feet supported;No upper extremity supported Sitting balance-Leahy Scale: Good   Postural control: Posterior lean Standing balance support: No upper extremity supported Standing balance-Leahy Scale: Poor Standing balance comment: some mild LOB (again, likely due to eye closed much of session) posteriorly while washing hands at sink.  Min assist to prevent LOB posteriorly                            ADL Overall ADL's : Needs assistance/impaired Eating/Feeding: Independent;Sitting   Grooming: Wash/dry hands;Standing;Supervision/safety   Upper Body Bathing: Sitting;Supervision/ safety;Set up   Lower Body Bathing: Minimal assistance;Sit to/from stand   Upper Body Dressing : Supervision/safety;Set up;Sitting   Lower Body Dressing: Minimal assistance;Sit to/from stand   Toilet Transfer: Min guard;Ambulation   Toileting- Clothing Manipulation and Hygiene: Min guard;Sit to/from stand   Tub/ Shower Transfer: Tub transfer;Min guard   Functional mobility during ADLs: Min guard General ADL Comments: Pt with decreased attention this date due to reported conflict with girlfriend. Verbal cues for pt to attend to session and cervical precautions, however pt asked questions including "will I be able to bend my neck?" and "why does my neck feel stiff?" However pt was able to recall levels of surgery. Pt ambulated to bathroom with Min guard assist for toileting.      Vision  Pt reports no change from baseline.  Perception Perception Perception Tested?: No   Praxis Praxis Praxis tested?: Within functional limits    Pertinent Vitals/Pain Pt did not respond to questions about pain, was distracted by phone despite multiple attempts to ascertain.     Hand  Dominance Right   Extremity/Trunk Assessment Upper Extremity Assessment Upper Extremity Assessment: Overall WFL for tasks assessed   Lower Extremity Assessment Lower Extremity Assessment: Overall WFL for tasks assessed   Cervical / Trunk Assessment Cervical / Trunk Assessment: Other exceptions Cervical / Trunk Exceptions: h/o c- spine surgery in Nov 2014   Communication Communication Communication: No difficulties   Cognition Arousal/Alertness: Awake/alert Behavior During Therapy: Flat affect Overall Cognitive Status: Within Functional Limits for tasks assessed                                Home Living Family/patient expects to be discharged to:: Private residence Living Arrangements: Other relatives (grandmother) Available Help at Discharge: Family;Available 24 hours/day Type of Home: House Home Access: Stairs to enter Enter[employer] CorporationEntrance Stairs-Number of Steps: 2-3 Entrance Stairs-Rails: Left Home Layout: Two level;Full bath on main level;Able to live on main level with bedroom/bathroom Alternate Level Stairs-Number of Steps: flight   Bathroom Shower/Tub: Chief Strategy OfficerTub/shower unit   Bathroom Toilet: Standard     Home Equipment: None          Prior Functioning/Environment Level of Independence: Independent        Comments: reports not driving.      OT Diagnosis: Generalized weakness;Acute pain   OT Problem List: Decreased strength;Decreased range of motion;Decreased activity tolerance;Impaired balance (sitting and/or standing);Decreased knowledge of precautions;Decreased safety awareness;Pain   OT Treatment/Interventions: Self-care/ADL training;Energy conservation;Therapeutic activities;Patient/family education;Balance training    OT Goals(Current goals can be found in the care plan section) Acute Rehab OT Goals Patient Stated Goal: to go home ASAP OT Goal Formulation: With patient Time For Goal Achievement: 07/02/13 Potential to Achieve Goals: Good ADL Goals Pt  Will Perform Grooming: with modified independence;standing Pt Will Perform Lower Body Bathing: with modified independence;sit to/from stand Pt Will Perform Lower Body Dressing: with modified independence;sit to/from stand Pt Will Transfer to Toilet: with modified independence;ambulating Pt Will Perform Toileting - Clothing Manipulation and hygiene: with modified independence;sit to/from stand Pt Will Perform Tub/Shower Transfer: Tub transfer;with modified independence;ambulating  OT Frequency: Min 2X/week   Barriers to D/C: Decreased caregiver support (unclear if girlfriend will be assisting)             End of Session  Activity Tolerance: Patient tolerated treatment well Patient left: in bed;with bed alarm set;with call bell/phone within reach   Time: 1350-1415 OT Time Calculation (min): 25 min Charges:  OT General Charges $OT Visit: 1 Procedure OT Evaluation $Initial OT Evaluation Tier I: 1 Procedure OT Treatments $Self Care/Home Management : 8-22 mins  Rae LipsMiller, LeeAnn M  [phone]  06/25/2013, 2:29 PM

## 2013-06-25 NOTE — Progress Notes (Signed)
Subjective: Patient reports Doing well neck pain with a feeling of increased swelling back there but no numbness and tingling his arms or his  Objective: Vital signs in last 24 hours: Temp:  [97.3 F (36.3 C)-98.4 F (36.9 C)] 97.6 F (36.4 C) (06/18 0500) Pulse Rate:  [55-73] 58 (06/18 0500) Resp:  [12-18] 18 (06/18 0500) BP: (117-149)/(50-84) 122/50 mmHg (06/18 0500) SpO2:  [96 %-100 %] 98 % (06/18 0500) Weight:  [108 kg (238 lb 1.6 oz)] 108 kg (238 lb 1.6 oz) (06/17 1700)  Intake/Output from previous day: 06/17 0701 - 06/18 0700 In: 2300 [I.V.:2300] Out: 2700 [Urine:2325; Drains:175; Blood:200] Intake/Output this shift:    Awake alert strength out of 5 wound clean and dry  Lab Results: No results found for this basename: WBC, HGB, HCT, PLT,  in the last 72 hours BMET No results found for this basename: NA, K, CL, CO2, GLUCOSE, BUN, CREATININE, CALCIUM,  in the last 72 hours  Studies/Results: Dg Cervical Spine 1 View  06/24/2013   CLINICAL DATA:  Posterior cervical fusion.  ACDF hardware removal.  EXAM: DG CERVICAL SPINE - 1 VIEW; DG C-ARM 61-120 MIN  COMPARISON:  11/19/2012  FINDINGS: By history the patient has posterior fusion of C2 through C7. Single frontal image is performed, showing interval removal of the next to lower-most left pedicle screw.  IMPRESSION: Intraoperative images.   Electronically Signed   By: Rosalie GumsBeth  Brown M.D.   On: 06/24/2013 13:07   Dg C-arm 1-60 Min  06/24/2013   CLINICAL DATA:  Posterior cervical fusion.  ACDF hardware removal.  EXAM: DG CERVICAL SPINE - 1 VIEW; DG C-ARM 61-120 MIN  COMPARISON:  11/19/2012  FINDINGS: By history the patient has posterior fusion of C2 through C7. Single frontal image is performed, showing interval removal of the next to lower-most left pedicle screw.  IMPRESSION: Intraoperative images.   Electronically Signed   By: Rosalie GumsBeth  Brown M.D.   On: 06/24/2013 13:07    Assessment/Plan: Progress mobilization with physical and  occupational therapy  LOS: 1 day     CRAM,GARY P 06/25/2013, 7:28 AM

## 2013-06-26 LAB — BASIC METABOLIC PANEL
BUN: 9 mg/dL (ref 6–23)
CALCIUM: 9.1 mg/dL (ref 8.4–10.5)
CO2: 25 meq/L (ref 19–32)
Chloride: 103 mEq/L (ref 96–112)
Creatinine, Ser: 0.84 mg/dL (ref 0.50–1.35)
GFR calc Af Amer: >90 mL/min (ref >90)
GFR calc non Af Amer: >90 mL/min (ref >90)
GLUCOSE: 236 mg/dL — AB (ref 70–99)
Potassium: 4.8 mEq/L (ref 3.7–5.3)
Sodium: 138 mEq/L (ref 137–147)

## 2013-06-26 MED ORDER — OXYCODONE HCL 15 MG PO TABS: 5.0000 mg | ORAL_TABLET | ORAL | Status: DC | PRN

## 2013-06-26 MED ORDER — CYCLOBENZAPRINE HCL 10 MG PO TABS: 10.0000 mg | ORAL_TABLET | Freq: Three times a day (TID) | ORAL | Status: DC | PRN

## 2013-06-26 MED ORDER — METHYLPREDNISOLONE (PAK) 4 MG PO TABS: ORAL_TABLET | ORAL | Status: DC

## 2013-06-26 NOTE — Discharge Summary (Signed)
  Physician Discharge Summary  [name] MRN: [id number] DOB/AGE: 43/07/1970 43 y.o.  Admit date: 06/24/2013 Discharge date: 06/26/2013  Admission Diagnoses:pseudoarthrosis C6-7  Discharge Diagnoses: same Active Problems:   Pseudoarthrosis of cervical spine   Discharged Condition: good  Hospital Course: patient is in the hospital underwent a exploration of fusion removal of hardware C2-C7 with replacement and removal of C7 screws with placement of this T1 pedicle screws. Postoperative patient did very well recovered in the floor on the floor he was ambulating and voiding spontaneously tolerating regular diet was stable for discharge  Consults: Significant Diagnostic Studies: Treatments:posterior cervical fusion C2 this T1 Discharge Exam: Blood pressure 131/70, pulse 52, temperature 97.9 F (36.6 C), temperature source Oral, resp. rate 16, height 5\' 10"  (1.778 m), weight 108 kg (238 lb 1.6 oz), SpO2 96.00%. Strength out of 5 wound clean and dry  Disposition: home     Medication List         cyclobenzaprine 10 MG tablet  Commonly known as:  FLEXERIL  Take 1 tablet (10 mg total) by mouth 3 (three) times daily as needed for muscle spasms.     gabapentin 100 MG capsule  Commonly known as:  NEURONTIN  Take 100 mg by mouth 3 (three) times daily.     methylPREDNIsolone 4 MG tablet  Commonly known as:  MEDROL DOSPACK  follow package directions     oxyCODONE 15 MG immediate release tablet  Commonly known as:  ROXICODONE  Take 0.5 tablets (7.5 mg total) by mouth every 4 (four) hours as needed for moderate pain.     oxyCODONE-acetaminophen 10-325 MG per tablet  Commonly known as:  PERCOCET  Take 1 tablet by mouth every 4 (four) hours as needed for pain.     risperiDONE microspheres 25 MG injection  Commonly known as:  RISPERDAL CONSTA  Inject 25 mg into the muscle every 14 (fourteen) days.           Follow-up Information   [facility]Terrika Zuver P,  MD.   Specialty:  Neurosurgery   Contact information:   [address] [place] [address] [phone]       Signed: Correy Weidner P 06/26/2013, 8:52 AM

## 2013-06-26 NOTE — Progress Notes (Signed)
Pt refused bed alarm this evening. States he can walk fine on his own. Education provided on the risks of not having a staff member present in ambulation. Will continue to monitor.

## 2013-06-26 NOTE — Discharge Instructions (Signed)
No lifting no bending no twisting no driving a riding a car less discomfort and forth to see me. Keep incision clean and dry.

## 2013-06-26 NOTE — Progress Notes (Signed)
Pt d/c home . Discharge, and follow up instruction given to pt with prescription pain relief rated 4/10 at present. Condition at discharge is stable . Dressing dry clean and intact and incision care also given and pt verbalized good understanding .

## 2013-06-26 NOTE — Progress Notes (Signed)
[name], male   DOB: 01/29/1970, 43 y.o.   MRN: [id number] Patient doing well positioned neck pain arms and hands feel good his voiding and ambulating plan discharge home

## 2013-06-28 ENCOUNTER — Encounter (HOSPITAL_COMMUNITY): Payer: Self-pay | Admitting: Neurosurgery

## 2013-07-29 NOTE — OR Nursing (Signed)
Addendum created on procedure reporting. 

## 2013-09-01 ENCOUNTER — Other Ambulatory Visit: Payer: Self-pay | Admitting: Neurosurgery

## 2013-09-01 DIAGNOSIS — M4712 Other spondylosis with myelopathy, cervical region: Secondary | ICD-10-CM

## 2013-09-04 ENCOUNTER — Ambulatory Visit
Admission: RE | Admit: 2013-09-04 | Discharge: 2013-09-04 | Disposition: A | Discharge status: Home / Self Care | Payer: Medicaid Other | Source: Ambulatory Visit | Attending: Neurosurgery | Admitting: Neurosurgery

## 2013-09-04 DIAGNOSIS — M4712 Other spondylosis with myelopathy, cervical region: Secondary | ICD-10-CM

## 2013-11-09 ENCOUNTER — Encounter (HOSPITAL_COMMUNITY): Payer: Self-pay

## 2013-11-09 ENCOUNTER — Emergency Department (HOSPITAL_COMMUNITY)
Admission: EM | Admit: 2013-11-09 | Discharge: 2013-11-09 | Disposition: A | Discharge status: Home / Self Care | Payer: Medicaid Other | Attending: Emergency Medicine | Admitting: Emergency Medicine

## 2013-11-09 DIAGNOSIS — R300 Dysuria: Secondary | ICD-10-CM | POA: Insufficient documentation

## 2013-11-09 DIAGNOSIS — Z79899 Other long term (current) drug therapy: Secondary | ICD-10-CM | POA: Diagnosis not present

## 2013-11-09 DIAGNOSIS — Z720 Tobacco use: Secondary | ICD-10-CM | POA: Diagnosis not present

## 2013-11-09 DIAGNOSIS — G4701 Insomnia due to medical condition: Secondary | ICD-10-CM | POA: Diagnosis not present

## 2013-11-09 DIAGNOSIS — F209 Schizophrenia, unspecified: Secondary | ICD-10-CM | POA: Insufficient documentation

## 2013-11-09 DIAGNOSIS — Z202 Contact with and (suspected) exposure to infections with a predominantly sexual mode of transmission: Secondary | ICD-10-CM | POA: Diagnosis present

## 2013-11-09 DIAGNOSIS — Z880 Allergy status to penicillin: Secondary | ICD-10-CM | POA: Insufficient documentation

## 2013-11-09 DIAGNOSIS — N522 Drug-induced erectile dysfunction: Secondary | ICD-10-CM | POA: Insufficient documentation

## 2013-11-09 DIAGNOSIS — Z8719 Personal history of other diseases of the digestive system: Secondary | ICD-10-CM | POA: Diagnosis not present

## 2013-11-09 DIAGNOSIS — G8929 Other chronic pain: Secondary | ICD-10-CM | POA: Diagnosis not present

## 2013-11-09 LAB — URINALYSIS, ROUTINE W REFLEX MICROSCOPIC
Bilirubin Urine: NEGATIVE
GLUCOSE, UA: NEGATIVE mg/dL
HGB URINE DIPSTICK: NEGATIVE
Ketones, ur: NEGATIVE mg/dL
Leukocytes, UA: NEGATIVE
Nitrite: NEGATIVE
PROTEIN: NEGATIVE mg/dL
SPECIFIC GRAVITY, URINE: 1.02 (ref 1.005–1.030)
Urobilinogen, UA: 1 mg/dL (ref 0.0–1.0)
pH: 6.5 (ref 5.0–8.0)

## 2013-11-09 MED ORDER — AZITHROMYCIN 250 MG PO TABS
1000.0000 mg | ORAL_TABLET | Freq: Once | ORAL | Status: AC
  Administered 2013-11-09: 1000 mg via ORAL
  Filled 2013-11-09: qty 4

## 2013-11-09 MED ORDER — CEFTRIAXONE SODIUM 250 MG IJ SOLR
250.0000 mg | Freq: Once | INTRAMUSCULAR | Status: AC
  Administered 2013-11-09: 250 mg via INTRAMUSCULAR
  Filled 2013-11-09: qty 250

## 2013-11-09 MED ORDER — LIDOCAINE HCL 1 % IJ SOLN
INTRAMUSCULAR | Status: AC
  Administered 2013-11-09: 1 mL
  Filled 2013-11-09: qty 20

## 2013-11-09 NOTE — ED Notes (Addendum)
Pt presents wanting to be checked for a STD and for erectile dysfunction.  Sts "there has been a little tingle for a month or two.  I can be going for an hour or hour and a half and not have an erection."  Denies pain and discharge.  Denies dysuria.

## 2013-11-09 NOTE — ED Notes (Signed)
MD at bedside. 

## 2013-11-09 NOTE — Discharge Instructions (Signed)
Erectile Dysfunction Erectile dysfunction is the inability to get or sustain a good enough erection to have sexual intercourse. Erectile dysfunction may involve:  Inability to get an erection.  Lack of enough hardness to allow penetration.  Loss of the erection before sex is finished.  Premature ejaculation. CAUSES  Certain drugs, such as:  Pain relievers.  Antihistamines.  Antidepressants.  Blood pressure medicines.  Water pills (diuretics).  Ulcer medicines.  Muscle relaxants.  Illegal drugs.  Excessive drinking.  Psychological causes, such as:  Anxiety.  Depression.  Sadness.  Exhaustion.  Performance fear.  Stress.  Physical causes, such as:  Artery problems. This may include diabetes, smoking, liver disease, or atherosclerosis.  High blood pressure.  Hormonal problems, such as low testosterone.  Obesity.  Nerve problems. This may include back or pelvic injuries, diabetes mellitus, multiple sclerosis, or Parkinson disease. SYMPTOMS  Inability to get an erection.  Lack of enough hardness to allow penetration.  Loss of the erection before sex is finished.  Premature ejaculation.  Normal erections at some times, but with frequent unsatisfactory episodes.  Orgasms that are not satisfactory in sensation or frequency.  Low sexual satisfaction in either partner because of erection problems.  A curved penis occurring with erection. The curve may cause pain or may be too curved to allow for intercourse.  Never having nighttime erections. DIAGNOSIS Your caregiver can often diagnose this condition by:  Performing a physical exam to find other diseases or specific problems with the penis.  Asking you detailed questions about the problem.  Performing blood tests to check for diabetes mellitus or to measure hormone levels.  Performing urine tests to find other underlying health conditions.  Performing an ultrasound exam to check for  scarring.  Performing a test to check blood flow to the penis.  Doing a sleep study at home to measure nighttime erections. TREATMENT   You may be prescribed medicines by mouth.  You may be given medicine injections into the penis.  You may be prescribed a vacuum pump with a ring.  Penile implant surgery may be performed. You may receive:  An inflatable implant.  A semirigid implant.  Blood vessel surgery may be performed. HOME CARE INSTRUCTIONS  If you are prescribed oral medicine, you should take the medicine as prescribed. Do not increase the dosage without first discussing it with your physician.  If you are using self-injections, be careful to avoid any veins that are on the surface of the penis. Apply pressure to the injection site for 5 minutes.  If you are using a vacuum pump, make sure you have read the instructions before using it. Discuss any questions with your physician before taking the pump home. SEEK MEDICAL CARE IF:  You experience pain that is not responsive to the pain medicine you have been prescribed.  You experience nausea or vomiting. SEEK IMMEDIATE MEDICAL CARE IF:   When taking oral or injectable medications, you experience an erection that lasts longer than 4 hours. If your physician is unavailable, go to the nearest emergency room for evaluation. An erection that lasts much longer than 4 hours can result in permanent damage to your penis.  You have pain that is severe.  You develop redness, severe pain, or severe swelling of your penis.  You have redness spreading up into your groin or lower abdomen.  You are unable to pass your urine. Document Released: 12/23/1999 Document Revised: 08/27/2012 Document Reviewed: 05/29/2012 [facility] Patient Information 2015 [place], [place]. This information is not  intended to replace advice given to you by your health care provider. Make sure you discuss any questions you have with your health care  provider. Sexually Transmitted Disease A sexually transmitted disease (STD) is a disease or infection that may be passed (transmitted) from person to person, usually during sexual activity. This may happen by way of saliva, semen, blood, vaginal mucus, or urine. Common STDs include:   Gonorrhea.   Chlamydia.   Syphilis.   HIV and AIDS.   Genital herpes.   Hepatitis B and C.   Trichomonas.   Human papillomavirus (HPV).   Pubic lice.   Scabies.  Mites.  Bacterial vaginosis. WHAT ARE CAUSES OF STDs? An STD may be caused by bacteria, a virus, or parasites. STDs are often transmitted during sexual activity if one person is infected. However, they may also be transmitted through nonsexual means. STDs may be transmitted after:   Sexual intercourse with an infected person.   Sharing sex toys with an infected person.   Sharing needles with an infected person or using unclean piercing or tattoo needles.  Having intimate contact with the genitals, mouth, or rectal areas of an infected person.   Exposure to infected fluids during birth. WHAT ARE THE SIGNS AND SYMPTOMS OF STDs? Different STDs have different symptoms. Some people may not have any symptoms. If symptoms are present, they may include:   Painful or bloody urination.   Pain in the pelvis, abdomen, vagina, anus, throat, or eyes.   A skin rash, itching, or irritation.  Growths, ulcerations, blisters, or sores in the genital and anal areas.  Abnormal vaginal discharge with or without bad odor.   Penile discharge in men.   Fever.   Pain or bleeding during sexual intercourse.   Swollen glands in the groin area.   Yellow skin and eyes (jaundice). This is seen with hepatitis.   Swollen testicles.  Infertility.  Sores and blisters in the mouth. HOW ARE STDs DIAGNOSED? To make a diagnosis, your health care provider may:   Take a medical history.   Perform a physical exam.   Take a  sample of any discharge to examine.  Swab the throat, cervix, opening to the penis, rectum, or vagina for testing.  Test a sample of your first morning urine.   Perform blood tests.   Perform a Pap test, if this applies.   Perform a colposcopy.   Perform a laparoscopy.  HOW ARE STDs TREATED? Treatment depends on the STD. Some STDs may be treated but not cured.   Chlamydia, gonorrhea, trichomonas, and syphilis can be cured with antibiotic medicine.   Genital herpes, hepatitis, and HIV can be treated, but not cured, with prescribed medicines. The medicines lessen symptoms.   Genital warts from HPV can be treated with medicine or by freezing, burning (electrocautery), or surgery. Warts may come back.   HPV cannot be cured with medicine or surgery. However, abnormal areas may be removed from the cervix, vagina, or vulva.   If your diagnosis is confirmed, your recent sexual partners need treatment. This is true even if they are symptom-free or have a negative culture or evaluation. They should not have sex until their health care providers say it is okay. HOW CAN I REDUCE MY RISK OF GETTING AN STD? Take these steps to reduce your risk of getting an STD:  Use latex condoms, dental dams, and water-soluble lubricants during sexual activity. Do not use petroleum jelly or oils.  Avoid having multiple sex partners.  Do not have sex with someone who has other sex partners.  Do not have sex with anyone you do not know or who is at high risk for an STD.  Avoid risky sex practices that can break your skin.  Do not have sex if you have open sores on your mouth or skin.  Avoid drinking too much alcohol or taking illegal drugs. Alcohol and drugs can affect your judgment and put you in a vulnerable position.  Avoid engaging in oral and anal sex acts.  Get vaccinated for HPV and hepatitis. If you have not received these vaccines in the past, talk to your health care provider about  whether one or both might be right for you.   If you are at risk of being infected with HIV, it is recommended that you take a prescription medicine daily to prevent HIV infection. This is called pre-exposure prophylaxis (PrEP). You are considered at risk if:  You are a man who has sex with other men (MSM).  You are a heterosexual man or woman and are sexually active with more than one partner.  You take drugs by injection.  You are sexually active with a partner who has HIV.  Talk with your health care provider about whether you are at high risk of being infected with HIV. If you choose to begin PrEP, you should first be tested for HIV. You should then be tested every 3 months for as long as you are taking PrEP.  WHAT SHOULD I DO IF I THINK I HAVE AN STD?  See your health care provider.   Tell your sexual partner(s). They should be tested and treated for any STDs.  Do not have sex until your health care provider says it is okay. WHEN SHOULD I GET IMMEDIATE MEDICAL CARE? Contact your health care provider right away if:   You have severe abdominal pain.  You are a man and notice swelling or pain in your testicles.  You are a woman and notice swelling or pain in your vagina. Document Released: 03/17/2002 Document Revised: 12/30/2012 Document Reviewed: 07/15/2012 [facility] Patient Information 2015 [place], [place]. This information is not intended to replace advice given to you by your health care provider. Make sure you discuss any questions you have with your health care provider.

## 2013-11-09 NOTE — ED Provider Notes (Signed)
CSN: [id number]     Arrival date & time 11/09/13  1731 History   First MD Initiated Contact with Patient 11/09/13 2031     Chief Complaint  Patient presents with  . Exposure to STD  . Erectile Dysfunction     (Consider location/radiation/quality/duration/timing/severity/associated sxs/prior Treatment) HPI Comments: Patient presents to the emergency department with chief complaints of requesting a STD screen. He also would like to be evaluated for erectile dysfunction. He states that he is able to get a normal erection as long as he does not take his pain medicine and muscle relaxers. He denies any pain in testicles. States that he does have some dysuria, but has not noticed any discharge. He denies any bowel or bladder incontinence. He has not taken anything to alleviate his symptoms.  The history is provided by the patient. No language interpreter was used.    Past Medical History  Diagnosis Date  . Schizophrenia   . Psychosis   . Insomnia due to medical condition   . Chronic pain   . Headache(784.0)   . Weakness     occasionally in both arms and hands;related to neck issues  . Hemorrhoids    Past Surgical History  Procedure Laterality Date  . Posterior cervical fusion/foraminotomy N/A 11/19/2012    Procedure: CERVICAL TWO TO CERVICAL SEVEN POSTERIOR CERVICAL FUSION/FORAMINOTOMY LEVEL 5;  Surgeon: Mariam DollarGary P Cram, MD;  Location: [facility];  Service: Neurosurgery;  Laterality: N/A;  C2-7 posteior cervical arthrodesis with instrumentation  . Hardware removal N/A 06/24/2013    Procedure: EXPLORATION/HARDWARE REMOVAL CERVICAL FOUR-FIVE, CERVICAL FIVE-SIX, AND CERVICAL SIX-SEVEN.;  Surgeon: Mariam DollarGary P Cram, MD;  Location: [facility];  Service: Neurosurgery;  Laterality: N/A;  posterior    Family History  Problem Relation Age of Onset  . High blood pressure Maternal Grandmother    History  Substance Use Topics  . Smoking status: Current Every Day Smoker -- 1.00 packs/day for 7 years     Types: Cigarettes  . Smokeless tobacco: Never Used  . Alcohol Use: Yes     Comment: occ    Review of Systems  Constitutional: Negative for fever and chills.  Respiratory: Negative for shortness of breath.   Cardiovascular: Negative for chest pain.  Gastrointestinal: Negative for nausea, vomiting, diarrhea and constipation.  Genitourinary: Positive for dysuria.       Erectile dysfunction      Allergies  Penicillins  Home Medications   Prior to Admission medications   Medication Sig Start Date End Date Taking? Authorizing Provider  cyclobenzaprine (FLEXERIL) 10 MG tablet Take 1 tablet (10 mg total) by mouth 3 (three) times daily as needed for muscle spasms. 06/26/13   Mariam DollarGary P Cram, MD  gabapentin (NEURONTIN) 100 MG capsule Take 100 mg by mouth 3 (three) times daily.    Historical Provider, MD  methylPREDNIsolone (MEDROL DOSPACK) 4 MG tablet follow package directions 06/26/13   Mariam DollarGary P Cram, MD  oxyCODONE (ROXICODONE) 15 MG immediate release tablet Take 0.5 tablets (7.5 mg total) by mouth every 4 (four) hours as needed for moderate pain. 06/26/13   Mariam DollarGary P Cram, MD  oxyCODONE-acetaminophen (PERCOCET) 10-325 MG per tablet Take 1 tablet by mouth every 4 (four) hours as needed for pain.    Historical Provider, MD  risperiDONE microspheres (RISPERDAL CONSTA) 25 MG injection Inject 25 mg into the muscle every 14 (fourteen) days.    Historical Provider, MD   BP 151/99 mmHg  Pulse 60  Temp(Src) 98.3 F (36.8 C) (Oral)  Resp 20  SpO2 99% Physical Exam  Constitutional: He is oriented to person, place, and time. He appears well-developed and well-nourished.  HENT:  Head: Normocephalic and atraumatic.  Eyes: Conjunctivae and EOM are normal.  Neck: Normal range of motion.  Cardiovascular: Normal rate.   Pulmonary/Chest: Effort normal.  Abdominal: He exhibits no distension.  Musculoskeletal: Normal range of motion.  Neurological: He is alert and oriented to person, place, and time.   Skin: Skin is dry.  Psychiatric: He has a normal mood and affect. His behavior is normal. Judgment and thought content normal.  Nursing note and vitals reviewed.   ED Course  Procedures (including critical care time) Results for orders placed or performed during the hospital encounter of 11/09/13  Urinalysis, Routine w reflex microscopic  Result Value Ref Range   Color, Urine YELLOW YELLOW   APPearance HAZY (A) CLEAR   Specific Gravity, Urine 1.020 1.005 - 1.030   pH 6.5 5.0 - 8.0   Glucose, UA NEGATIVE NEGATIVE mg/dL   Hgb urine dipstick NEGATIVE NEGATIVE   Bilirubin Urine NEGATIVE NEGATIVE   Ketones, ur NEGATIVE NEGATIVE mg/dL   Protein, ur NEGATIVE NEGATIVE mg/dL   Urobilinogen, UA 1.0 0.0 - 1.0 mg/dL   Nitrite NEGATIVE NEGATIVE   Leukocytes, UA NEGATIVE NEGATIVE   No results found.   Imaging Review No results found.   EKG Interpretation None      MDM   Final diagnoses:  Dysuria  Drug-induced erectile dysfunction    Patient will be treated prophylactically for STD. Erectile dysfunction only occurs when taking pain medicine and muscle relaxer. Suspect that this medication induced. Will give urology follow-up. Patient is stable and ready for discharge.    Roxy Horsemanobert Sindee Stucker, PA-C 11/09/13 2112

## 2013-11-11 LAB — GC/CHLAMYDIA PROBE AMP
CT Probe RNA: NEGATIVE
GC PROBE AMP APTIMA: NEGATIVE

## 2013-11-28 ENCOUNTER — Emergency Department (HOSPITAL_COMMUNITY)
Admission: EM | Admit: 2013-11-28 | Discharge: 2013-11-28 | Disposition: A | Discharge status: Home / Self Care | Payer: Medicaid Other | Attending: Emergency Medicine | Admitting: Emergency Medicine

## 2013-11-28 ENCOUNTER — Encounter (HOSPITAL_COMMUNITY): Payer: Self-pay | Admitting: Emergency Medicine

## 2013-11-28 ENCOUNTER — Emergency Department (HOSPITAL_COMMUNITY): Payer: Medicaid Other

## 2013-11-28 DIAGNOSIS — S4992XA Unspecified injury of left shoulder and upper arm, initial encounter: Secondary | ICD-10-CM | POA: Diagnosis present

## 2013-11-28 DIAGNOSIS — Z880 Allergy status to penicillin: Secondary | ICD-10-CM | POA: Diagnosis not present

## 2013-11-28 DIAGNOSIS — S199XXA Unspecified injury of neck, initial encounter: Secondary | ICD-10-CM | POA: Insufficient documentation

## 2013-11-28 DIAGNOSIS — G4700 Insomnia, unspecified: Secondary | ICD-10-CM | POA: Insufficient documentation

## 2013-11-28 DIAGNOSIS — Y9389 Activity, other specified: Secondary | ICD-10-CM | POA: Insufficient documentation

## 2013-11-28 DIAGNOSIS — Z7952 Long term (current) use of systemic steroids: Secondary | ICD-10-CM | POA: Diagnosis not present

## 2013-11-28 DIAGNOSIS — Y998 Other external cause status: Secondary | ICD-10-CM | POA: Insufficient documentation

## 2013-11-28 DIAGNOSIS — M25512 Pain in left shoulder: Secondary | ICD-10-CM

## 2013-11-28 DIAGNOSIS — M542 Cervicalgia: Secondary | ICD-10-CM

## 2013-11-28 DIAGNOSIS — Z79899 Other long term (current) drug therapy: Secondary | ICD-10-CM | POA: Diagnosis not present

## 2013-11-28 DIAGNOSIS — Y9289 Other specified places as the place of occurrence of the external cause: Secondary | ICD-10-CM | POA: Insufficient documentation

## 2013-11-28 DIAGNOSIS — Z720 Tobacco use: Secondary | ICD-10-CM | POA: Insufficient documentation

## 2013-11-28 DIAGNOSIS — G8929 Other chronic pain: Secondary | ICD-10-CM | POA: Insufficient documentation

## 2013-11-28 DIAGNOSIS — W228XXA Striking against or struck by other objects, initial encounter: Secondary | ICD-10-CM | POA: Diagnosis not present

## 2013-11-28 MED ORDER — OXYCODONE-ACETAMINOPHEN 5-325 MG PO TABS
2.0000 | ORAL_TABLET | Freq: Once | ORAL | Status: AC
  Administered 2013-11-28: 2 via ORAL
  Filled 2013-11-28: qty 2

## 2013-11-28 MED ORDER — IBUPROFEN 200 MG PO TABS
600.0000 mg | ORAL_TABLET | Freq: Once | ORAL | Status: AC
  Administered 2013-11-28: 600 mg via ORAL
  Filled 2013-11-28 (×2): qty 1
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
## 2013-11-28 NOTE — Discharge Instructions (Signed)
Acromioclavicular Injuries °The AC (acromioclavicular) joint is the joint in the shoulder where the collarbone (clavicle) meets the shoulder blade (scapula). The part of the shoulder blade connected to the collarbone is called the acromion. Common problems with and treatments for the AC joint are detailed below. °ARTHRITIS °Arthritis occurs when the joint has been injured and the smooth padding between the joints (cartilage) is lost. This is the wear and tear seen in most joints of the body if they have been overused. This causes the joint to produce pain and swelling which is worse with activity.  °AC JOINT SEPARATION °AC joint separation means that the ligaments connecting the acromion of the shoulder blade and collarbone have been damaged, and the two bones no longer line up. AC separations can be anywhere from mild to severe, and are "graded" depending upon which ligaments are torn and how badly they are torn. °· Grade I Injury: the least damage is done, and the AC joint still lines up. °· Grade II Injury: damage to the ligaments which reinforce the AC joint. In a Grade II injury, these ligaments are stretched but not entirely torn. When stressed, the AC joint becomes painful and unstable. °· Grade III Injury: AC and secondary ligaments are completely torn, and the collarbone is no longer attached to the shoulder blade. This results in deformity; a prominence of the end of the clavicle. °AC JOINT FRACTURE °AC joint fracture means that there has been a break in the bones of the AC joint, usually the end of the clavicle. °TREATMENT °TREATMENT OF AC ARTHRITIS °· There is currently no way to replace the cartilage damaged by arthritis. The best way to improve the condition is to decrease the activities which aggravate the problem. Application of ice to the joint helps decrease pain and soreness (inflammation). The use of non-steroidal anti-inflammatory medication is helpful. °· If less conservative measures do not  work, then cortisone shots (injections) may be used. These are anti-inflammatories; they decrease the soreness in the joint and swelling. °· If non-surgical measures fail, surgery may be recommended. The procedure is generally removal of a portion of the end of the clavicle. This is the part of the collarbone closest to your acromion which is stabilized with ligaments to the acromion of the shoulder blade. This surgery may be performed using a tube-like instrument with a light (arthroscope) for looking into a joint. It may also be performed as an open surgery through a small incision by the surgeon. Most patients will have good range of motion within 6 weeks and may return to all activity including sports by 8-12 weeks, barring complications. °TREATMENT OF AN AC SEPARATION °· The initial treatment is to decrease pain. This is best accomplished by immobilizing the arm in a sling and placing an ice pack to the shoulder for 20 to 30 minutes every 2 hours as needed. As the pain starts to subside, it is important to begin moving the fingers, wrist, elbow and eventually the shoulder in order to prevent a stiff or "frozen" shoulder. Instruction on when and how much to move the shoulder will be provided by your caregiver. The length of time needed to regain full motion and function depends on the amount or grade of the injury. Recovery from a Grade I AC separation usually takes 10 to 14 days, whereas a Grade III may take 6 to 8 weeks. °· Grade I and II separations usually do not require surgery. Even Grade III injuries usually allow return to full   activity with few restrictions. Treatment is also based on the activity demands of the injured shoulder. For example, a high level quarterback with an injured throwing arm will receive more aggressive treatment than someone with a desk job who rarely uses his/her arm for strenuous activities. In some cases, a painful lump may persist which could require a later surgery. Surgery  can be very successful, but the benefits must be weighed against the potential risks. °TREATMENT OF AN AC JOINT FRACTURE °Fracture treatment depends on the type of fracture. Sometimes a splint or sling may be all that is required. Other times surgery may be required for repair. This is more frequently the case when the ligaments supporting the clavicle are completely torn. Your caregiver will help you with these decisions and together you can decide what will be the best treatment. °HOME CARE INSTRUCTIONS  °· Apply ice to the injury for 15-20 minutes each hour while awake for 2 days. Put the ice in a plastic bag and place a towel between the bag of ice and skin. °· If a sling has been applied, wear it constantly for as long as directed by your caregiver, even at night. The sling or splint can be removed for bathing or showering or as directed. Be sure to keep the shoulder in the same place as when the sling is on. Do not lift the arm. °· If a figure-of-eight splint has been applied it should be tightened gently by another person every day. Tighten it enough to keep the shoulders held back. Allow enough room to place the index finger between the body and strap. Loosen the splint immediately if there is numbness or tingling in the hands. °· Take over-the-counter or prescription medicines for pain, discomfort or fever as directed by your caregiver. °· If you or your child has received a follow up appointment, it is very important to keep that appointment in order to avoid long term complications, chronic pain or disability. °SEEK MEDICAL CARE IF:  °· The pain is not relieved with medications. °· There is increased swelling or discoloration that continues to get worse rather than better. °· You or your child has been unable to follow up as instructed. °· There is progressive numbness and tingling in the arm, forearm or hand. °SEEK IMMEDIATE MEDICAL CARE IF:  °· The arm is numb, cold or pale. °· There is increasing pain  in the hand, forearm or fingers. °MAKE SURE YOU:  °· Understand these instructions. °· Will watch your condition. °· Will get help right away if you are not doing well or get worse. °Document Released: 10/04/2004 Document Revised: 03/19/2011 Document Reviewed: 03/29/2008 °ExitCare® Patient Information ©2015 [employer]. This information is not intended to replace advice given to you by your health care provider. Make sure you discuss any questions you have with your health care provider. ° °
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
## 2013-12-08 ENCOUNTER — Emergency Department (HOSPITAL_COMMUNITY)
Admission: EM | Admit: 2013-12-08 | Discharge: 2013-12-08 | Disposition: A | Discharge status: Home / Self Care | Payer: Medicaid Other | Attending: Emergency Medicine | Admitting: Emergency Medicine

## 2013-12-08 ENCOUNTER — Encounter (HOSPITAL_COMMUNITY): Payer: Self-pay | Admitting: Emergency Medicine

## 2013-12-08 DIAGNOSIS — Z880 Allergy status to penicillin: Secondary | ICD-10-CM | POA: Diagnosis not present

## 2013-12-08 DIAGNOSIS — X58XXXA Exposure to other specified factors, initial encounter: Secondary | ICD-10-CM | POA: Insufficient documentation

## 2013-12-08 DIAGNOSIS — Z79899 Other long term (current) drug therapy: Secondary | ICD-10-CM | POA: Insufficient documentation

## 2013-12-08 DIAGNOSIS — Z8669 Personal history of other diseases of the nervous system and sense organs: Secondary | ICD-10-CM | POA: Diagnosis not present

## 2013-12-08 DIAGNOSIS — Z8719 Personal history of other diseases of the digestive system: Secondary | ICD-10-CM | POA: Insufficient documentation

## 2013-12-08 DIAGNOSIS — Y93E1 Activity, personal bathing and showering: Secondary | ICD-10-CM | POA: Diagnosis not present

## 2013-12-08 DIAGNOSIS — Z720 Tobacco use: Secondary | ICD-10-CM | POA: Insufficient documentation

## 2013-12-08 DIAGNOSIS — G8929 Other chronic pain: Secondary | ICD-10-CM | POA: Diagnosis not present

## 2013-12-08 DIAGNOSIS — Y92091 Bathroom in other non-institutional residence as the place of occurrence of the external cause: Secondary | ICD-10-CM | POA: Diagnosis not present

## 2013-12-08 DIAGNOSIS — Y998 Other external cause status: Secondary | ICD-10-CM | POA: Diagnosis not present

## 2013-12-08 DIAGNOSIS — T7840XA Allergy, unspecified, initial encounter: Secondary | ICD-10-CM

## 2013-12-08 DIAGNOSIS — W182XXA Fall in (into) shower or empty bathtub, initial encounter: Secondary | ICD-10-CM | POA: Diagnosis not present

## 2013-12-08 DIAGNOSIS — F209 Schizophrenia, unspecified: Secondary | ICD-10-CM | POA: Diagnosis not present

## 2013-12-08 MED ORDER — OXYCODONE HCL 5 MG PO TABS
15.0000 mg | ORAL_TABLET | Freq: Once | ORAL | Status: AC
  Administered 2013-12-08: 15 mg via ORAL
  Filled 2013-12-08: qty 3

## 2013-12-08 MED ORDER — PREDNISONE 20 MG PO TABS
60.0000 mg | ORAL_TABLET | Freq: Once | ORAL | Status: AC
  Administered 2013-12-08: 60 mg via ORAL
  Filled 2013-12-08: qty 3

## 2013-12-08 MED ORDER — PREDNISONE 20 MG PO TABS: 40.0000 mg | ORAL_TABLET | Freq: Every day | ORAL | Status: DC

## 2013-12-08 NOTE — Discharge Instructions (Signed)
Allergies  Allergies may happen from anything your body is sensitive to. This may be food, medicines, pollens, chemicals, and many other things. Food allergies can be severe and deadly.  HOME CARE  If you do not know what causes a reaction, keep a diary. Write down the foods you ate and the symptoms that followed. Avoid foods that cause reactions.  If you have red raised spots (hives) or a rash:  Take medicine as told by your doctor.  Use medicines for red raised spots and itching as needed.  Apply cold cloths (compresses) to the skin. Take a cool bath. Avoid hot baths or showers.  If you are severely allergic:  It is often necessary to go to the hospital after you have treated your reaction.  Wear your medical alert jewelry.  You and your family must learn how to give a allergy shot or use an allergy kit (anaphylaxis kit).  Always carry your allergy kit or shot with you. Use this medicine as told by your doctor if a severe reaction is occurring. GET HELP RIGHT AWAY IF:  You have trouble breathing or are making high-pitched whistling sounds (wheezing).  You have a tight feeling in your chest or throat.  You have a puffy (swollen) mouth.  You have red raised spots, puffiness (swelling), or itching all over your body.  You have had a severe reaction that was helped by your allergy kit or shot. The reaction can return once the medicine has worn off.  You think you are having a food allergy. Symptoms most often happen within 30 minutes of eating a food.  Your symptoms have not gone away within 2 days or are getting worse.  You have new symptoms.  You want to retest yourself with a food or drink you think causes an allergic reaction. Only do this under the care of a doctor. MAKE SURE YOU:   Understand these instructions.  Will watch your condition.  Will get help right away if you are not doing well or get worse. Document Released: 04/21/2012 Document Reviewed:  04/21/2012 [facility] Patient Information 2015 [employer]. This information is not intended to replace advice given to you by your health care provider. Make sure you discuss any questions you have with your health care provider. You can take Benadryl as well for additional symptoms rel;ief    Emergency Department Resource Guide 1) Find a Doctor and Pay Out of Pocket Although you won't have to find out who is covered by your insurance plan, it is a good idea to ask around and get recommendations. You will then need to call the office and see if the doctor you have chosen will accept you as a new patient and what types of options they offer for patients who are self-pay. Some doctors offer discounts or will set up payment plans for their patients who do not have insurance, but you will need to ask so you aren't surprised when you get to your appointment.  2) Contact Your Local Health Department Not all health departments have doctors that can see patients for sick visits, but many do, so it is worth a call to see if yours does. If you don't know where your local health department is, you can check in your phone book. The CDC also has a tool to help you locate your state's health department, and many state websites also have listings of all of their local health departments.  3) Find a [facility] If your illness is  not likely to be very severe or complicated, you may want to try a walk in clinic. These are popping up all over the country in pharmacies, drugstores, and shopping centers. They're usually staffed by nurse practitioners or physician assistants that have been trained to treat common illnesses and complaints. They're usually fairly quick and inexpensive. However, if you have serious medical issues or chronic medical problems, these are probably not your best option.  No Primary Care Doctor: - Call Health Connect at  [phone] - they can help you locate a primary care doctor that   accepts your insurance, provides certain services, etc. - Physician Referral Service- [phone]  Chronic Pain Problems: Organization         Address  Phone   Notes  [facility]  [phone] Patients need to be referred by their primary care doctor.   Medication Assistance: Organization         Address  Phone   Notes  [facility] Medication [facility] [address] [place], [place] [address] [phone] --Must be a resident of [facility] -- Must have NO insurance coverage whatsoever (no Medicaid/ Medicare, etc.) -- The pt. MUST have a primary care doctor that directs their care regularly and follows them in the community   MedAssist  [phone]   [employer]  [phone]    Agencies that provide inexpensive medical care: Organization         Address  Phone   Notes  [place]  [phone]   [facility]    [phone]   [facility] [place], [place] [address] [phone]   [place] [address] [place] [phone]   Planned Parenthood    [phone]   [facility]    [phone]   [place] and [place] [address], [place] Phone:  [phone], Fax:  [phone] Hours of Operation:  9 am - 6 pm, M-F.  Also accepts Medicaid/Medicare and self-pay.  [facility] [place] [facility] for [place], [address], [place] Phone: [phone], Fax: [phone]. Hours of Operation:  8:30 am - 5:30 pm, M-F.  Also accepts Medicaid and self-pay.  [facility] [place] [address] [place] Phone: [phone]   [place], [place], [place] [phone], Ext. [phone] Mondays & Thursdays: 7-9 AM.  First 15 patients are seen on a first come, first serve basis.    [place] Providers:  Organization          Address  Phone   Notes  [facility] [address], [place] [phone] Also accepts self-pay patients.  [facility] [address], [place]  [phone]   [place], [address], [place] [phone]   [facility] [address], [place] [phone]   Lucianne Lei [address], [place]   [phone] Only accepts [place] Access [place] patients after they have their name applied to their card.   Self-Pay (no insurance) in [facility]:  Organization         Address  Phone   Notes  Sickle Cell Patients, [facility] [place], [place] [phone]   Gershon Mussel  [facility] [place] [phone]   [facility] [place]  [place], [place], [place] [phone]   [facility]/Dr. Osei-Bonsu  [address] [place] or [address], [place] [phone] Phone number for both [place] and [place] locations is the same.  Urgent Medical and [facility] [address] [place] [phone]   [facility] [address] [place] or [address] [phone]   [facility] [address] [place] [phone], phone; [phone], fax Sees patients 1st and 3rd Saturday of every month.  Must not qualify for public or private insurance (i.e. Medicaid, Medicare, [place] Health Choice, Veterans' Benefits)  Household income should be no more than 200% of the poverty level The clinic cannot treat you if you are pregnant or think you are pregnant  Sexually transmitted diseases are not treated at the clinic.    Dental Care: Organization         Address  Phone  Notes  [facility] [place] Department of [facility] [place] [phone] Accepts children up to age 88 who are enrolled in [place] or [place]; pregnant women with a Medicaid card; and children who have applied for Medicaid or [place] Health Choice, but were declined, whose parents can pay a reduced fee at time of service.  [facility] [place] Department of [facility]  [address], [place] [phone] Accepts children up to age 39 who are enrolled in [place] or [place]; pregnant women with a Medicaid card; and children who have applied for Medicaid or [place] Health Choice, but were declined, whose parents can pay a reduced fee at time of service.  [place] Adult Dental Access PROGRAM  [place] [phone] Patients are seen by appointment only. Walk-ins are not accepted. [place] will see patients 58 years of age and older. Monday - Tuesday (8am-5pm) Most Wednesdays (8:30-5pm) $30 per visit, cash only  [facility] Adult Dental Access PROGRAM  [address], [facility] [phone] Patients are seen by appointment only. Walk-ins are not accepted. [place] will see patients 38 years of age and older. One Wednesday Evening (Monthly: Volunteer Based).  $30 per visit, cash only  [place]  [phone] for adults; Children under age 41, call Graduate Pediatric Dentistry at [phone]. Children aged 35-14, please call [phone] to request a pediatric application.  Dental services are provided in all areas of dental care including fillings, crowns and bridges, complete and partial dentures, implants, gum treatment, root canals, and extractions. Preventive care is also provided. Treatment is provided to both adults and children. Patients are selected via a lottery and there is often a waiting list.   [facility] [address], [place]  [phone] [email or web address]   [facility] [address] [place], [place]  [phone], Ext. [phone] Second and Fourth Thursday of each month, opens at 6:30 AM; Clinic ends at 9 AM.  Patients are seen on a first-come first-served basis, and a limited number are seen during each clinic.   [facility]  [address] Hillard Danker [place], [place] [phone]   Eligibility Requirements You must have lived in [place], [place], or [place] counties for at least the last  three months.   You cannot be eligible for state or federal sponsored [employer], including [employer], [place], or [employer].   You generally cannot be eligible for healthcare insurance through your employer.    How to apply: Eligibility screenings are held every Tuesday and Wednesday afternoon from 1:00 pm until 4:00 pm. You do not need an appointment for the interview!  [facility] [address], [place], [place]   [place]  [place] Department  [place]  [phone]    Behavioral Health Resources in the Community: Intensive Outpatient Programs Organization         Address  Phone  Notes  [place]. [address] [place], [place] [phone]   [facility] Outpatient [address] [place], [place]   ADS: Alcohol & Drug Svcs [address], [place], [place]   [place] [address],  [place], [place] or [phone]   Substance Abuse Resources Organization         Address  Phone  Notes  Alcohol and Drug Services  [phone]   [place]  [phone]   [facility] [place]   Chinita Pester  [phone]   Residential & Outpatient Substance Abuse Program  [phone]   Psychological Services Organization         Address  Phone  Notes  [facility] [place]  [place]  [phone]    [place] [address] [place] or [phone]    Mobile Crisis Teams Organization         Address  Phone  Notes  [facility]atives, Mobile Crisis Care Unit  [phone]   Assertive Psychotherapeutic Services  [address] [place], [place]   [place] [address], [place] [phone]    Self-Help/Support Groups Organization         Address  Phone             Notes  [place]. of [place] - variety of support groups  [place] Call for more information  Narcotics Anonymous (NA), Caring [facility] [address], [employer] [place]  2 meetings at this location   Special educational needs teacher         Address  Phone  Notes  ASAP Residential Treatment [place],    [place]  [phone]   [facility]  [address], [place] [phone], [place], [place]   [place], [place] [phone] Admissions: 8am-3pm M-F  Incentives Substance [place] [address]    [place], [place] [phone]   [facility] [address] [place], [place], [place]   [facility] [address],  [place], [place]   Insight Programs - Intensive Outpatient [address] Kristeen [address], [place], [place]   [facility] ([place]  [place], [place] [phone] or [phone]   Residential Treatment Services (RTS) [address] [place], [place] Accepts Medicaid  [facility] [place] [address]  [place] [phone] Substance Abuse/Addiction Treatment   [facility] Organization         Address  Phone  Notes  [facility]  [phone]   Domenic Schwab,  PhD [address] Arlis Porta [place], [place]   [phone] or (336) [place]  [place], [place] [phone]   [address], [place], [place] [phone] Insurance/Medicaid/sponsorship through [facility] and Families [address], [address] [place]                                    [place], [place] [phone] [place] [address], [place] [phone]    Dr. Adele Schilder  [phone]   [facility] Dept. 1) [address] [place] 2) [place] 3)  [address], [place] [phone]  [phone]   [place] [phone] or [phone] (After Hours)

## 2013-12-08 NOTE — ED Notes (Signed)
Pt states tonight he took a shower and used Axe body wash for the first time  Pt states his skin started burning and he started getting hives  Pt states his face started feeling tight and he started feeling short of breath  Pt states he got dizzy and "fell" in the bathtub  Pt states he reshowered using Dove soap and that helped calm the burning and the hives  Pt is in no acute distress in triage and denies having difficulty swallowing  Pt states he did vomit and has had one episode of diarrhea  Pt states he has chronic neck and back pain and is having a lot of pain from all the scratching

## 2013-12-08 NOTE — ED Provider Notes (Signed)
CSN: [id number]     Arrival date & time 12/08/13  [id number] History   First MD Initiated Contact with Patient 12/08/13 0201     Chief Complaint  Patient presents with  . Allergic Reaction     (Consider location/radiation/quality/duration/timing/severity/associated sxs/prior Treatment) HPI Comments: Patient is a new body wash immediately developed redness, itching, hives since of shortness of breath, lightheadedness, nausea with one episode of vomiting.  He states he fell in the bathtub exacerbating his chronic back pain.  His girlfriend gave him some liquid Benadryl.  By the time he arrived in the emergency department, his rash and hives had dissipated.  He was no longer feeling lightheaded or short of breath.  Patient is a 43 y.o. male presenting with allergic reaction. The history is provided by the patient.  Allergic Reaction Presenting symptoms: difficulty breathing, itching and rash   Presenting symptoms: no difficulty swallowing and no wheezing   Difficulty breathing:    Severity:  Mild   Onset quality:  Sudden   Timing:  Constant   Progression:  Resolved Itching:    Location:  Full body   Severity:  Mild   Onset quality:  Gradual   Progression:  Resolved Severity:  Mild Prior allergic episodes:  Allergies to medications Context comment:  Used Axe body wash Relieved by:  Antihistamines   Past Medical History  Diagnosis Date  . Schizophrenia   . Psychosis   . Insomnia due to medical condition   . Chronic pain   . Headache(784.0)   . Weakness     occasionally in both arms and hands;related to neck issues  . Hemorrhoids    Past Surgical History  Procedure Laterality Date  . Posterior cervical fusion/foraminotomy N/A 11/19/2012    Procedure: CERVICAL TWO TO CERVICAL SEVEN POSTERIOR CERVICAL FUSION/FORAMINOTOMY LEVEL 5;  Surgeon: Mariam DollarGary P Cram, MD;  Location: [facility];  Service: Neurosurgery;  Laterality: N/A;  C2-7 posteior cervical arthrodesis with instrumentation  .  Hardware removal N/A 06/24/2013    Procedure: EXPLORATION/HARDWARE REMOVAL CERVICAL FOUR-FIVE, CERVICAL FIVE-SIX, AND CERVICAL SIX-SEVEN.;  Surgeon: Mariam DollarGary P Cram, MD;  Location: [facility];  Service: Neurosurgery;  Laterality: N/A;  posterior    Family History  Problem Relation Age of Onset  . High blood pressure Maternal Grandmother   . Cancer Other    History  Substance Use Topics  . Smoking status: Current Every Day Smoker -- 1.00 packs/day for 7 years    Types: Cigarettes  . Smokeless tobacco: Never Used  . Alcohol Use: Yes     Comment: occ    Review of Systems  Constitutional: Negative for fever.  HENT: Negative for trouble swallowing.   Respiratory: Positive for shortness of breath. Negative for wheezing.   Musculoskeletal: Positive for back pain and arthralgias.  Skin: Positive for itching and rash.  Neurological: Positive for light-headedness.  All other systems reviewed and are negative.     Allergies  Penicillins  Home Medications   Prior to Admission medications   Medication Sig Start Date End Date Taking? Authorizing Provider  ibuprofen (ADVIL,MOTRIN) 200 MG tablet Take 400 mg by mouth every 6 (six) hours as needed (for pain.).    Yes Historical Provider, MD  oxyCODONE (ROXICODONE) 15 MG immediate release tablet Take 0.5 tablets (7.5 mg total) by mouth every 4 (four) hours as needed for moderate pain. 06/26/13  Yes Mariam DollarGary P Cram, MD  risperiDONE microspheres (RISPERDAL CONSTA) 25 MG injection Inject 25 mg into the muscle every 30 (thirty)  days.    Yes Historical Provider, MD  carisoprodol (SOMA) 350 MG tablet Take 350 mg by mouth 4 (four) times daily as needed for muscle spasms.    Historical Provider, MD  cyclobenzaprine (FLEXERIL) 10 MG tablet Take 1 tablet (10 mg total) by mouth 3 (three) times daily as needed for muscle spasms. 06/26/13   Mariam DollarGary P Cram, MD  methylPREDNIsolone (MEDROL DOSPACK) 4 MG tablet follow package directions Patient not taking: Reported on  12/08/2013 06/26/13   Mariam DollarGary P Cram, MD  predniSONE (DELTASONE) 20 MG tablet Take 2 tablets (40 mg total) by mouth daily. 12/08/13   Arman FilterGail K Averil Digman, NP   BP 124/71 mmHg  Pulse 93  Temp(Src) 97.4 F (36.3 C) (Oral)  Resp 20  SpO2 100% Physical Exam  Constitutional: He appears well-developed and well-nourished.  HENT:  Head: Normocephalic.  Right Ear: External ear normal.  Left Ear: External ear normal.  Mouth/Throat: Oropharynx is clear and moist.  Eyes: Pupils are equal, round, and reactive to light.  Neck: Normal range of motion.  Cardiovascular: Normal rate and regular rhythm.   Pulmonary/Chest: Effort normal and breath sounds normal.  Abdominal: Soft. He exhibits no distension. There is no tenderness.  Musculoskeletal: He exhibits no edema.  Lymphadenopathy:    He has no cervical adenopathy.  Skin: Skin is warm and dry. No rash noted.  Nursing note and vitals reviewed.   ED Course  Procedures (including critical care time) Labs Review Labs Reviewed - No data to display  Imaging Review No results found.   EKG Interpretation None      MDM   Final diagnoses:  Allergic reaction, initial encounter         Arman FilterGail K Tiana Sivertson, NP 12/08/13 [id number]  Olivia Mackielga M Otter, MD 12/08/13 [phone]

## 2014-02-04 ENCOUNTER — Ambulatory Visit: Payer: No Typology Code available for payment source | Admitting: Physical Therapy

## 2014-02-05 ENCOUNTER — Ambulatory Visit: Payer: Medicaid Other | Attending: Neurosurgery | Admitting: Physical Therapy

## 2014-02-05 DIAGNOSIS — M542 Cervicalgia: Secondary | ICD-10-CM

## 2014-02-05 DIAGNOSIS — Z981 Arthrodesis status: Secondary | ICD-10-CM | POA: Insufficient documentation
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
## 2014-02-05 NOTE — Patient Instructions (Signed)
Extensors, Supine   Lie supine, head on small, rolled towel. Gently tuck chin and bring toward chest. Hold 5___ seconds. Repeat __10_ times per session. Do _3__ sessions per day.  Copyright  VHI. All rights reserved.  Flexibility: Neck Retraction   Pull head straight back, keeping eyes and jaw level. Repeat __10__ times per set. Do _1___ sets per session. Do __2__ sessions per day.  http://orth.exer.us/344   Copyright  VHI. All rights reserved.

## 2014-02-08 ENCOUNTER — Telehealth: Payer: Self-pay | Admitting: *Deleted

## 2014-02-08 NOTE — Telephone Encounter (Signed)
Pt called to schedule his appts. Pt is aware that he needs to sign a self waiver pay at his next visit....td

## 2014-02-15 ENCOUNTER — Encounter: Payer: Medicaid Other | Admitting: Physical Therapy

## 2014-02-18 ENCOUNTER — Ambulatory Visit: Payer: Medicaid Other | Attending: Neurosurgery | Admitting: Physical Therapy

## 2014-02-18 DIAGNOSIS — M542 Cervicalgia: Secondary | ICD-10-CM | POA: Insufficient documentation

## 2014-02-18 DIAGNOSIS — Z981 Arthrodesis status: Secondary | ICD-10-CM | POA: Insufficient documentation

## 2014-02-22 ENCOUNTER — Ambulatory Visit: Payer: Medicaid Other | Admitting: Physical Therapy

## 2014-02-25 ENCOUNTER — Ambulatory Visit: Payer: Medicaid Other | Admitting: Physical Therapy

## 2014-02-25 DIAGNOSIS — Z981 Arthrodesis status: Secondary | ICD-10-CM | POA: Diagnosis not present

## 2014-02-25 DIAGNOSIS — M542 Cervicalgia: Secondary | ICD-10-CM | POA: Diagnosis not present

## 2014-02-25 NOTE — Patient Instructions (Signed)
Self care.  May use rolled towel on pillow for support and comfort.  Remove tape if irritating scar.

## 2014-02-25 NOTE — Therapy (Signed)
[facility] Outpatient Rehabilitation [facility] [address] [place], [place], [address] Phone: [phone]   Fax:  [phone]  Physical Therapy Treatment  Patient Details  Name: Roger Woods MRN: [id number] Date of Birth: [birth year] Referring Provider:  Mariam Dollar, MD  Encounter Date: 02/25/2014      PT End of Session - 02/25/14 1742    Visit Number 2   Number of Visits 16   Date for PT Re-Evaluation 04/02/14   PT Start Time 1655   PT Stop Time 1730   PT Time Calculation (min) 35 min   Activity Tolerance Patient tolerated treatment well;Patient limited by pain      Past Medical History  Diagnosis Date  . Schizophrenia   . Psychosis   . Insomnia due to medical condition   . Chronic pain   . Headache(784.0)   . Weakness     occasionally in both arms and hands;related to neck issues  . Hemorrhoids     Past Surgical History  Procedure Laterality Date  . Posterior cervical fusion/foraminotomy N/A 11/19/2012    Procedure: CERVICAL TWO TO CERVICAL SEVEN POSTERIOR CERVICAL FUSION/FORAMINOTOMY LEVEL 5;  Surgeon: Mariam Dollar, MD;  Location: [facility];  Service: Neurosurgery;  Laterality: N/A;  C2-7 posteior cervical arthrodesis with instrumentation  . Hardware removal N/A 06/24/2013    Procedure: EXPLORATION/HARDWARE REMOVAL CERVICAL FOUR-FIVE, CERVICAL FIVE-SIX, AND CERVICAL SIX-SEVEN.;  Surgeon: Mariam Dollar, MD;  Location: [facility];  Service: Neurosurgery;  Laterality: N/A;  posterior     There were no vitals taken for this visit.  Visit Diagnosis:  Cervicalgia      Subjective Assessment - 02/25/14 1707    Symptoms 25 minutes late.  Better today after neck retraction.  6/10  worse with driving.   Currently in Pain? Yes   Pain Score 6                     [facility] Adult PT Treatment/Exercise - 02/25/14 1705    Neck Exercises: Supine   Neck Retraction 5 reps;5 secs   Capital Flexion 5 reps;5 secs   Cervical Rotation 5 reps;Both    Lateral Flexion Both;5 reps   Theraband Level (UE D1) Level 3 (Green)   UE D1 Limitations supine   Theraband Level (UE D2) Level 3 (Green)   UE D2 Limitations supine   Other Supine Exercise ER green band supine   Other Supine Exercise elbow flexion, extension greenband   Manual Therapy   Manual Therapy --  tape trial distal scar for pain, posture                PT Education - 02/25/14 1741    Education provided Yes   Education Details Exercises from drawer for scapular unattached #1,2,3. TB strengthening #1,2,  Neck AROM #1234.   Person(s) Educated Patient   Methods Explanation;Demonstration;Handout   Comprehension Verbalized understanding          PT Short Term Goals - 02/25/14 1745    PT SHORT TERM GOAL #1   Title "Independent with initial HEP   Time 4   Period Weeks   Status On-going   PT SHORT TERM GOAL #2   Title Patient will report a 25% improvement in pain and overall function.   Time 4   Period Weeks   Status On-going   PT SHORT TERM GOAL #3   Title 4   Time 4   Period Weeks   Status Unable to  assess           PT Long Term Goals - 02/25/14 1745    PT LONG TERM GOAL #1   Title "Pt will be independent with advanced HEP.    Time 8   Period Weeks   Status On-going   PT LONG TERM GOAL #2   Title Cervical rotation ROM improved to 20 degrees needed for greater ease with driving.   Time 8   Period Weeks   Status On-going   PT LONG TERM GOAL #3   Title Bilateral shoulder flexion improved to 150 degrees needed for overhead reaching with home activities.     Time 8   Status On-going   PT LONG TERM GOAL #4   Title Neck Disability Index with improvement by 12 points indicating improved function with less pain.   Baseline 35 points   Time 8   Period Weeks               Problem List Patient Active Problem List   Diagnosis Date Noted  . Pseudoarthrosis of cervical spine 06/24/2013  . Hypersomnia with sleep apnea, unspecified  04/24/2013  . Narcotic drug use 04/24/2013  . Spondylosis, cervical, with myelopathy 04/24/2013  Liz BeachKaren Harris, PTA 02/25/2014 5:50 PM Phone: [phone] Fax: [phone]   [facility],KAREN 02/25/2014, 5:50 PM  [facility] [address] [place], [place], [address] Phone: [phone]   Fax:  [phone]

## 2014-03-10 ENCOUNTER — Ambulatory Visit: Payer: Medicaid Other | Attending: Neurosurgery | Admitting: Physical Therapy

## 2014-03-10 DIAGNOSIS — M542 Cervicalgia: Secondary | ICD-10-CM | POA: Insufficient documentation

## 2014-03-10 DIAGNOSIS — Z981 Arthrodesis status: Secondary | ICD-10-CM | POA: Insufficient documentation

## 2014-03-11 ENCOUNTER — Ambulatory Visit: Payer: Medicaid Other | Admitting: Physical Therapy

## 2014-03-16 ENCOUNTER — Ambulatory Visit: Payer: Medicaid Other | Admitting: Physical Therapy

## 2014-03-16 ENCOUNTER — Telehealth: Payer: Self-pay | Admitting: Physical Therapy

## 2014-03-16 NOTE — Telephone Encounter (Signed)
Left message regarding last 2 no-shows.  Requested for patient to call regarding last scheduled appt 3/10.

## 2014-03-18 ENCOUNTER — Ambulatory Visit: Payer: Medicaid Other | Admitting: Physical Therapy

## 2014-03-19 ENCOUNTER — Encounter: Payer: Self-pay | Admitting: Physical Therapy

## 2014-03-19 NOTE — Therapy (Signed)
[place], [place], [address] Phone: [phone]   Fax:  [phone]  Patient Details  Name: Roger Woods MRN: [id number] Date of Birth: 1970-02-06 Referring Provider:  No ref. provider found  Encounter Date: 03/19/2014  PHYSICAL THERAPY DISCHARGE SUMMARY  Visits from Start of Care: 2  Current functional level related to goals / functional outcomes: No progress toward goals.  Patient no-showed for last scheduled appointments.  Left phone message, no response   Remaining deficits: See initial evaluation  Education / Equipment: HEP Plan:                                                    Patient goals were not met. Patient is being discharged due to not returning since the last visit.  ?????     PT Long Term Goals - 02/25/14 1745    PT LONG TERM GOAL #1   Title "Pt will be independent with advanced HEP.    Time 8   Period Weeks   Status On-going   PT LONG TERM GOAL #2   Title Cervical rotation ROM improved to 20 degrees needed for greater ease with driving.   Time 8   Period Weeks   Status On-going   PT LONG TERM GOAL #3   Title Bilateral shoulder flexion improved to 150 degrees needed for overhead reaching with home activities.    Time 8   Status On-going   PT LONG TERM GOAL #4   Title Neck Disability Index with improvement by 12 points indicating improved function with less pain.   Baseline 35 points   Time 8   Period Weeks                  No goals met.  Alvera Singh 03/19/2014, 10:50 AM  [facility] [address] [place], [place], [address] Phone: [phone]   Fax:  [phone]  Ruben Im, PT 03/19/2014 10:53 AM Phone: [phone] Fax: [phone]

## 2014-08-02 ENCOUNTER — Emergency Department (HOSPITAL_COMMUNITY)
Admission: EM | Admit: 2014-08-02 | Discharge: 2014-08-02 | Disposition: A | Discharge status: Home / Self Care | Payer: Medicaid Other | Attending: Physician Assistant | Admitting: Physician Assistant

## 2014-08-02 ENCOUNTER — Encounter (HOSPITAL_COMMUNITY): Payer: Self-pay | Admitting: Emergency Medicine

## 2014-08-02 DIAGNOSIS — Z8719 Personal history of other diseases of the digestive system: Secondary | ICD-10-CM | POA: Diagnosis not present

## 2014-08-02 DIAGNOSIS — Z720 Tobacco use: Secondary | ICD-10-CM | POA: Insufficient documentation

## 2014-08-02 DIAGNOSIS — G8929 Other chronic pain: Secondary | ICD-10-CM | POA: Insufficient documentation

## 2014-08-02 DIAGNOSIS — Z880 Allergy status to penicillin: Secondary | ICD-10-CM | POA: Diagnosis not present

## 2014-08-02 DIAGNOSIS — Z8669 Personal history of other diseases of the nervous system and sense organs: Secondary | ICD-10-CM | POA: Diagnosis not present

## 2014-08-02 DIAGNOSIS — Z7952 Long term (current) use of systemic steroids: Secondary | ICD-10-CM | POA: Diagnosis not present

## 2014-08-02 DIAGNOSIS — Z113 Encounter for screening for infections with a predominantly sexual mode of transmission: Secondary | ICD-10-CM | POA: Diagnosis not present

## 2014-08-02 DIAGNOSIS — F209 Schizophrenia, unspecified: Secondary | ICD-10-CM | POA: Insufficient documentation

## 2014-08-02 DIAGNOSIS — Z711 Person with feared health complaint in whom no diagnosis is made: Secondary | ICD-10-CM

## 2014-08-02 MED ORDER — CEFTRIAXONE SODIUM 250 MG IJ SOLR
250.0000 mg | Freq: Once | INTRAMUSCULAR | Status: AC
  Administered 2014-08-02: 250 mg via INTRAMUSCULAR
  Filled 2014-08-02: qty 250

## 2014-08-02 MED ORDER — STERILE WATER FOR INJECTION IJ SOLN
INTRAMUSCULAR | Status: AC
  Administered 2014-08-02: 10 mL
  Filled 2014-08-02: qty 10

## 2014-08-02 MED ORDER — AZITHROMYCIN 250 MG PO TABS
1000.0000 mg | ORAL_TABLET | Freq: Once | ORAL | Status: AC
  Administered 2014-08-02: 1000 mg via ORAL
  Filled 2014-08-02: qty 4

## 2014-08-02 NOTE — Discharge Instructions (Signed)
Sexually Transmitted Disease °A sexually transmitted disease (STD) is a disease or infection that may be passed (transmitted) from person to person, usually during sexual activity. This may happen by way of saliva, semen, blood, vaginal mucus, or urine. Common STDs include:  °· Gonorrhea.   °· Chlamydia.   °· Syphilis.   °· HIV and AIDS.   °· Genital herpes.   °· Hepatitis B and C.   °· Trichomonas.   °· Human papillomavirus (HPV).   °· Pubic lice.   °· Scabies. °· Mites. °· Bacterial vaginosis. °WHAT ARE CAUSES OF STDs? °An STD may be caused by bacteria, a virus, or parasites. STDs are often transmitted during sexual activity if one person is infected. However, they may also be transmitted through nonsexual means. STDs may be transmitted after:  °· Sexual intercourse with an infected person.   °· Sharing sex toys with an infected person.   °· Sharing needles with an infected person or using unclean piercing or tattoo needles. °· Having intimate contact with the genitals, mouth, or rectal areas of an infected person.   °· Exposure to infected fluids during birth. °WHAT ARE THE SIGNS AND SYMPTOMS OF STDs? °Different STDs have different symptoms. Some people may not have any symptoms. If symptoms are present, they may include:  °· Painful or bloody urination.   °· Pain in the pelvis, abdomen, vagina, anus, throat, or eyes.   °· A skin rash, itching, or irritation. °· Growths, ulcerations, blisters, or sores in the genital and anal areas. °· Abnormal vaginal discharge with or without bad odor.   °· Penile discharge in men.   °· Fever.   °· Pain or bleeding during sexual intercourse.   °· Swollen glands in the groin area.   °· Yellow skin and eyes (jaundice). This is seen with hepatitis.   °· Swollen testicles. °· Infertility. °· Sores and blisters in the mouth. °HOW ARE STDs DIAGNOSED? °To make a diagnosis, your health care provider may:  °· Take a medical history.   °· Perform a physical exam.   °· Take a sample of  any discharge to examine. °· Swab the throat, cervix, opening to the penis, rectum, or vagina for testing. °· Test a sample of your first morning urine.   °· Perform blood tests.   °· Perform a Pap test, if this applies.   °· Perform a colposcopy.   °· Perform a laparoscopy.   °HOW ARE STDs TREATED? ° Treatment depends on the STD. Some STDs may be treated but not cured.  °· Chlamydia, gonorrhea, trichomonas, and syphilis can be cured with antibiotic medicine.   °· Genital herpes, hepatitis, and HIV can be treated, but not cured, with prescribed medicines. The medicines lessen symptoms.   °· Genital warts from HPV can be treated with medicine or by freezing, burning (electrocautery), or surgery. Warts may come back.   °· HPV cannot be cured with medicine or surgery. However, abnormal areas may be removed from the cervix, vagina, or vulva.   °· If your diagnosis is confirmed, your recent sexual partners need treatment. This is true even if they are symptom-free or have a negative culture or evaluation. They should not have sex until their health care providers say it is okay. °HOW CAN I REDUCE MY RISK OF GETTING AN STD? °Take these steps to reduce your risk of getting an STD: °· Use latex condoms, dental dams, and water-soluble lubricants during sexual activity. Do not use petroleum jelly or oils. °· Avoid having multiple sex partners. °· Do not have sex with someone who has other sex partners. °· Do not have sex with anyone you do not know or who is at   high risk for an STD. °· Avoid risky sex practices that can break your skin. °· Do not have sex if you have open sores on your mouth or skin. °· Avoid drinking too much alcohol or taking illegal drugs. Alcohol and drugs can affect your judgment and put you in a vulnerable position. °· Avoid engaging in oral and anal sex acts. °· Get vaccinated for HPV and hepatitis. If you have not received these vaccines in the past, talk to your health care provider about whether one  or both might be right for you.   °· If you are at risk of being infected with HIV, it is recommended that you take a prescription medicine daily to prevent HIV infection. This is called pre-exposure prophylaxis (PrEP). You are considered at risk if: °¨ You are a man who has sex with other men (MSM). °¨ You are a heterosexual man or woman and are sexually active with more than one partner. °¨ You take drugs by injection. °¨ You are sexually active with a partner who has HIV. °· Talk with your health care provider about whether you are at high risk of being infected with HIV. If you choose to begin PrEP, you should first be tested for HIV. You should then be tested every 3 months for as long as you are taking PrEP.   °WHAT SHOULD I DO IF I THINK I HAVE AN STD? °· See your health care provider.   °· Tell your sexual partner(s). They should be tested and treated for any STDs. °· Do not have sex until your health care provider says it is okay.  °WHEN SHOULD I GET IMMEDIATE MEDICAL CARE? °Contact your health care provider right away if:  °· You have severe abdominal pain. °· You are a man and notice swelling or pain in your testicles. °· You are a woman and notice swelling or pain in your vagina. °Document Released: 03/17/2002 Document Revised: 12/30/2012 Document Reviewed: 07/15/2012 °ExitCare® Patient Information ©2015 [employer]. This information is not intended to replace advice given to you by your health care provider. Make sure you discuss any questions you have with your health care provider. ° °Chlamydia °Chlamydia is an infection. It is spread through sexual contact. Chlamydia can be in different areas of the body. These areas include the urethra, throat, or rectum. It is important to treat chlamydia as soon as possible. It can damage other organs.  °CAUSES  °Chlamydia is caused by bacteria. It is a sexually transmitted disease. This means that it is passed from an infected partner during intimate contact.  This contact could be with the genitals, mouth, or rectal area.  °SIGNS AND SYMPTOMS  °There may not be any symptoms. This is often the case early in the infection. If there are symptoms, they are usually mild and may only be noticeable in the morning. Symptoms you may notice include:  °· Burning with urination. °· Pain or swelling in the testicles. °· Watery mucus-like discharge from the penis. °· Long-standing (chronic) pelvic pain after frequent infections. °· Pain, swelling, or itching around the anus. °· A sore throat. °· Itching, burning, or redness in the eyes, or discharge from the eyes. °DIAGNOSIS  °To diagnose this infection, your health care provider will do a pelvic exam. A sample of urine or a swab from the rectum may be taken for testing.  °TREATMENT  °Chlamydia is treated with antibiotic medicines.  °HOME CARE INSTRUCTIONS °· Take your antibiotic medicine as directed by your health care provider. Finish   the antibiotic even if you start to feel better. Incomplete treatment will put you at risk for not being able to have children (sterility).   °· Take medicines only as directed by your health care provider.   °· Rest.   °· Inform any sexual partners about your infection. Even if they are symptom free or have a negative culture or evaluation, they should be treated for the condition.   °· Do not have sex (intercourse) until treatment is completed and your health care provider says it is okay.   °· Keep all follow-up visits as directed by your health care provider.   °· Not all test results are available during your visit. If your test results are not back during the visit, make an appointment with your health care provider to find out the results. Do not assume everything is normal if you have not heard from your health care provider or the medical facility. It is your responsibility to get your test results. °SEEK MEDICAL CARE IF: °· You develop new joint pain. °· You have a fever. °SEEK IMMEDIATE  MEDICAL CARE IF:  °· Your pain increases.   °· You have abnormal discharge.   °· You have pain during intercourse. °MAKE SURE YOU:  °· Understand these instructions. °· Will watch your condition. °· Will get help right away if you are not doing well or get worse. °Document Released: 12/25/2004 Document Revised: 05/11/2013 Document Reviewed: 07/03/2012 °ExitCare® Patient Information ©2015 [employer]. This information is not intended to replace advice given to you by your health care provider. Make sure you discuss any questions you have with your health care provider. ° °

## 2014-08-02 NOTE — ED Notes (Signed)
Per pt, states GF tested positive for STD and he wants to be checked, no symptoms

## 2014-08-02 NOTE — ED Provider Notes (Signed)
CSN: [id number]     Arrival date & time 08/02/14  1501 History  This chart was scribed for non-physician practitioner, Everlene Farrier, PA-C, working with Abelino Derrick, MD by Charline Bills, ED Scribe. This patient was seen in room WTR8/WTR8 and the patient's care was started at 4:20 PM.  Chief Complaint  Patient presents with  . std check    The history is provided by the patient. No language interpreter was used.   HPI Comments: Roger Woods is a 44 y.o. male who presents to the Emergency Department for STD screening. Pt's girlfriend tested positive for chlamydia and he requests STD screening at this time. He denies any symptoms. He denies fevers, penile discharge, penile pain, testicular pain, dysuria, hematuria, urinary frequency, urinary urgency, rash, abdominal pain, nausea, vomiting. No h/o STD.   Past Medical History  Diagnosis Date  . Schizophrenia   . Psychosis   . Insomnia due to medical condition   . Chronic pain   . Headache(784.0)   . Weakness     occasionally in both arms and hands;related to neck issues  . Hemorrhoids    Past Surgical History  Procedure Laterality Date  . Posterior cervical fusion/foraminotomy N/A 11/19/2012    Procedure: CERVICAL TWO TO CERVICAL SEVEN POSTERIOR CERVICAL FUSION/FORAMINOTOMY LEVEL 5;  Surgeon: Mariam Dollar, MD;  Location: [facility];  Service: Neurosurgery;  Laterality: N/A;  C2-7 posteior cervical arthrodesis with instrumentation  . Hardware removal N/A 06/24/2013    Procedure: EXPLORATION/HARDWARE REMOVAL CERVICAL FOUR-FIVE, CERVICAL FIVE-SIX, AND CERVICAL SIX-SEVEN.;  Surgeon: Mariam Dollar, MD;  Location: [facility];  Service: Neurosurgery;  Laterality: N/A;  posterior    Family History  Problem Relation Age of Onset  . High blood pressure Maternal Grandmother   . Cancer Other    History  Substance Use Topics  . Smoking status: Current Every Day Smoker -- 1.00 packs/day for 7 years    Types: Cigarettes  . Smokeless  tobacco: Never Used  . Alcohol Use: Yes     Comment: occ    Review of Systems  Constitutional: Negative for fever and chills.  Gastrointestinal: Negative for nausea, vomiting and abdominal pain.  Genitourinary: Negative for dysuria, urgency, frequency, hematuria, discharge, penile swelling, scrotal swelling, difficulty urinating, genital sores, penile pain and testicular pain.  Skin: Negative for rash.   Allergies  Penicillins  Home Medications   Prior to Admission medications   Medication Sig Start Date End Date Taking? Authorizing Provider  carisoprodol (SOMA) 350 MG tablet Take 350 mg by mouth 4 (four) times daily as needed for muscle spasms.    Historical Provider, MD  cyclobenzaprine (FLEXERIL) 10 MG tablet Take 1 tablet (10 mg total) by mouth 3 (three) times daily as needed for muscle spasms. Patient not taking: Reported on 02/05/2014 06/26/13   Donalee Citrin, MD  ibuprofen (ADVIL,MOTRIN) 200 MG tablet Take 400 mg by mouth every 6 (six) hours as needed (for pain.).     Historical Provider, MD  methylPREDNIsolone (MEDROL DOSPACK) 4 MG tablet follow package directions 06/26/13   Donalee Citrin, MD  oxyCODONE (ROXICODONE) 15 MG immediate release tablet Take 0.5 tablets (7.5 mg total) by mouth every 4 (four) hours as needed for moderate pain. 06/26/13   Donalee Citrin, MD  predniSONE (DELTASONE) 20 MG tablet Take 2 tablets (40 mg total) by mouth daily. 12/08/13   Earley Favor, NP  risperiDONE microspheres (RISPERDAL CONSTA) 25 MG injection Inject 25 mg into the muscle every 30 (thirty) days.  Historical Provider, MD   BP 138/72 mmHg  Pulse 80  Temp(Src) 98.3 F (36.8 C) (Oral)  Resp 18  SpO2 98% Physical Exam  Constitutional: He appears well-developed and well-nourished. No distress.  Nontoxic appearing.  HENT:  Head: Normocephalic and atraumatic.  Eyes: Conjunctivae are normal. Pupils are equal, round, and reactive to light. Right eye exhibits no discharge. Left eye exhibits no discharge.   Neck: Neck supple.  Cardiovascular: Normal rate, regular rhythm and intact distal pulses.   Pulmonary/Chest: Effort normal and breath sounds normal. No respiratory distress.  Abdominal: Soft. He exhibits no distension. There is no tenderness. There is no guarding.  Abdomen is soft and nontender to palpation.  Genitourinary: Testes normal and penis normal. Right testis shows no tenderness. Left testis shows no tenderness. No penile tenderness. No discharge found.  GU exam performed by me with male scribe chaperone. Scrotal contents is normal. No rashes or lesions noted. No penile or scrotal tenderness. No penile discharge.  Lymphadenopathy:    He has no cervical adenopathy.  Neurological: He is alert. Coordination normal.  Skin: Skin is warm and dry. No rash noted. He is not diaphoretic. No erythema. No pallor.  Psychiatric: He has a normal mood and affect. His behavior is normal.  Nursing note and vitals reviewed.  ED Course  Procedures (including critical care time) DIAGNOSTIC STUDIES: Oxygen Saturation is 98% on RA, normal by my interpretation.    COORDINATION OF CARE: 4:24 PM-Discussed treatment plan which includes STD screening with pt at bedside and pt agreed to plan.   Labs Review Labs Reviewed  HIV ANTIBODY (ROUTINE TESTING)  RPR  GC/CHLAMYDIA PROBE AMP ([place]) NOT AT [facility]    Imaging Review No results found.   EKG Interpretation None      Filed Vitals:   08/02/14 1517  BP: 138/72  Pulse: 80  Temp: 98.3 F (36.8 C)  TempSrc: Oral  Resp: 18  SpO2: 98%     MDM   Meds given in ED:  Medications  cefTRIAXone (ROCEPHIN) injection 250 mg (not administered)  azithromycin (ZITHROMAX) tablet 1,000 mg (not administered)    New Prescriptions   No medications on file    Final diagnoses:  Concern about STD in male without diagnosis   This is a 44 y.o. male who presents to the Emergency Department for STD screening. Pt's girlfriend tested positive for  chlamydia and he requests STD screening at this time. He denies any symptoms such as fevers, penile discharge, penile pain, testicular pain, dysuria, or rashes.  Will check for HIV, syphilis, gonorrhea and chlamydia. Patient treated with Rocephin and azithromycin in the ED. Education provided on STDs. Advised patient that these tests do not come back immediately and he should follow-up with this testing in 3-4 days. I advised the patient to follow-up with their primary care provider this week. I advised the patient to return to the emergency department with new or worsening symptoms or new concerns. The patient verbalized understanding and agreement with plan.    I personally performed the services described in this documentation, which was scribed in my presence. The recorded information has been reviewed and is accurate.     Everlene Farrier, PA-C 08/02/14 1631  Courteney Randall An, MD 08/02/14 2238

## 2014-08-03 LAB — HIV ANTIBODY (ROUTINE TESTING W REFLEX): HIV SCREEN 4TH GENERATION: NONREACTIVE

## 2014-08-03 LAB — GC/CHLAMYDIA PROBE AMP (~~LOC~~) NOT AT ARMC
Chlamydia: NEGATIVE
NEISSERIA GONORRHEA: NEGATIVE

## 2014-08-03 LAB — RPR: RPR Ser Ql: NONREACTIVE

## 2015-01-07 ENCOUNTER — Ambulatory Visit (HOSPITAL_BASED_OUTPATIENT_CLINIC_OR_DEPARTMENT_OTHER): Payer: Medicaid Other

## 2015-02-06 ENCOUNTER — Encounter (HOSPITAL_COMMUNITY): Payer: Self-pay | Admitting: Family Medicine

## 2015-02-06 ENCOUNTER — Emergency Department (HOSPITAL_COMMUNITY)
Admission: EM | Admit: 2015-02-06 | Discharge: 2015-02-06 | Disposition: A | Discharge status: Home / Self Care | Payer: Medicaid Other | Attending: Emergency Medicine | Admitting: Emergency Medicine

## 2015-02-06 ENCOUNTER — Emergency Department (HOSPITAL_COMMUNITY): Payer: Medicaid Other

## 2015-02-06 DIAGNOSIS — Y9289 Other specified places as the place of occurrence of the external cause: Secondary | ICD-10-CM | POA: Diagnosis not present

## 2015-02-06 DIAGNOSIS — F209 Schizophrenia, unspecified: Secondary | ICD-10-CM | POA: Diagnosis not present

## 2015-02-06 DIAGNOSIS — Z7952 Long term (current) use of systemic steroids: Secondary | ICD-10-CM | POA: Insufficient documentation

## 2015-02-06 DIAGNOSIS — W208XXA Other cause of strike by thrown, projected or falling object, initial encounter: Secondary | ICD-10-CM | POA: Diagnosis not present

## 2015-02-06 DIAGNOSIS — S60222A Contusion of left hand, initial encounter: Secondary | ICD-10-CM

## 2015-02-06 DIAGNOSIS — Z880 Allergy status to penicillin: Secondary | ICD-10-CM | POA: Diagnosis not present

## 2015-02-06 DIAGNOSIS — Z8719 Personal history of other diseases of the digestive system: Secondary | ICD-10-CM | POA: Diagnosis not present

## 2015-02-06 DIAGNOSIS — G8929 Other chronic pain: Secondary | ICD-10-CM | POA: Insufficient documentation

## 2015-02-06 DIAGNOSIS — F17210 Nicotine dependence, cigarettes, uncomplicated: Secondary | ICD-10-CM | POA: Insufficient documentation

## 2015-02-06 DIAGNOSIS — S6992XA Unspecified injury of left wrist, hand and finger(s), initial encounter: Secondary | ICD-10-CM | POA: Diagnosis present

## 2015-02-06 DIAGNOSIS — Y998 Other external cause status: Secondary | ICD-10-CM | POA: Insufficient documentation

## 2015-02-06 DIAGNOSIS — Y9389 Activity, other specified: Secondary | ICD-10-CM | POA: Insufficient documentation

## 2015-02-06 MED ORDER — IBUPROFEN 600 MG PO TABS: 600.0000 mg | ORAL_TABLET | Freq: Four times a day (QID) | ORAL | Status: DC | PRN

## 2015-02-06 MED ORDER — OXYCODONE-ACETAMINOPHEN 5-325 MG PO TABS
1.0000 | ORAL_TABLET | Freq: Once | ORAL | Status: AC
  Administered 2015-02-06: 1 via ORAL
  Filled 2015-02-06: qty 1

## 2015-02-06 MED ORDER — IBUPROFEN 200 MG PO TABS
600.0000 mg | ORAL_TABLET | Freq: Once | ORAL | Status: AC
  Administered 2015-02-06: 600 mg via ORAL
  Filled 2015-02-06: qty 3

## 2015-02-06 MED ORDER — OXYCODONE-ACETAMINOPHEN 5-325 MG PO TABS: 1.0000 | ORAL_TABLET | Freq: Four times a day (QID) | ORAL | Status: DC | PRN

## 2015-02-06 NOTE — ED Notes (Signed)
Patient transported to X-ray 

## 2015-02-06 NOTE — Discharge Instructions (Signed)
Contusion A contusion is a deep bruise. Contusions happen when an injury causes bleeding under the skin. Symptoms of bruising include pain, swelling, and discolored skin. The skin may turn blue, purple, or yellow. HOME CARE   Rest the injured area.  If told, put ice on the injured area.  Put ice in a plastic bag.  Place a towel between your skin and the bag.  Leave the ice on for 20 minutes, 2-3 times per day.  If told, put light pressure (compression) on the injured area using an elastic bandage. Make sure the bandage is not too tight. Remove it and put it back on as told by your doctor.  If possible, raise (elevate) the injured area above the level of your heart while you are sitting or lying down.  Take over-the-counter and prescription medicines only as told by your doctor. GET HELP IF:  Your symptoms do not get better after several days of treatment.  Your symptoms get worse.  You have trouble moving the injured area. GET HELP RIGHT AWAY IF:   You have very bad pain.  You have a loss of feeling (numbness) in a hand or foot.  Your hand or foot turns pale or cold.   This information is not intended to replace advice given to you by your health care provider. Make sure you discuss any questions you have with your health care provider.   Document Released: 06/13/2007 Document Revised: 09/15/2014 Document Reviewed: 05/12/2014 Elsevier Interactive Patient Education 2016 [employer]  Hand Contusion  A hand contusion is a deep bruise to the hand. Contusions happen when an injury causes bleeding under the skin. Signs of bruising include pain, puffiness (swelling), and discolored skin. The contusion may turn blue, purple, or yellow. HOME CARE  Put ice on the injured area.  Put ice in a plastic bag.  Place a towel between your skin and the bag.  Leave the ice on for 15-20 minutes, 03-04 times a day.  Only take medicines as told by your doctor.  Use an elastic wrap  only as told. You may remove the wrap for sleeping, showering, and bathing. Take the wrap off if you lose feeling (have numbness) in your fingers, or they turn blue or cold. Put the wrap on more loosely.  Keep the hand raised (elevated) with pillows.  Avoid using your hand too much if it is painful. GET HELP RIGHT AWAY IF:   You have more redness, puffiness, or pain in your hand.  Your puffiness or pain does not get better with medicine.  You lose feeling in your hand, or you cannot move your fingers.  Your hand turns cold or blue.  You have pain when you move your fingers.  Your hand feels warm.  Your contusion does not get better in 2 days. MAKE SURE YOU:   Understand these instructions.  Will watch this condition.  Will get help right away if you are not doing well or you get worse.   This information is not intended to replace advice given to you by your health care provider. Make sure you discuss any questions you have with your health care provider.   Document Released: 06/13/2007 Document Revised: 01/15/2014 Document Reviewed: 06/18/2011 Elsevier Interactive Patient Education 2016 [employer].  Cryotherapy Cryotherapy is when you put ice on your injury. Ice helps lessen pain and puffiness (swelling) after an injury. Ice works the best when you start using it in the first 24 to 48 hours after an  injury. HOME CARE  Put a dry or damp towel between the ice pack and your skin.  You may press gently on the ice pack.  Leave the ice on for no more than 10 to 20 minutes at a time.  Check your skin after 5 minutes to make sure your skin is okay.  Rest at least 20 minutes between ice pack uses.  Stop using ice when your skin loses feeling (numbness).  Do not use ice on someone who cannot tell you when it hurts. This includes small children and people with memory problems (dementia). GET HELP RIGHT AWAY IF:  You have white spots on your skin.  Your skin turns blue  or pale.  Your skin feels waxy or hard.  Your puffiness gets worse. MAKE SURE YOU:   Understand these instructions.  Will watch your condition.  Will get help right away if you are not doing well or get worse.   This information is not intended to replace advice given to you by your health care provider. Make sure you discuss any questions you have with your health care provider.   Document Released: 06/13/2007 Document Revised: 03/19/2011 Document Reviewed: 08/17/2010 Elsevier Interactive Patient Education [employer]
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
## 2015-03-04 ENCOUNTER — Ambulatory Visit (HOSPITAL_BASED_OUTPATIENT_CLINIC_OR_DEPARTMENT_OTHER): Payer: Medicaid Other

## 2015-03-08 ENCOUNTER — Emergency Department (HOSPITAL_COMMUNITY): Payer: Medicaid Other

## 2015-03-08 ENCOUNTER — Emergency Department (HOSPITAL_COMMUNITY)
Admission: EM | Admit: 2015-03-08 | Discharge: 2015-03-08 | Disposition: A | Discharge status: Home / Self Care | Payer: Medicaid Other | Attending: Emergency Medicine | Admitting: Emergency Medicine

## 2015-03-08 ENCOUNTER — Encounter (HOSPITAL_COMMUNITY): Payer: Self-pay

## 2015-03-08 DIAGNOSIS — IMO0002 Reserved for concepts with insufficient information to code with codable children: Secondary | ICD-10-CM

## 2015-03-08 DIAGNOSIS — Z23 Encounter for immunization: Secondary | ICD-10-CM | POA: Insufficient documentation

## 2015-03-08 DIAGNOSIS — F17210 Nicotine dependence, cigarettes, uncomplicated: Secondary | ICD-10-CM | POA: Diagnosis not present

## 2015-03-08 DIAGNOSIS — Z79899 Other long term (current) drug therapy: Secondary | ICD-10-CM | POA: Insufficient documentation

## 2015-03-08 DIAGNOSIS — G8929 Other chronic pain: Secondary | ICD-10-CM | POA: Diagnosis not present

## 2015-03-08 DIAGNOSIS — Z8659 Personal history of other mental and behavioral disorders: Secondary | ICD-10-CM | POA: Insufficient documentation

## 2015-03-08 DIAGNOSIS — Y9389 Activity, other specified: Secondary | ICD-10-CM | POA: Diagnosis not present

## 2015-03-08 DIAGNOSIS — Y998 Other external cause status: Secondary | ICD-10-CM | POA: Diagnosis not present

## 2015-03-08 DIAGNOSIS — S29001A Unspecified injury of muscle and tendon of front wall of thorax, initial encounter: Secondary | ICD-10-CM | POA: Diagnosis present

## 2015-03-08 DIAGNOSIS — S21119A Laceration without foreign body of unspecified front wall of thorax without penetration into thoracic cavity, initial encounter: Secondary | ICD-10-CM | POA: Insufficient documentation

## 2015-03-08 DIAGNOSIS — Z8719 Personal history of other diseases of the digestive system: Secondary | ICD-10-CM | POA: Diagnosis not present

## 2015-03-08 DIAGNOSIS — Z880 Allergy status to penicillin: Secondary | ICD-10-CM | POA: Insufficient documentation

## 2015-03-08 DIAGNOSIS — Y9289 Other specified places as the place of occurrence of the external cause: Secondary | ICD-10-CM | POA: Diagnosis not present

## 2015-03-08 DIAGNOSIS — W268XXA Contact with other sharp object(s), not elsewhere classified, initial encounter: Secondary | ICD-10-CM | POA: Diagnosis not present

## 2015-03-08 DIAGNOSIS — S299XXA Unspecified injury of thorax, initial encounter: Secondary | ICD-10-CM

## 2015-03-08 MED ORDER — OXYCODONE-ACETAMINOPHEN 5-325 MG PO TABS
2.0000 | ORAL_TABLET | Freq: Once | ORAL | Status: AC
  Administered 2015-03-08: 2 via ORAL
  Filled 2015-03-08: qty 2

## 2015-03-08 MED ORDER — TETANUS-DIPHTH-ACELL PERTUSSIS 5-2.5-18.5 LF-MCG/0.5 IM SUSP
0.5000 mL | Freq: Once | INTRAMUSCULAR | Status: AC
  Administered 2015-03-08: 0.5 mL via INTRAMUSCULAR
  Filled 2015-03-08: qty 0.5

## 2015-03-08 MED ORDER — IBUPROFEN 800 MG PO TABS: 800.0000 mg | ORAL_TABLET | Freq: Three times a day (TID) | ORAL | Status: DC

## 2015-03-08 NOTE — Discharge Instructions (Signed)
Rib Contusion A rib contusion is a deep bruise on your rib area. Contusions are the result of a blunt trauma that causes bleeding and injury to the tissues under the skin. A rib contusion may involve bruising of the ribs and of the skin and muscles in the area. The skin overlying the contusion may turn blue, purple, or yellow. Minor injuries will give you a painless contusion, but more severe contusions may stay painful and swollen for a few weeks. CAUSES  A contusion is usually caused by a blow, trauma, or direct force to an area of the body. This often occurs while playing contact sports. SYMPTOMS  Swelling and redness of the injured area.  Discoloration of the injured area.  Tenderness and soreness of the injured area.  Pain with or without movement. DIAGNOSIS  The diagnosis can be made by taking a medical history and performing a physical exam. An X-ray, CT scan, or MRI may be needed to determine if there were any associated injuries, such as broken bones (fractures) or internal injuries. TREATMENT  Often, the best treatment for a rib contusion is rest. Icing or applying cold compresses to the injured area may help reduce swelling and inflammation. Deep breathing exercises may be recommended to reduce the risk of partial lung collapse and pneumonia. Over-the-counter or prescription medicines may also be recommended for pain control. HOME CARE INSTRUCTIONS   Apply ice to the injured area:  Put ice in a plastic bag.  Place a towel between your skin and the bag.  Leave the ice on for 20 minutes, 2-3 times per day.  Take medicines only as directed by your health care provider.  Rest the injured area. Avoid strenuous activity and any activities or movements that cause pain. Be careful during activities and avoid bumping the injured area.  Perform deep-breathing exercises as directed by your health care provider.  Do not lift anything that is heavier than 5 lb (2.3 kg) until your  health care provider approves.  Do not use any tobacco products, including cigarettes, chewing tobacco, or electronic cigarettes. If you need help quitting, ask your health care provider. SEEK MEDICAL CARE IF:   You have increased bruising or swelling.  You have pain that is not controlled with treatment.  You have a fever. SEEK IMMEDIATE MEDICAL CARE IF:   You have difficulty breathing or shortness of breath.  You develop a continual cough, or you cough up thick or bloody sputum.  You feel sick to your stomach (nauseous), you throw up (vomit), or you have abdominal pain.   This information is not intended to replace advice given to you by your health care provider. Make sure you discuss any questions you have with your health care provider.   Document Released: 09/19/2000 Document Revised: 01/15/2014 Document Reviewed: 10/06/2013 Elsevier Interactive Patient Education 2016 [employer] Tissue Adhesive Wound Care Some cuts, wounds, lacerations, and incisions can be repaired by using tissue adhesive. Tissue adhesive is like glue. It holds the skin together, allowing for faster healing. It forms a strong bond on the skin in about 1 minute and reaches its full strength in about 2 or 3 minutes. The adhesive disappears naturally while the wound is healing. It is important to take proper care of your wound at home while it heals.  HOME CARE INSTRUCTIONS   Showers are allowed. Do not soak the area containing the tissue adhesive. Do not take baths, swim, or use hot tubs. Do not use any soaps or ointments  on the wound. Certain ointments can weaken the glue.  If a bandage (dressing) has been applied, follow your health care provider's instructions for how often to change the dressing.   Keep the dressing dry if one has been applied.   Do not scratch, pick, or rub the adhesive.   Do not place tape over the adhesive. The adhesive could come off when pulling the tape off.   Protect the  wound from further injury until it is healed.   Protect the wound from sun and tanning bed exposure while it is healing and for several weeks after healing.   Only take over-the-counter or prescription medicines as directed by your health care provider.   Keep all follow-up appointments as directed by your health care provider. SEEK IMMEDIATE MEDICAL CARE IF:   Your wound becomes red, swollen, hot, or tender.   You develop a rash after the glue is applied.  You have increasing pain in the wound.   You have a red streak that goes away from the wound.   You have pus coming from the wound.   You have increased bleeding.  You have a fever.  You have shaking chills.   You notice a bad smell coming from the wound.   Your wound or adhesive breaks open.  MAKE SURE YOU:   Understand these instructions.  Will watch your condition.  Will get help right away if you are not doing well or get worse.   This information is not intended to replace advice given to you by your health care provider. Make sure you discuss any questions you have with your health care provider.   Document Released: 06/20/2000 Document Revised: 10/15/2012 Document Reviewed: 07/16/2012 Elsevier Interactive Patient Education [employer]
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
## 2015-03-08 NOTE — ED Notes (Signed)
Patient was in the dark working under a car. Patient states the metal was rusty. Patient states he doesn't know when his last tetanus shot was. Patient does not know how long the piece of metal was. Patient states his left arm is hurting also.

## 2015-07-22 ENCOUNTER — Emergency Department (HOSPITAL_COMMUNITY)
Admission: EM | Admit: 2015-07-22 | Discharge: 2015-07-23 | Discharge status: Against Medical Advice | Payer: Medicaid Other | Attending: Emergency Medicine | Admitting: Emergency Medicine

## 2015-07-22 ENCOUNTER — Ambulatory Visit (HOSPITAL_COMMUNITY)
Admission: EM | Admit: 2015-07-22 | Discharge: 2015-07-22 | Disposition: A | Discharge status: Nursing Home (Includes ICF) | Payer: Medicaid Other | Attending: Internal Medicine | Admitting: Internal Medicine

## 2015-07-22 ENCOUNTER — Encounter (HOSPITAL_COMMUNITY): Payer: Self-pay | Admitting: Emergency Medicine

## 2015-07-22 ENCOUNTER — Encounter (HOSPITAL_COMMUNITY): Payer: Self-pay | Admitting: *Deleted

## 2015-07-22 DIAGNOSIS — G8929 Other chronic pain: Secondary | ICD-10-CM | POA: Diagnosis not present

## 2015-07-22 DIAGNOSIS — F17210 Nicotine dependence, cigarettes, uncomplicated: Secondary | ICD-10-CM | POA: Insufficient documentation

## 2015-07-22 DIAGNOSIS — Z79899 Other long term (current) drug therapy: Secondary | ICD-10-CM | POA: Insufficient documentation

## 2015-07-22 DIAGNOSIS — M542 Cervicalgia: Secondary | ICD-10-CM | POA: Diagnosis not present

## 2015-07-22 DIAGNOSIS — Y92410 Unspecified street and highway as the place of occurrence of the external cause: Secondary | ICD-10-CM | POA: Diagnosis not present

## 2015-07-22 DIAGNOSIS — Y999 Unspecified external cause status: Secondary | ICD-10-CM | POA: Insufficient documentation

## 2015-07-22 DIAGNOSIS — Y939 Activity, unspecified: Secondary | ICD-10-CM | POA: Diagnosis not present
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
## 2015-07-23 ENCOUNTER — Emergency Department (HOSPITAL_COMMUNITY): Payer: Medicaid Other
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
## 2015-07-23 NOTE — ED Notes (Signed)
Patient just returned to room, being wheeled by family member.
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
## 2015-11-14 ENCOUNTER — Encounter (HOSPITAL_COMMUNITY): Payer: Self-pay

## 2015-11-14 ENCOUNTER — Emergency Department (HOSPITAL_COMMUNITY)
Admission: EM | Admit: 2015-11-14 | Discharge: 2015-11-14 | Disposition: A | Discharge status: Home / Self Care | Payer: Medicaid Other | Attending: Emergency Medicine | Admitting: Emergency Medicine

## 2015-11-14 DIAGNOSIS — M542 Cervicalgia: Secondary | ICD-10-CM | POA: Diagnosis present

## 2015-11-14 DIAGNOSIS — F17210 Nicotine dependence, cigarettes, uncomplicated: Secondary | ICD-10-CM | POA: Diagnosis not present

## 2015-11-14 MED ORDER — NAPROXEN 500 MG PO TABS: 500.0000 mg | ORAL_TABLET | Freq: Two times a day (BID) | ORAL | 0 refills | Status: DC

## 2015-11-14 MED ORDER — OXYCODONE-ACETAMINOPHEN 5-325 MG PO TABS
2.0000 | ORAL_TABLET | Freq: Once | ORAL | Status: AC
  Administered 2015-11-14: 2 via ORAL
  Filled 2015-11-14: qty 2

## 2015-11-14 MED ORDER — CYCLOBENZAPRINE HCL 10 MG PO TABS: 10.0000 mg | ORAL_TABLET | Freq: Three times a day (TID) | ORAL | 0 refills | Status: DC | PRN

## 2015-11-14 MED ORDER — OXYCODONE HCL 15 MG PO TABS: 7.5000 mg | ORAL_TABLET | Freq: Three times a day (TID) | ORAL | 0 refills | Status: DC | PRN
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
## 2015-11-14 NOTE — ED Notes (Signed)
PT DISCHARGED. INSTRUCTIONS AND PRESCRIPTIONS GIVEN. AAOX4. PT IN NO APPARENT DISTRESS. THE OPPORTUNITY TO ASK QUESTIONS WAS PROVIDED. 
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
## 2015-11-15 NOTE — Progress Notes (Signed)
Patient listed as having Medicaid insurance without a pcp.  [facility]  [facility].  [facility].  No further EDCM needs at this time.

## 2015-11-18 ENCOUNTER — Telehealth: Payer: Self-pay

## 2015-11-18 NOTE — Telephone Encounter (Signed)
This Case Manager received communication from Radford PaxAmy Ferrero, RN [facility] that patient needing ED follow-up appointment for neck pain. Patient does not have a PCP. Spoke with patient, and patient confirmed need for ED follow-up appointment. Appointment scheduled for 11/22/15 at 1130 with Scot Juniffany Noel, PA. Patient given clinic's contact information. Patient appreciative of appointment.

## 2015-11-22 ENCOUNTER — Inpatient Hospital Stay: Payer: Medicaid Other

## 2017-06-20 ENCOUNTER — Encounter

## 2018-05-04 ENCOUNTER — Emergency Department (HOSPITAL_COMMUNITY)
Admission: EM | Admit: 2018-05-04 | Discharge: 2018-05-04 | Disposition: A | Discharge status: Home / Self Care | Payer: Medicaid Other | Attending: Emergency Medicine | Admitting: Emergency Medicine

## 2018-05-04 ENCOUNTER — Encounter (HOSPITAL_COMMUNITY): Payer: Self-pay | Admitting: *Deleted

## 2018-05-04 ENCOUNTER — Other Ambulatory Visit: Payer: Self-pay

## 2018-05-04 DIAGNOSIS — L0291 Cutaneous abscess, unspecified: Secondary | ICD-10-CM

## 2018-05-04 DIAGNOSIS — Z79899 Other long term (current) drug therapy: Secondary | ICD-10-CM | POA: Insufficient documentation

## 2018-05-04 DIAGNOSIS — L0231 Cutaneous abscess of buttock: Secondary | ICD-10-CM | POA: Diagnosis not present

## 2018-05-04 DIAGNOSIS — F17210 Nicotine dependence, cigarettes, uncomplicated: Secondary | ICD-10-CM | POA: Insufficient documentation

## 2018-05-04 MED ORDER — HYDROCODONE-ACETAMINOPHEN 5-325 MG PO TABS: 1.0000 | ORAL_TABLET | ORAL | 0 refills | Status: DC | PRN

## 2018-05-04 MED ORDER — SULFAMETHOXAZOLE-TRIMETHOPRIM 800-160 MG PO TABS: 1.0000 | ORAL_TABLET | Freq: Two times a day (BID) | ORAL | 0 refills | Status: DC

## 2018-05-04 MED ORDER — LIDOCAINE-EPINEPHRINE (PF) 2 %-1:200000 IJ SOLN
30.0000 mL | Freq: Once | INTRAMUSCULAR | Status: AC
  Administered 2018-05-04: 30 mL via INTRADERMAL

## 2018-05-04 NOTE — ED Provider Notes (Signed)
[facility]-EMERGENCY DEPT Provider Note   CSN: [id number] Arrival date & time: 05/04/18  0544    History   Chief Complaint Chief Complaint  Patient presents with  . Abscess    HPI Roger Woods is a 48 y.o. male.     Patient presents with complaints of a painful abscess on his left buttock.  Patient reports that symptoms have been present for nearly 2 weeks.  The area has progressively worsened, no drainage.  He has not had any fever.     Past Medical History:  Diagnosis Date  . Chronic pain   . Headache(784.0)   . Hemorrhoids   . Insomnia due to medical condition   . Psychosis (HCC)   . Schizophrenia (HCC)   . Weakness    occasionally in both arms and hands;related to neck issues    Patient Active Problem List   Diagnosis Date Noted  . Pseudoarthrosis of cervical spine (HCC) 06/24/2013  . Hypersomnia with sleep apnea, unspecified 04/24/2013  . Narcotic drug use 04/24/2013  . 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 HARDWARE REMOVAL N/A 06/24/2013   Procedure: EXPLORATION/HARDWARE REMOVAL CERVICAL FOUR-FIVE, CERVICAL FIVE-SIX, AND CERVICAL SIX-SEVEN.;  Surgeon: Mariam Dollar, MD;  Location: [facility];  Service: Neurosurgery;  Laterality: N/A;  posterior   . POSTERIOR CERVICAL FUSION/FORAMINOTOMY N/A 11/19/2012   Procedure: CERVICAL TWO TO CERVICAL SEVEN POSTERIOR CERVICAL FUSION/FORAMINOTOMY LEVEL 5;  Surgeon: Mariam Dollar, MD;  Location: [facility];  Service: Neurosurgery;  Laterality: N/A;  C2-7 posteior cervical arthrodesis with instrumentation        Home Medications    Prior to Admission medications   Medication Sig Start Date End Date Taking? Authorizing Provider  ALPRAZolam Prudy Feeler) 0.25 MG tablet take 1 tablet by mouth three times a day if needed for anxiety 01/26/15   [provider]  cyclobenzaprine (FLEXERIL) 10 MG tablet Take 1 tablet (10 mg total)  by mouth 3 (three) times daily as needed for muscle spasms. 11/14/15   Felicie Morn, NP  hydrochlorothiazide (MICROZIDE) 12.5 MG capsule Take 12.5 mg by mouth daily. 01/04/15   [provider]  HYDROcodone-acetaminophen (NORCO/VICODIN) 5-325 MG tablet Take 1 tablet by mouth every 4 (four) hours as needed for moderate pain. 05/04/18   Gilda Crease, MD  ibuprofen (ADVIL,MOTRIN) 800 MG tablet Take 1 tablet (800 mg total) by mouth 3 (three) times daily. 03/08/15   Roxy Horseman, PA-C  INVEGA SUSTENNA 234 MG/1.5ML SUSP injection INJECT Intramuscularly ONCE EVERY MONTH 02/25/15   [provider]  methylPREDNIsolone (MEDROL DOSPACK) 4 MG tablet follow package directions Patient not taking: Reported on 03/08/2015 06/26/13   Donalee Citrin, MD  naproxen (NAPROSYN) 500 MG tablet Take 1 tablet (500 mg total) by mouth 2 (two) times daily. 11/14/15   Felicie Morn, NP  oxyCODONE (ROXICODONE) 15 MG immediate release tablet Take 0.5 tablets (7.5 mg total) by mouth every 8 (eight) hours as needed. 11/14/15   Felicie Morn, NP  oxyCODONE-acetaminophen (PERCOCET/ROXICET) 5-325 MG tablet Take 1 tablet by mouth every 6 (six) hours as needed for severe pain. Patient not taking: Reported on 03/08/2015 02/06/15   Earley Favor, NP  predniSONE (DELTASONE) 20 MG tablet Take 2 tablets (40 mg total) by mouth daily. Patient not taking: Reported on 03/08/2015 12/08/13   Earley Favor, NP  sulfamethoxazole-trimethoprim (BACTRIM DS) 800-160 MG tablet Take 1 tablet by mouth 2 (two) times daily. 05/04/18  Gilda CreasePollina,  J, MD  Vitamin D, Ergocalciferol, (DRISDOL) 50000 units CAPS capsule Take 50,000 Units by mouth once a week. 01/04/15   [provider]    Family History Family History  Problem Relation Age of Onset  . High blood pressure Maternal Grandmother   . Cancer Other     Social History Social History   Tobacco Use  . Smoking status: Current Every Day Smoker    Packs/day: 1.00     Years: 7.00    Pack years: 7.00    Types: Cigarettes  . Smokeless tobacco: Never Used  Substance Use Topics  . Alcohol use: No  . Drug use: No     Allergies   Penicillins   Review of Systems Review of Systems  Constitutional: Negative for fever.  Skin:       abscess     Physical Exam Updated Vital Signs BP (!) 121/59 (BP Location: Left Arm)   Pulse 86   Temp 99.7 F (37.6 C) (Oral)   Resp 18   Ht 5\' 9"  (1.753 m)   Wt 113.4 kg   SpO2 97%   BMI 36.92 kg/m   Physical Exam Vitals signs and nursing note reviewed. Exam conducted with a chaperone present.  HENT:     Head: Normocephalic.  Cardiovascular:     Rate and Rhythm: Normal rate.  Pulmonary:     Effort: Pulmonary effort is normal.     Breath sounds: Normal breath sounds.  Musculoskeletal: Normal range of motion.  Skin:    Comments: Tender, swollen, indurated and fluctuant area left upper buttock, approximately 8 cm in diameter  Neurological:     Mental Status: He is alert.      ED Treatments / Results  Labs (all labs ordered are listed, but only abnormal results are displayed) Labs Reviewed - No data to display  EKG None  Radiology No results found.  Procedures .Marland Kitchen.Incision and Drainage Date/Time: 05/04/2018 6:38 AM Performed by: Gilda CreasePollina,  J, MD Authorized by: Gilda CreasePollina,  J, MD   Consent:    Consent obtained:  Verbal   Consent given by:  Patient   Risks discussed:  Incomplete drainage and pain Universal protocol:    Procedure explained and questions answered to patient or proxy's satisfaction: yes     Site/side marked: yes     Immediately prior to procedure a time out was called: yes     Patient identity confirmed:  Verbally with patient Location:    Type:  Abscess   Size:  8   Location:  Anogenital   Anogenital location:  Gluteal cleft Pre-procedure details:    Skin preparation:  Betadine Anesthesia (see MAR for exact dosages):    Anesthesia method:  Local  infiltration   Local anesthetic:  Lidocaine 2% WITH epi Procedure type:    Complexity:  Simple Procedure details:    Incision types:  Single straight   Incision depth:  Subcutaneous   Scalpel blade:  11   Wound management:  Probed and deloculated and irrigated with saline   Drainage:  Purulent   Drainage amount:  Copious   Wound treatment:  Wound left open   Packing materials:  None Post-procedure details:    Patient tolerance of procedure:  Tolerated well, no immediate complications   (including critical care time)  Medications Ordered in ED Medications  lidocaine-EPINEPHrine (XYLOCAINE W/EPI) 2 %-1:200000 (PF) injection 30 mL (has no administration in time range)     Initial Impression / Assessment and Plan / ED  Course  I have reviewed the triage vital signs and the nursing notes.  Pertinent labs & imaging results that were available during my care of the patient were reviewed by me and considered in my medical decision making (see chart for details).        Patient presents with abscess of the left upper gluteal cleft.  This was a very large abscess, it was drained and a significant amount of purulent drainage evacuated.  Wound left open.  Patient counseled that he should do warm soaks 2 times a day for the next couple of days.  Will empirically place on antibiotics.  Return for worsening symptoms.  Final Clinical Impressions(s) / ED Diagnoses   Final diagnoses:  Abscess    ED Discharge Orders         Ordered    HYDROcodone-acetaminophen (NORCO/VICODIN) 5-325 MG tablet  Every 4 hours PRN     05/04/18 0645    sulfamethoxazole-trimethoprim (BACTRIM DS) 800-160 MG tablet  2 times daily     05/04/18 0645           Gilda Crease, MD 05/04/18 [id number]

## 2018-05-04 NOTE — ED Triage Notes (Signed)
Pt c/o abscess to buttocks x 2 weeks.

## 2020-01-03 ENCOUNTER — Encounter (HOSPITAL_COMMUNITY): Payer: Self-pay

## 2020-01-03 ENCOUNTER — Other Ambulatory Visit: Payer: Self-pay

## 2020-01-03 ENCOUNTER — Emergency Department (HOSPITAL_COMMUNITY)
Admission: EM | Admit: 2020-01-03 | Discharge: 2020-01-03 | Disposition: A | Discharge status: Home / Self Care | Payer: Medicaid Other | Attending: Emergency Medicine | Admitting: Emergency Medicine

## 2020-01-03 ENCOUNTER — Emergency Department (HOSPITAL_COMMUNITY): Payer: Medicaid Other

## 2020-01-03 DIAGNOSIS — F17210 Nicotine dependence, cigarettes, uncomplicated: Secondary | ICD-10-CM | POA: Insufficient documentation

## 2020-01-03 DIAGNOSIS — S299XXA Unspecified injury of thorax, initial encounter: Secondary | ICD-10-CM | POA: Diagnosis not present

## 2020-01-03 DIAGNOSIS — S4992XA Unspecified injury of left shoulder and upper arm, initial encounter: Secondary | ICD-10-CM | POA: Diagnosis not present

## 2020-01-03 DIAGNOSIS — Y92410 Unspecified street and highway as the place of occurrence of the external cause: Secondary | ICD-10-CM | POA: Insufficient documentation

## 2020-01-03 MED ORDER — ACETAMINOPHEN 325 MG PO TABS
650.0000 mg | ORAL_TABLET | Freq: Once | ORAL | Status: AC
  Administered 2020-01-03: 650 mg via ORAL
  Filled 2020-01-03: qty 2

## 2020-01-03 MED ORDER — DICLOFENAC SODIUM 1 % EX GEL: 2.0000 g | Freq: Four times a day (QID) | CUTANEOUS | 2 refills | Status: DC

## 2020-01-03 MED ORDER — OXYCODONE-ACETAMINOPHEN 5-325 MG PO TABS
1.0000 | ORAL_TABLET | Freq: Once | ORAL | Status: AC
  Administered 2020-01-03: 1 via ORAL
  Filled 2020-01-03: qty 1

## 2020-01-03 MED ORDER — METHOCARBAMOL 750 MG PO TABS: 750.0000 mg | ORAL_TABLET | Freq: Two times a day (BID) | ORAL | 0 refills | Status: DC | PRN

## 2020-01-03 MED ORDER — IBUPROFEN 400 MG PO TABS
600.0000 mg | ORAL_TABLET | Freq: Once | ORAL | Status: AC
  Administered 2020-01-03: 600 mg via ORAL
  Filled 2020-01-03: qty 1

## 2020-01-03 MED ORDER — METHOCARBAMOL 500 MG PO TABS
1000.0000 mg | ORAL_TABLET | Freq: Once | ORAL | Status: AC
  Administered 2020-01-03: 1000 mg via ORAL
  Filled 2020-01-03: qty 2

## 2020-01-03 NOTE — ED Notes (Signed)
Ortho tech done with placing arm sling on patient. Pt requested for additional pain medication prior to leaving.

## 2020-01-03 NOTE — ED Notes (Signed)
ED Provider at bedside. 
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
## 2020-01-03 NOTE — Discharge Instructions (Addendum)
Fracture care There is evidence of a fracture on the x-ray. Pain:  Antiinflammatory medications: Take 600 mg of ibuprofen every 6 hours or 440 mg (over the counter dose) to 500 mg (prescription dose) of naproxen every 12 hours for the next 3 days. After this time, these medications may be used as needed for pain. Take these medications with food to avoid upset stomach. Choose only one of these medications, do not take them together. Acetaminophen (generic for Tylenol): Should you continue to have additional pain while taking the ibuprofen or naproxen, you may add in acetaminophen as needed. Your daily total maximum amount of acetaminophen from all sources should be limited to 4000mg /day for persons without liver problems, or 2000mg /day for those with liver problems. Diclofenac gel: This is a topical anti-inflammatory medication and can be applied directly to the painful region.  Do not use on the face or genitals.  This medication may be used as an alternative to oral anti-inflammatory medications, such as ibuprofen or naproxen. Methocarbamol: Methocarbamol (generic for Robaxin) is a muscle relaxer and can help relieve stiff muscles or muscle spasms.  Do not drive or perform other dangerous activities while taking this medication as it can cause drowsiness as well as changes in reaction time and judgement. Ice: May apply ice to the injured area for no more than 15 minutes at a time to reduce swelling and pain. Elevation: Keep the extremity elevated whenever possible to reduce swelling and pain. Sling: Keep the arm in the sling most of the time to provide support and comfort to the injury.  This may be removed for bathing.  Adjustment of the sling would be necessary with neck pain, extremity tingling/numbness, paleness in the extremity, new weakness.   Follow-up: Follow-up with the orthopedic specialist for any further management of this issue.  Call the number provided to set up an appointment. Return:  Return to the emergency department for severely increased pain, numbness, blanching of the skin, or any other major concerns.

## 2020-01-03 NOTE — ED Notes (Signed)
Pt back to hallway, xrays done.
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
## 2020-01-04 NOTE — Progress Notes (Signed)
Orthopedic Tech Progress Note Patient Details:  Roger Woods 1970/01/25 [id number]  Ortho Devices Type of Ortho Device: Arm sling Ortho Device/Splint Location: lue Ortho Device/Splint Interventions: Ordered,Application,Adjustment   Post Interventions Patient Tolerated: Well Instructions Provided: Care of device,Adjustment of device   [name] 01/04/2020, 4:12 AM

## 2020-01-13 ENCOUNTER — Other Ambulatory Visit: Payer: Self-pay | Admitting: Orthopedic Surgery

## 2020-01-13 DIAGNOSIS — M25512 Pain in left shoulder: Secondary | ICD-10-CM

## 2020-01-26 ENCOUNTER — Other Ambulatory Visit: Payer: Medicaid Other

## 2020-03-05 ENCOUNTER — Other Ambulatory Visit: Payer: Self-pay

## 2020-03-05 ENCOUNTER — Emergency Department (HOSPITAL_COMMUNITY)
Admission: EM | Admit: 2020-03-05 | Discharge: 2020-03-05 | Disposition: A | Discharge status: Home / Self Care | Payer: Medicaid Other | Attending: Emergency Medicine | Admitting: Emergency Medicine

## 2020-03-05 ENCOUNTER — Encounter (HOSPITAL_COMMUNITY): Payer: Self-pay | Admitting: Emergency Medicine

## 2020-03-05 DIAGNOSIS — Z79899 Other long term (current) drug therapy: Secondary | ICD-10-CM | POA: Diagnosis not present

## 2020-03-05 DIAGNOSIS — L0501 Pilonidal cyst with abscess: Secondary | ICD-10-CM | POA: Diagnosis present

## 2020-03-05 DIAGNOSIS — F17210 Nicotine dependence, cigarettes, uncomplicated: Secondary | ICD-10-CM | POA: Insufficient documentation

## 2020-03-05 MED ORDER — HYDROCODONE-ACETAMINOPHEN 5-325 MG PO TABS
1.0000 | ORAL_TABLET | Freq: Once | ORAL | Status: AC
Start: 2020-03-05 — End: 2020-03-05
  Administered 2020-03-05: 1 via ORAL
  Filled 2020-03-05: qty 1

## 2020-03-05 MED ORDER — MELOXICAM 15 MG PO TABS: 15.0000 mg | ORAL_TABLET | Freq: Every day | ORAL | 0 refills | Status: DC

## 2020-03-05 MED ORDER — LIDOCAINE-EPINEPHRINE 1 %-1:100000 IJ SOLN
20.0000 mL | Freq: Once | INTRAMUSCULAR | Status: AC
  Administered 2020-03-05: 20 mL via INTRADERMAL
  Filled 2020-03-05: qty 1

## 2020-03-05 NOTE — ED Provider Notes (Signed)
[facility] EMERGENCY DEPARTMENT Provider Note   CSN: [id number] Arrival date & time: 03/05/20  1531     History Chief Complaint  Patient presents with  . Abscess    Roger Woods is a 50 y.o. male.  The history is provided by the patient.  Abscess Location:  Pelvis Pelvic abscess location:  Gluteal cleft Size:  8 cm Abscess quality: fluctuance, induration and painful   Red streaking: no   Duration:  1 week Progression:  Worsening Pain details:    Quality:  Pressure and aching   Severity:  Severe   Timing:  Constant   Progression:  Worsening Chronicity:  Recurrent Relieved by:  Nothing Worsened by:  Nothing Ineffective treatments:  Warm compresses and draining/squeezing Associated symptoms: no anorexia, no fatigue, no fever, no headaches, no nausea and no vomiting   Risk factors: prior abscess   Risk factors: no family hx of MRSA and no hx of MRSA        Past Medical History:  Diagnosis Date  . Chronic pain   . Headache(784.0)   . Hemorrhoids   . Insomnia due to medical condition   . Psychosis (HCC)   . Schizophrenia (HCC)   . Weakness    occasionally in both arms and hands;related to neck issues    Patient Active Problem List   Diagnosis Date Noted  . Pseudoarthrosis of cervical spine (HCC) 06/24/2013  . Hypersomnia with sleep apnea, unspecified 04/24/2013  . Narcotic drug use 04/24/2013  . 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 HARDWARE REMOVAL N/A 06/24/2013   Procedure: EXPLORATION/HARDWARE REMOVAL CERVICAL FOUR-FIVE, CERVICAL FIVE-SIX, AND CERVICAL SIX-SEVEN.;  Surgeon: Mariam Dollar, MD;  Location: [facility];  Service: Neurosurgery;  Laterality: N/A;  posterior   . POSTERIOR CERVICAL FUSION/FORAMINOTOMY N/A 11/19/2012   Procedure: CERVICAL TWO TO CERVICAL SEVEN POSTERIOR CERVICAL FUSION/FORAMINOTOMY LEVEL 5;  Surgeon: Mariam Dollar, MD;  Location: [facility];   Service: Neurosurgery;  Laterality: N/A;  C2-7 posteior cervical arthrodesis with instrumentation       Family History  Problem Relation Age of Onset  . High blood pressure Maternal Grandmother   . Cancer Other     Social History   Tobacco Use  . Smoking status: Current Every Day Smoker    Packs/day: 1.00    Years: 7.00    Pack years: 7.00    Types: Cigarettes  . Smokeless tobacco: Never Used  Substance Use Topics  . Alcohol use: No  . Drug use: No    Home Medications Prior to Admission medications   Medication Sig Start Date End Date Taking? Authorizing Provider  ALPRAZolam Prudy Feeler) 0.25 MG tablet take 1 tablet by mouth three times a day if needed for anxiety 01/26/15   [provider]  diclofenac Sodium (VOLTAREN) 1 % GEL Apply 2 g topically 4 (four) times daily. 01/03/20   Joy, Shawn C, PA-C  hydrochlorothiazide (MICROZIDE) 12.5 MG capsule Take 12.5 mg by mouth daily. 01/04/15   [provider]  HYDROcodone-acetaminophen (NORCO/VICODIN) 5-325 MG tablet Take 1 tablet by mouth every 4 (four) hours as needed for moderate pain. 05/04/18   Gilda Crease, MD  ibuprofen (ADVIL,MOTRIN) 800 MG tablet Take 1 tablet (800 mg total) by mouth 3 (three) times daily. 03/08/15   Roxy Horseman, PA-C  INVEGA SUSTENNA 234 MG/1.5ML SUSP injection INJECT Intramuscularly ONCE EVERY MONTH 02/25/15   [provider]  methocarbamol (ROBAXIN)  750 MG tablet Take 1 tablet (750 mg total) by mouth 2 (two) times daily as needed for muscle spasms. 01/03/20   Joy, Hillard Danker, PA-C  methylPREDNIsolone (MEDROL DOSPACK) 4 MG tablet follow package directions Patient not taking: Reported on 03/08/2015 06/26/13   Donalee Citrin, MD  naproxen (NAPROSYN) 500 MG tablet Take 1 tablet (500 mg total) by mouth 2 (two) times daily. 11/14/15   Felicie Morn, NP  oxyCODONE (ROXICODONE) 15 MG immediate release tablet Take 0.5 tablets (7.5 mg total) by mouth every 8 (eight) hours as needed. 11/14/15    Felicie Morn, NP  oxyCODONE-acetaminophen (PERCOCET/ROXICET) 5-325 MG tablet Take 1 tablet by mouth every 6 (six) hours as needed for severe pain. Patient not taking: Reported on 03/08/2015 02/06/15   Earley Favor, NP  predniSONE (DELTASONE) 20 MG tablet Take 2 tablets (40 mg total) by mouth daily. Patient not taking: Reported on 03/08/2015 12/08/13   Earley Favor, NP  sulfamethoxazole-trimethoprim (BACTRIM DS) 800-160 MG tablet Take 1 tablet by mouth 2 (two) times daily. 05/04/18   Gilda Crease, MD  Vitamin D, Ergocalciferol, (DRISDOL) 50000 units CAPS capsule Take 50,000 Units by mouth once a week. 01/04/15   [provider]    Allergies    Penicillins  Review of Systems   Review of Systems  Constitutional: Negative for fatigue and fever.  Gastrointestinal: Negative for anorexia, nausea and vomiting.  Neurological: Negative for headaches.    Physical Exam Updated Vital Signs BP (!) 147/97 (BP Location: Right Arm)   Pulse 80   Temp 98.9 F (37.2 C)   Resp 16   SpO2 96%   Physical Exam Vitals and nursing note reviewed.  Constitutional:      General: He is not in acute distress.    Appearance: He is well-developed and well-nourished. He is not diaphoretic.  HENT:     Head: Normocephalic and atraumatic.  Eyes:     General: No scleral icterus.    Conjunctiva/sclera: Conjunctivae normal.  Cardiovascular:     Rate and Rhythm: Normal rate and regular rhythm.     Heart sounds: Normal heart sounds.  Pulmonary:     Effort: Pulmonary effort is normal. No respiratory distress.     Breath sounds: Normal breath sounds.  Abdominal:     Palpations: Abdomen is soft.     Tenderness: There is no abdominal tenderness.  Musculoskeletal:        General: No edema.     Cervical back: Normal range of motion and neck supple.  Skin:    General: Skin is warm and dry.     Comments: Area of inuration and fluctuance at the left superior gluteal cleft consistent with pilonidal  abscess. Hx of same. Old I&D scars noted.  Neurological:     Mental Status: He is alert.  Psychiatric:        Behavior: Behavior normal.     ED Results / Procedures / Treatments   Labs (all labs ordered are listed, but only abnormal results are displayed) Labs Reviewed - No data to display  EKG None  Radiology No results found.  Procedures .Marland KitchenIncision and Drainage  Date/Time: 03/05/2020 5:05 PM Performed by: Arthor Captain, PA-C Authorized by: Arthor Captain, PA-C   Consent:    Consent obtained:  Verbal   Consent given by:  Patient   Risks discussed:  Bleeding, incomplete drainage, pain and damage to other organs   Alternatives discussed:  No treatment Universal protocol:    Procedure explained and questions answered  to patient or proxy's satisfaction: yes     Relevant documents present and verified: yes     Test results available : yes     Imaging studies available: yes     Required blood products, implants, devices, and special equipment available: yes     Site/side marked: yes     Immediately prior to procedure, a time out was called: yes     Patient identity confirmed:  Verbally with patient Location:    Type:  Pilonidal cyst   Size:  8   Location:  Anogenital   Anogenital location:  Gluteal cleft Pre-procedure details:    Skin preparation:  Betadine Anesthesia:    Anesthesia method:  Local infiltration   Local anesthetic:  Lidocaine 2% WITH epi Procedure type:    Complexity:  Complex Procedure details:    Needle aspiration: yes     Needle size:  18 G   Incision types:  Single straight and cruciate   Incision depth:  Subcutaneous   Scalpel blade:  11   Wound management:  Probed and deloculated, irrigated with saline and extensive cleaning   Drainage:  Purulent   Drainage amount:  Moderate   Wound treatment:  Wound left open   Packing materials:  1/4 in gauze Post-procedure details:    Procedure completion:  Tolerated well, no immediate  complications     Medications Ordered in ED Medications  HYDROcodone-acetaminophen (NORCO/VICODIN) 5-325 MG per tablet 1 tablet (has no administration in time range)  lidocaine-EPINEPHrine (XYLOCAINE W/EPI) 2 %-1:100000 (with pres) injection 20 mL (has no administration in time range)    ED Course  I have reviewed the triage vital signs and the nursing notes.  Pertinent labs & imaging results that were available during my care of the patient were reviewed by me and considered in my medical decision making (see chart for details).    MDM Rules/Calculators/A&P                          [name] presents with abscess. There is no area of retained pus after procedure. The presentation of BRAZEN DOMANGUE is NOT consistent with necrotizing fascitis or osteomyolitis. There is no evidence of retained foreign body, neurovascular or tendon injury. The presentation of LINKEN MCGLOTHEN is NOT consistent with sepsis and/or bacteremia. F/u in 2 days for packing removal.  Strict return and follow-up precautions have been given by me personally or by detailed written instructions verbalized by nursing staff using the teach back method to the patient/family/caregiver(s).  Data Reviewed/Counseling: I have reviewed the patient's vital signs, nursing notes, and other relevant tests/information. I had a detailed discussion regarding the historical points, exam findings, and any diagnostic results supporting the discharge diagnosis. I also discussed the need for outpatient follow-up and the need to return to the ED if symptoms worsen or if there are any questions or concerns that arise at home.  Final Clinical Impression(s) / ED Diagnoses Final diagnoses:  None    Rx / DC Orders ED Discharge Orders    None       Arthor Captain, PA-C 03/05/20 1707    Linwood Dibbles, MD 03/06/20 [phone]

## 2020-03-05 NOTE — ED Triage Notes (Signed)
C/o abscess on buttocks x 1 week.

## 2020-03-05 NOTE — Discharge Instructions (Addendum)
Please follow up in 2 days for packing removal. Your   Contact a health care provider if: You have redness, swelling, or more pain around your wound. You have more fluid or blood coming from your wound. You have new bleeding from your wound. Your wound feels warm to the touch. There is pus or a bad smell coming from your wound. You have pain that does not get better with medicine. You have a fever or chills. You have muscle aches. You are dizzy. You feel generally sick.

## 2020-03-28 ENCOUNTER — Other Ambulatory Visit: Payer: Self-pay | Admitting: Specialist

## 2020-03-28 DIAGNOSIS — M542 Cervicalgia: Secondary | ICD-10-CM

## 2020-03-29 ENCOUNTER — Ambulatory Visit
Admission: RE | Admit: 2020-03-29 | Discharge: 2020-03-29 | Disposition: A | Discharge status: Home / Self Care | Payer: Medicaid Other | Source: Ambulatory Visit | Attending: Specialist | Admitting: Specialist

## 2020-03-29 ENCOUNTER — Other Ambulatory Visit: Payer: Self-pay

## 2020-03-29 DIAGNOSIS — M542 Cervicalgia: Secondary | ICD-10-CM

## 2020-04-11 ENCOUNTER — Ambulatory Visit: Payer: Self-pay | Admitting: Orthopedic Surgery

## 2020-04-11 NOTE — Progress Notes (Signed)
Please place orders in epic pt. Scheduled for a preop. 

## 2020-04-11 NOTE — Patient Instructions (Signed)
DUE TO COVID-19 ONLY ONE VISITOR IS ALLOWED TO COME WITH YOU AND STAY IN THE WAITING ROOM ONLY DURING PRE OP AND PROCEDURE DAY OF SURGERY.  TWO VISITOR  MAY VISIT WITH YOU AFTER SURGERY IN YOUR PRIVATE ROOM DURING VISITING HOURS ONLY!  YOU NEED TO HAVE A COVID 19 TEST ON_4-11-22______ @_______ , THIS TEST MUST BE DONE BEFORE SURGERY,  COVID TESTING SITE [address] [place] ,   IT IS ON THE RIGHT GOING OUT [address] APPROXIMATELY  2 MINUTES PAST [employer] ON THE RIGHT. ONCE YOUR COVID TEST IS COMPLETED,  PLEASE BEGIN THE QUARANTINE INSTRUCTIONS AS OUTLINED IN YOUR HANDOUT.                TASMAN ZAPATA  04/11/2020   Your procedure is scheduled on: 04-20-20   Report to [facility] Main  Entrance   Report to admitting at      0950 AM     Call this number if you have problems the morning of surgery [phone]    Remember: Do not eat food  :After Midnight.  You may have clear liquids until 0850 am then nothing by mouth    CLEAR LIQUID DIET   Foods Allowed                                                                                 Foods Excluded  Black Coffee and tea, regular and decaf                                         liquids that you cannot  Plain Jell-O any favor except red or purple                                           see through such as: Fruit ices (not with fruit pulp)                                                      milk, soups, orange juice  Iced Popsicles                                                     All solid food Carbonated beverages, regular and diet                                    Cranberry, grape and apple juices Sports drinks like Gatorade Lightly seasoned clear broth or consume(fat free) Sugar, honey syrup   _____________________________________________________________________     BRUSH YOUR TEETH MORNING OF SURGERY AND RINSE YOUR MOUTH  OUT, NO CHEWING GUM CANDY OR MINTS.     Take  these medicines the morning of surgery with A SIP OF WATER: oxycodone, gabapentin                                 You may not have any metal on your body including hair pins and              piercings  Do not wear jewelry,  lotions, powders or perfumes, deodorant              Men may shave face and neck.   Do not bring valuables to the hospital. [facility] IS NOT             RESPONSIBLE   FOR VALUABLES.  Contacts, dentures or bridgework may not be worn into surgery.      Patients discharged the day of surgery will not be allowed to drive home. IF YOU ARE HAVING SURGERY AND GOING HOME THE SAME DAY, YOU MUST HAVE AN ADULT TO DRIVE YOU HOME AND BE WITH YOU FOR 24 HOURS. YOU MAY GO HOME BY TAXI OR UBER OR ORTHERWISE, BUT AN ADULT MUST ACCOMPANY YOU HOME AND STAY WITH YOU FOR 24 HOURS.  Name and phone number of your driver:  Special Instructions: N/A              Please read over the following fact sheets you were given: _____________________________________________________________________             [facility] - Preparing for Surgery Before surgery, you can play an important role.  Because skin is not sterile, your skin needs to be as free of germs as possible.  You can reduce the number of germs on your skin by washing with CHG (chlorahexidine gluconate) soap before surgery.  CHG is an antiseptic cleaner which kills germs and bonds with the skin to continue killing germs even after washing. Please DO NOT use if you have an allergy to CHG or antibacterial soaps.  If your skin becomes reddened/irritated stop using the CHG and inform your nurse when you arrive at Short Stay. Do not shave (including legs and underarms) for at least 48 hours prior to the first CHG shower.  You may shave your face/neck. Please follow these instructions carefully:  1.  Shower with CHG Soap the night before surgery and the  morning of Surgery.  2.  If you choose to wash your hair, wash your hair first as usual  with your  normal  shampoo.  3.  After you shampoo, rinse your hair and body thoroughly to remove the  shampoo.                           4.  Use CHG as you would any other liquid soap.  You can apply chg directly  to the skin and wash                       Gently with a scrungie or clean washcloth.  5.  Apply the CHG Soap to your body ONLY FROM THE NECK DOWN.   Do not use on face/ open                           Wound or open sores. Avoid contact with eyes,  ears mouth and genitals (private parts).                       Wash face,  Genitals (private parts) with your normal soap.             6.  Wash thoroughly, paying special attention to the area where your surgery  will be performed.  7.  Thoroughly rinse your body with warm water from the neck down.  8.  DO NOT shower/wash with your normal soap after using and rinsing off  the CHG Soap.                9.  Pat yourself dry with a clean towel.            10.  Wear clean pajamas.            11.  Place clean sheets on your bed the night of your first shower and do not  sleep with pets. Day of Surgery : Do not apply any lotions/deodorants the morning of surgery.  Please wear clean clothes to the hospital/surgery center.  FAILURE TO FOLLOW THESE INSTRUCTIONS MAY RESULT IN THE CANCELLATION OF YOUR SURGERY PATIENT SIGNATURE_________________________________  NURSE SIGNATURE__________________________________  ________________________________________________________________________

## 2020-04-13 ENCOUNTER — Encounter (HOSPITAL_COMMUNITY)
Admission: RE | Admit: 2020-04-13 | Discharge: 2020-04-13 | Disposition: A | Discharge status: Home / Self Care | Payer: Medicaid Other | Source: Ambulatory Visit | Attending: Specialist | Admitting: Specialist

## 2020-04-13 ENCOUNTER — Other Ambulatory Visit: Payer: Self-pay

## 2020-04-13 ENCOUNTER — Encounter (HOSPITAL_COMMUNITY): Payer: Self-pay

## 2020-04-13 ENCOUNTER — Ambulatory Visit: Payer: Self-pay | Admitting: Orthopedic Surgery

## 2020-04-13 DIAGNOSIS — Z01812 Encounter for preprocedural laboratory examination: Secondary | ICD-10-CM | POA: Diagnosis present

## 2020-04-13 LAB — BASIC METABOLIC PANEL
Anion gap: 6 (ref 5–15)
BUN: 13 mg/dL (ref 6–20)
CO2: 24 mmol/L (ref 22–32)
Calcium: 9.3 mg/dL (ref 8.9–10.3)
Chloride: 109 mmol/L (ref 98–111)
Creatinine, Ser: 0.94 mg/dL (ref 0.61–1.24)
GFR, Estimated: >60 mL/min (ref >60)
Glucose, Bld: 94 mg/dL (ref 70–99)
Potassium: 4.2 mmol/L (ref 3.5–5.1)
Sodium: 139 mmol/L (ref 135–145)

## 2020-04-13 LAB — CBC
HCT: 40.4 % (ref 39.0–52.0)
Hemoglobin: 13.2 g/dL (ref 13.0–17.0)
MCH: 29.4 pg (ref 26.0–34.0)
MCHC: 32.7 g/dL (ref 30.0–36.0)
MCV: 90 fL (ref 80.0–100.0)
Platelets: 212 10*3/uL (ref 150–400)
RBC: 4.49 MIL/uL (ref 4.22–5.81)
RDW: 14.1 % (ref 11.5–15.5)
WBC: 6.4 10*3/uL (ref 4.0–10.5)
nRBC: 0 % (ref 0.0–0.2)
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
## 2020-04-13 NOTE — Progress Notes (Addendum)
PCP - [facility] Cardiologist - no  PPM/ICD -  Device Orders -  Rep Notified -   Chest x-ray -  EKG -  Stress Test -  ECHO -  Cardiac Cath -   Sleep Study -  CPAP -   Fasting Blood Sugar -  Checks Blood Sugar _____ times a day  Blood Thinner Instructions: Aspirin Instructions:  ERAS Protcol - PRE-SURGERY Ensure or G2-   COVID TEST- 4-11  Activity--able to walk a flight of stairs without SOB Anesthesia review: Hx of schizophrenia  Patient denies shortness of breath, fever, cough and chest pain at PAT appointment    All instructions explained to the patient, with a verbal understanding of the material. Patient agrees to go over the instructions while at home for a better understanding. Patient also instructed to self quarantine after being tested for COVID-19. The opportunity to ask questions was provided.

## 2020-04-18 ENCOUNTER — Other Ambulatory Visit (HOSPITAL_COMMUNITY)
Admission: RE | Admit: 2020-04-18 | Discharge: 2020-04-18 | Disposition: A | Discharge status: Home / Self Care | Payer: Medicaid Other | Source: Ambulatory Visit | Attending: Specialist | Admitting: Specialist

## 2020-04-18 DIAGNOSIS — Z20822 Contact with and (suspected) exposure to covid-19: Secondary | ICD-10-CM | POA: Insufficient documentation

## 2020-04-18 DIAGNOSIS — Z01812 Encounter for preprocedural laboratory examination: Secondary | ICD-10-CM | POA: Insufficient documentation

## 2020-04-19 LAB — SARS CORONAVIRUS 2 (TAT 6-24 HRS): SARS Coronavirus 2: NEGATIVE

## 2020-04-20 ENCOUNTER — Encounter (HOSPITAL_COMMUNITY): Payer: Self-pay | Admitting: Specialist

## 2020-04-20 ENCOUNTER — Ambulatory Visit (HOSPITAL_COMMUNITY): Payer: Medicaid Other | Admitting: Anesthesiology

## 2020-04-20 ENCOUNTER — Ambulatory Visit (HOSPITAL_COMMUNITY)
Admission: RE | Admit: 2020-04-20 | Discharge: 2020-04-20 | Disposition: A | Discharge status: Home / Self Care | Payer: Medicaid Other | Source: Ambulatory Visit | Attending: Specialist | Admitting: Specialist

## 2020-04-20 ENCOUNTER — Encounter (HOSPITAL_COMMUNITY)
Admission: RE | Disposition: A | Discharge status: Home / Self Care | Payer: Self-pay | Source: Ambulatory Visit | Attending: Specialist

## 2020-04-20 DIAGNOSIS — Z79899 Other long term (current) drug therapy: Secondary | ICD-10-CM | POA: Diagnosis not present

## 2020-04-20 DIAGNOSIS — Z885 Allergy status to narcotic agent status: Secondary | ICD-10-CM | POA: Insufficient documentation

## 2020-04-20 DIAGNOSIS — M7542 Impingement syndrome of left shoulder: Secondary | ICD-10-CM | POA: Diagnosis not present

## 2020-04-20 DIAGNOSIS — Z79891 Long term (current) use of opiate analgesic: Secondary | ICD-10-CM | POA: Diagnosis not present

## 2020-04-20 DIAGNOSIS — F17210 Nicotine dependence, cigarettes, uncomplicated: Secondary | ICD-10-CM | POA: Diagnosis not present

## 2020-04-20 DIAGNOSIS — Z981 Arthrodesis status: Secondary | ICD-10-CM | POA: Insufficient documentation

## 2020-04-20 DIAGNOSIS — S46012A Strain of muscle(s) and tendon(s) of the rotator cuff of left shoulder, initial encounter: Secondary | ICD-10-CM | POA: Insufficient documentation

## 2020-04-20 DIAGNOSIS — Z880 Allergy status to penicillin: Secondary | ICD-10-CM | POA: Insufficient documentation

## 2020-04-20 HISTORY — PX: SHOULDER ARTHROSCOPY WITH ROTATOR CUFF REPAIR AND SUBACROMIAL DECOMPRESSION: SHX5686

## 2020-04-20 SURGERY — SHOULDER ARTHROSCOPY WITH ROTATOR CUFF REPAIR AND SUBACROMIAL DECOMPRESSION
Anesthesia: Regional | Site: Shoulder | Laterality: Left

## 2020-04-20 MED ORDER — VANCOMYCIN HCL 1000 MG IV SOLR
1000.0000 mg | Freq: Two times a day (BID) | INTRAVENOUS | Status: DC
  Administered 2020-04-20: 1000 mg via INTRAVENOUS

## 2020-04-20 MED ORDER — LACTATED RINGERS IR SOLN
Status: DC | PRN
  Administered 2020-04-20: 3000 mL

## 2020-04-20 MED ORDER — PROMETHAZINE HCL 25 MG/ML IJ SOLN: 6.2500 mg | INTRAMUSCULAR | Status: DC | PRN

## 2020-04-20 MED ORDER — HYDROMORPHONE HCL 1 MG/ML IJ SOLN
INTRAMUSCULAR | Status: AC
  Administered 2020-04-20: 0.5 mg via INTRAVENOUS
  Filled 2020-04-20: qty 1

## 2020-04-20 MED ORDER — KETAMINE HCL 10 MG/ML IJ SOLN
INTRAMUSCULAR | Status: AC
  Filled 2020-04-20: qty 1

## 2020-04-20 MED ORDER — PROPOFOL 10 MG/ML IV BOLUS
INTRAVENOUS | Status: DC | PRN
  Administered 2020-04-20: 200 mg via INTRAVENOUS

## 2020-04-20 MED ORDER — ROCURONIUM BROMIDE 10 MG/ML (PF) SYRINGE
PREFILLED_SYRINGE | INTRAVENOUS | Status: AC
  Filled 2020-04-20: qty 10

## 2020-04-20 MED ORDER — GENTAMICIN IN SALINE 1-0.9 MG/ML-% IV SOLN
100.0000 mg | Freq: Once | INTRAVENOUS | Status: AC
  Administered 2020-04-20: 100 mg via INTRAVENOUS
  Filled 2020-04-20: qty 100

## 2020-04-20 MED ORDER — DEXAMETHASONE SODIUM PHOSPHATE 10 MG/ML IJ SOLN
INTRAMUSCULAR | Status: DC | PRN
  Administered 2020-04-20: 10 mg via INTRAVENOUS

## 2020-04-20 MED ORDER — OXYCODONE HCL 5 MG PO TABS
ORAL_TABLET | ORAL | Status: AC
  Administered 2020-04-20: 5 mg via ORAL
  Filled 2020-04-20: qty 1

## 2020-04-20 MED ORDER — PHENYLEPHRINE HCL-NACL 10-0.9 MG/250ML-% IV SOLN
INTRAVENOUS | Status: DC | PRN
  Administered 2020-04-20: 35 ug/min via INTRAVENOUS

## 2020-04-20 MED ORDER — MIDAZOLAM HCL 2 MG/2ML IJ SOLN
1.0000 mg | Freq: Once | INTRAMUSCULAR | Status: DC
  Filled 2020-04-20: qty 2

## 2020-04-20 MED ORDER — EPHEDRINE SULFATE-NACL 50-0.9 MG/10ML-% IV SOSY
PREFILLED_SYRINGE | INTRAVENOUS | Status: DC | PRN
  Administered 2020-04-20 (×3): 10 mg via INTRAVENOUS

## 2020-04-20 MED ORDER — MIDAZOLAM HCL 2 MG/2ML IJ SOLN
INTRAMUSCULAR | Status: AC
  Filled 2020-04-20: qty 2

## 2020-04-20 MED ORDER — PROPOFOL 10 MG/ML IV BOLUS
INTRAVENOUS | Status: AC
  Filled 2020-04-20: qty 20

## 2020-04-20 MED ORDER — LIDOCAINE 2% (20 MG/ML) 5 ML SYRINGE
INTRAMUSCULAR | Status: DC | PRN
  Administered 2020-04-20: 60 mg via INTRAVENOUS

## 2020-04-20 MED ORDER — ASPIRIN EC 81 MG PO TBEC
81.0000 mg | DELAYED_RELEASE_TABLET | Freq: Two times a day (BID) | ORAL | 1 refills | Status: DC
Start: 2020-04-20 — End: 2021-07-10

## 2020-04-20 MED ORDER — POLYETHYLENE GLYCOL 3350 17 G PO PACK: 17.0000 g | PACK | Freq: Every day | ORAL | 0 refills | Status: DC

## 2020-04-20 MED ORDER — DEXAMETHASONE SODIUM PHOSPHATE 10 MG/ML IJ SOLN
INTRAMUSCULAR | Status: AC
  Filled 2020-04-20: qty 1

## 2020-04-20 MED ORDER — FENTANYL CITRATE (PF) 100 MCG/2ML IJ SOLN
INTRAMUSCULAR | Status: AC
  Filled 2020-04-20: qty 2

## 2020-04-20 MED ORDER — GLYCOPYRROLATE PF 0.2 MG/ML IJ SOSY
PREFILLED_SYRINGE | INTRAMUSCULAR | Status: DC | PRN
  Administered 2020-04-20: .2 mg via INTRAVENOUS

## 2020-04-20 MED ORDER — MIDAZOLAM HCL 5 MG/5ML IJ SOLN
INTRAMUSCULAR | Status: DC | PRN
  Administered 2020-04-20: 2 mg via INTRAVENOUS

## 2020-04-20 MED ORDER — EPINEPHRINE PF 1 MG/ML IJ SOLN
INTRAMUSCULAR | Status: AC
  Filled 2020-04-20: qty 2

## 2020-04-20 MED ORDER — HYDROMORPHONE HCL 1 MG/ML IJ SOLN
0.5000 mg | INTRAMUSCULAR | Status: AC | PRN
  Administered 2020-04-20 (×2): 0.5 mg via INTRAVENOUS

## 2020-04-20 MED ORDER — FENTANYL CITRATE (PF) 100 MCG/2ML IJ SOLN
25.0000 ug | INTRAMUSCULAR | Status: DC | PRN
  Administered 2020-04-20: 50 ug via INTRAVENOUS

## 2020-04-20 MED ORDER — GLYCOPYRROLATE PF 0.2 MG/ML IJ SOSY
PREFILLED_SYRINGE | INTRAMUSCULAR | Status: AC
  Filled 2020-04-20: qty 1

## 2020-04-20 MED ORDER — FENTANYL CITRATE (PF) 100 MCG/2ML IJ SOLN
25.0000 ug | Freq: Once | INTRAMUSCULAR | Status: DC
  Filled 2020-04-20: qty 2

## 2020-04-20 MED ORDER — ROCURONIUM BROMIDE 10 MG/ML (PF) SYRINGE
PREFILLED_SYRINGE | INTRAVENOUS | Status: DC | PRN
  Administered 2020-04-20: 60 mg via INTRAVENOUS
  Administered 2020-04-20 (×2): 10 mg via INTRAVENOUS

## 2020-04-20 MED ORDER — KETOROLAC TROMETHAMINE 30 MG/ML IJ SOLN: 30.0000 mg | Freq: Once | INTRAMUSCULAR | Status: AC | PRN

## 2020-04-20 MED ORDER — EPHEDRINE 5 MG/ML INJ
INTRAVENOUS | Status: AC
  Filled 2020-04-20: qty 10

## 2020-04-20 MED ORDER — EPINEPHRINE PF 1 MG/ML IJ SOLN
INTRAMUSCULAR | Status: DC | PRN
  Administered 2020-04-20: 1 mg

## 2020-04-20 MED ORDER — BUPIVACAINE-EPINEPHRINE (PF) 0.5% -1:200000 IJ SOLN
INTRAMUSCULAR | Status: AC
  Filled 2020-04-20: qty 30

## 2020-04-20 MED ORDER — FENTANYL CITRATE (PF) 100 MCG/2ML IJ SOLN
INTRAMUSCULAR | Status: AC
  Administered 2020-04-20: 50 ug via INTRAVENOUS
  Filled 2020-04-20: qty 2

## 2020-04-20 MED ORDER — VANCOMYCIN HCL IN DEXTROSE 1-5 GM/200ML-% IV SOLN
INTRAVENOUS | Status: AC
  Filled 2020-04-20: qty 200

## 2020-04-20 MED ORDER — LACTATED RINGERS IV SOLN: INTRAVENOUS | Status: DC

## 2020-04-20 MED ORDER — DEXMEDETOMIDINE (PRECEDEX) IN NS 20 MCG/5ML (4 MCG/ML) IV SYRINGE
PREFILLED_SYRINGE | INTRAVENOUS | Status: AC
  Filled 2020-04-20: qty 5

## 2020-04-20 MED ORDER — OXYCODONE HCL 5 MG/5ML PO SOLN: 5.0000 mg | Freq: Once | ORAL | Status: AC | PRN

## 2020-04-20 MED ORDER — ACETAMINOPHEN 500 MG PO TABS
1000.0000 mg | ORAL_TABLET | Freq: Once | ORAL | Status: AC
  Administered 2020-04-20: 1000 mg via ORAL
  Filled 2020-04-20: qty 2

## 2020-04-20 MED ORDER — FENTANYL CITRATE (PF) 100 MCG/2ML IJ SOLN
INTRAMUSCULAR | Status: DC | PRN
  Administered 2020-04-20 (×2): 50 ug via INTRAVENOUS
  Administered 2020-04-20: 100 ug via INTRAVENOUS

## 2020-04-20 MED ORDER — CHLORHEXIDINE GLUCONATE 0.12 % MT SOLN
15.0000 mL | Freq: Once | OROMUCOSAL | Status: AC
  Administered 2020-04-20: 15 mL via OROMUCOSAL

## 2020-04-20 MED ORDER — KETAMINE HCL 10 MG/ML IJ SOLN
INTRAMUSCULAR | Status: DC | PRN
  Administered 2020-04-20: 50 mg via INTRAVENOUS
  Administered 2020-04-20: 30 mg via INTRAVENOUS

## 2020-04-20 MED ORDER — ONDANSETRON HCL 4 MG/2ML IJ SOLN
INTRAMUSCULAR | Status: AC
  Filled 2020-04-20: qty 2

## 2020-04-20 MED ORDER — LIDOCAINE 2% (20 MG/ML) 5 ML SYRINGE
INTRAMUSCULAR | Status: AC
  Filled 2020-04-20: qty 5

## 2020-04-20 MED ORDER — OXYCODONE HCL 5 MG PO TABS
5.0000 mg | ORAL_TABLET | Freq: Once | ORAL | Status: AC | PRN
Start: 2020-04-20 — End: 2020-04-20

## 2020-04-20 MED ORDER — OXYCODONE HCL 5 MG PO TABS: 5.0000 mg | ORAL_TABLET | Freq: Every day | ORAL | 0 refills | Status: DC

## 2020-04-20 MED ORDER — DOCUSATE SODIUM 100 MG PO CAPS: 100.0000 mg | ORAL_CAPSULE | Freq: Two times a day (BID) | ORAL | 1 refills | Status: DC | PRN

## 2020-04-20 MED ORDER — AMISULPRIDE (ANTIEMETIC) 5 MG/2ML IV SOLN: 10.0000 mg | Freq: Once | INTRAVENOUS | Status: DC | PRN

## 2020-04-20 MED ORDER — ONDANSETRON HCL 4 MG/2ML IJ SOLN
INTRAMUSCULAR | Status: DC | PRN
  Administered 2020-04-20: 4 mg via INTRAVENOUS

## 2020-04-20 MED ORDER — SUGAMMADEX SODIUM 200 MG/2ML IV SOLN
INTRAVENOUS | Status: DC | PRN
  Administered 2020-04-20: 200 mg via INTRAVENOUS

## 2020-04-20 MED ORDER — DEXMEDETOMIDINE (PRECEDEX) IN NS 20 MCG/5ML (4 MCG/ML) IV SYRINGE
PREFILLED_SYRINGE | INTRAVENOUS | Status: DC | PRN
  Administered 2020-04-20: 8 ug via INTRAVENOUS
  Administered 2020-04-20: 4 ug via INTRAVENOUS
  Administered 2020-04-20: 8 ug via INTRAVENOUS

## 2020-04-20 MED ORDER — BUPIVACAINE-EPINEPHRINE 0.5% -1:200000 IJ SOLN
INTRAMUSCULAR | Status: DC | PRN
  Administered 2020-04-20: 30 mL

## 2020-04-20 MED ORDER — KETOROLAC TROMETHAMINE 30 MG/ML IJ SOLN
INTRAMUSCULAR | Status: AC
  Administered 2020-04-20: 30 mg via INTRAVENOUS
  Filled 2020-04-20: qty 1

## 2020-04-20 MED ORDER — ORAL CARE MOUTH RINSE: 15.0000 mL | Freq: Once | OROMUCOSAL | Status: AC

## 2020-04-20 SURGICAL SUPPLY — 70 items
AID PSTN UNV HD RSTRNT DISP (MISCELLANEOUS) ×1
ANCH SUT 2 19.1 W/FIBERTAPE (Anchor) ×2 IMPLANT
ANCHOR NDL 9/16 CIR SZ 8 (NEEDLE) IMPLANT
ANCHOR NEEDLE 9/16 CIR SZ 8 (NEEDLE) IMPLANT
ANCHOR SL BIO 4.75 W/FIBERTAPE (Anchor) ×2 IMPLANT
BLADE EXCALIBUR 4.0X13 (MISCELLANEOUS) ×2 IMPLANT
BLADE SURG SZ11 CARB STEEL (BLADE) ×2 IMPLANT
CANNULA ACUFO 5X76 (CANNULA) ×2 IMPLANT
CLEANER TIP ELECTROSURG 2X2 (MISCELLANEOUS) IMPLANT
COVER SURGICAL LIGHT HANDLE (MISCELLANEOUS) ×2 IMPLANT
COVER WAND RF STERILE (DRAPES) IMPLANT
DISSECTOR  3.8MM X 13CM (MISCELLANEOUS)
DISSECTOR 3.5MM X 13CM (MISCELLANEOUS) IMPLANT
DISSECTOR 3.8MM X 13CM (MISCELLANEOUS) IMPLANT
DRAPE POUCH INSTRU U-SHP 10X18 (DRAPES) ×2 IMPLANT
DRAPE STERI 35X30 U-POUCH (DRAPES) ×2 IMPLANT
DRESSING AQUACEL AG SP 3.5X4 (GAUZE/BANDAGES/DRESSINGS) IMPLANT
DRSG AQUACEL AG ADV 3.5X 4 (GAUZE/BANDAGES/DRESSINGS) IMPLANT
DRSG AQUACEL AG ADV 3.5X 6 (GAUZE/BANDAGES/DRESSINGS) IMPLANT
DRSG AQUACEL AG SP 3.5X4 (GAUZE/BANDAGES/DRESSINGS) ×2
DRSG PAD ABDOMINAL 8X10 ST (GAUZE/BANDAGES/DRESSINGS) IMPLANT
DURAPREP 26ML APPLICATOR (WOUND CARE) ×2 IMPLANT
ELECT NDL TIP 2.8 STRL (NEEDLE) ×1 IMPLANT
ELECT NEEDLE TIP 2.8 STRL (NEEDLE) ×2 IMPLANT
ELECT REM PT RETURN 15FT ADLT (MISCELLANEOUS) ×2 IMPLANT
FILTER STRAW (MISCELLANEOUS) ×2 IMPLANT
GLOVE SRG 8 PF TXTR STRL LF DI (GLOVE) ×1 IMPLANT
GLOVE SURG POLYISO LF SZ7.5 (GLOVE) ×4 IMPLANT
GLOVE SURG POLYISO LF SZ8 (GLOVE) ×4 IMPLANT
GLOVE SURG UNDER POLY LF SZ7.5 (GLOVE) ×2 IMPLANT
GLOVE SURG UNDER POLY LF SZ8 (GLOVE) ×2
GOWN STRL REUS W/TWL XL LVL3 (GOWN DISPOSABLE) ×4 IMPLANT
KIT BASIN OR (CUSTOM PROCEDURE TRAY) ×4 IMPLANT
KIT TURNOVER KIT A (KITS) ×2 IMPLANT
MANIFOLD NEPTUNE II (INSTRUMENTS) ×2 IMPLANT
NDL SCORPION MULTI FIRE (NEEDLE) IMPLANT
NDL SPNL 18GX3.5 QUINCKE PK (NEEDLE) ×1 IMPLANT
NEEDLE SCORPION MULTI FIRE (NEEDLE) ×2 IMPLANT
NEEDLE SPNL 18GX3.5 QUINCKE PK (NEEDLE) ×2 IMPLANT
PACK SHOULDER (CUSTOM PROCEDURE TRAY) ×2 IMPLANT
PAD ORTHO SHOULDER 7X19 LRG (SOFTGOODS) ×1 IMPLANT
PENCIL SMOKE EVACUATOR (MISCELLANEOUS) IMPLANT
PORT APPOLLO RF 90DEGREE MULTI (SURGICAL WAND) ×1 IMPLANT
PROTECTOR NERVE ULNAR (MISCELLANEOUS) ×2 IMPLANT
RESTRAINT HEAD UNIVERSAL NS (MISCELLANEOUS) ×1 IMPLANT
SLING ARM IMMOBILIZER LRG (SOFTGOODS) IMPLANT
SLING ARM IMMOBILIZER MED (SOFTGOODS) IMPLANT
SLING ULTRA II L (ORTHOPEDIC SUPPLIES) ×1 IMPLANT
STRIP CLOSURE SKIN 1/2X4 (GAUZE/BANDAGES/DRESSINGS) ×1 IMPLANT
SUCTION FRAZIER HANDLE 10FR (MISCELLANEOUS) ×2
SUCTION FRAZIER HANDLE 12FR (TUBING) ×2
SUCTION TUBE FRAZIER 10FR DISP (MISCELLANEOUS) IMPLANT
SUCTION TUBE FRAZIER 12FR DISP (TUBING) ×1 IMPLANT
SUT ETHIBOND NAB CT1 #1 30IN (SUTURE) IMPLANT
SUT ETHILON 4 0 PS 2 18 (SUTURE) ×2 IMPLANT
SUT FIBERWIRE #2 38 T-5 BLUE (SUTURE) ×2
SUT PROLENE 3 0 PS 2 (SUTURE) ×1 IMPLANT
SUT TIGER TAPE 7 IN WHITE (SUTURE) IMPLANT
SUT VIC AB 0 CT1 27 (SUTURE) ×2
SUT VIC AB 0 CT1 27XBRD ANTBC (SUTURE) ×1 IMPLANT
SUT VIC AB 1-0 CT2 27 (SUTURE) IMPLANT
SUT VIC AB 2-0 CT2 27 (SUTURE) IMPLANT
SUT VICRYL 0 UR6 27IN ABS (SUTURE) ×2 IMPLANT
SUTURE FIBERWR #2 38 T-5 BLUE (SUTURE) IMPLANT
SYR 20ML LL LF (SYRINGE) ×2 IMPLANT
TAPE FIBER 2MM 7IN #2 BLUE (SUTURE) ×1 IMPLANT
TOWEL OR 17X26 10 PK STRL BLUE (TOWEL DISPOSABLE) ×2 IMPLANT
TUBING ARTHROSCOPY IRRIG 16FT (MISCELLANEOUS) ×2 IMPLANT
TUBING CONNECTING 10 (TUBING) ×4 IMPLANT
WIPE CHG CHLORHEXIDINE 2% (PERSONAL CARE ITEMS) ×2 IMPLANT

## 2020-04-20 NOTE — Anesthesia Preprocedure Evaluation (Addendum)
Anesthesia Evaluation  Patient identified by MRN, date of birth, ID band Patient awake    Reviewed: Allergy & Precautions, NPO status , Patient's Chart, lab work & pertinent test results  Airway Mallampati: III  TM Distance: >3 FB Neck ROM: Limited    Dental no notable dental hx.    Pulmonary Current Smoker and Patient abstained from smoking.,    Pulmonary exam normal breath sounds clear to auscultation       Cardiovascular negative cardio ROS Normal cardiovascular exam Rhythm:Regular Rate:Normal     Neuro/Psych  Headaches, PSYCHIATRIC DISORDERS Schizophrenia Schizophrenia  Psychosis    GI/Hepatic negative GI ROS, (+)     substance abuse  ,   Endo/Other  negative endocrine ROS  Renal/GU negative Renal ROS     Musculoskeletal  (+) Arthritis , narcotic dependent  Abdominal (+) + obese,   Peds  Hematology negative hematology ROS (+)   Anesthesia Other Findings Left shoulder rotator cuff tear  Reproductive/Obstetrics                           Anesthesia Physical Anesthesia Plan  ASA: II  Anesthesia Plan: General and Regional   Post-op Pain Management: GA combined w/ Regional for post-op pain   Induction: Intravenous  PONV Risk Score and Plan: 1 and Ondansetron, Dexamethasone, Midazolam and Treatment may vary due to age or medical condition  Airway Management Planned: Oral ETT and Video Laryngoscope Planned  Additional Equipment:   Intra-op Plan:   Post-operative Plan: Extubation in OR  Informed Consent: I have reviewed the patients History and Physical, chart, labs and discussed the procedure including the risks, benefits and alternatives for the proposed anesthesia with the patient or authorized representative who has indicated his/her understanding and acceptance.     Dental advisory given  Plan Discussed with: CRNA  Anesthesia Plan Comments:       Anesthesia Quick  Evaluation

## 2020-04-20 NOTE — Transfer of Care (Signed)
Immediate Anesthesia Transfer of Care Note  Patient: Roger Woods  Procedure(s) Performed: SHOULDER ARTHROSCOPY WITH MINI OPEN  ROTATOR CUFF REPAIR AND SUBACROMIAL DECOMPRESSION (Left Shoulder)  Patient Location: PACU  Anesthesia Type:General  Level of Consciousness: awake, alert  and oriented  Airway & Oxygen Therapy: Patient Spontanous Breathing and Patient connected to face mask oxygen  Post-op Assessment: Report given to RN and Post -op Vital signs reviewed and stable  Post vital signs: Reviewed and stable  Last Vitals:  Vitals Value Taken Time  BP 131/67 04/20/20 1415  Temp    Pulse 79 04/20/20 1418  Resp 20 04/20/20 1418  SpO2 100 % 04/20/20 1418  Vitals shown include unvalidated device data.  Last Pain:  Vitals:   04/20/20 0942  TempSrc: Oral  PainSc: 9       Patients Stated Pain Goal: 4 (04/20/20 0942)  Complications: No complications documented.

## 2020-04-20 NOTE — Anesthesia Postprocedure Evaluation (Signed)
Anesthesia Post Note  Patient: SAMRAT HAYWARD  Procedure(s) Performed: SHOULDER ARTHROSCOPY WITH MINI OPEN  ROTATOR CUFF REPAIR AND SUBACROMIAL DECOMPRESSION (Left Shoulder)     Patient location during evaluation: PACU Anesthesia Type: General Level of consciousness: awake and alert Pain management: pain level controlled Vital Signs Assessment: post-procedure vital signs reviewed and stable Respiratory status: spontaneous breathing, nonlabored ventilation, respiratory function stable and patient connected to nasal cannula oxygen Cardiovascular status: blood pressure returned to baseline and stable Postop Assessment: no apparent nausea or vomiting Anesthetic complications: no   No complications documented.  Last Vitals:  Vitals:   04/20/20 1415 04/20/20 1430  BP: 131/67 135/72  Pulse: 87 78  Resp: (!) 28 (!) 24  Temp: 36.6 C   SpO2: 100% 100%    Last Pain:  Vitals:   04/20/20 1415  TempSrc:   PainSc: 0-No pain                 Lathon Adan S

## 2020-04-20 NOTE — Anesthesia Procedure Notes (Signed)
Procedure Name: Intubation Date/Time: 04/20/2020 11:51 AM Performed by: Orest Dikes, CRNA Pre-anesthesia Checklist: Patient identified, Emergency Drugs available, Suction available and Patient being monitored Patient Re-evaluated:Patient Re-evaluated prior to induction Oxygen Delivery Method: Circle system utilized Preoxygenation: Pre-oxygenation with 100% oxygen Induction Type: IV induction Ventilation: Mask ventilation without difficulty and Oral airway inserted - appropriate to patient size Laryngoscope Size: Glidescope and 4 Grade View: Grade I Tube size: 7.5 mm Number of attempts: 1 Airway Equipment and Method: Video-laryngoscopy Placement Confirmation: ETT inserted through vocal cords under direct vision,  positive ETCO2 and breath sounds checked- equal and bilateral Secured at: 21 cm Tube secured with: Tape Dental Injury: Teeth and Oropharynx as per pre-operative assessment  Difficulty Due To: Difficulty was anticipated and Difficult Airway- due to reduced neck mobility Comments: Elective Glidescope intubation due to prior surgical history and limited neck mobility. Glidescope #4 utilized with Grade 1 view and 7.5 ETT placed with ease.

## 2020-04-20 NOTE — Discharge Instructions (Signed)

## 2020-04-20 NOTE — Brief Op Note (Signed)
04/20/2020  1:47 PM  PATIENT:  Roger Woods  50 y.o. male  PRE-OPERATIVE DIAGNOSIS:  Left shoulder rotator cuff tear  POST-OPERATIVE DIAGNOSIS:  Left shoulder rotator cuff tear  PROCEDURE:  Procedure(s) with comments: SHOULDER ARTHROSCOPY WITH MINI OPEN  ROTATOR CUFF REPAIR AND SUBACROMIAL DECOMPRESSION (Left) - 90 MINS  SURGEON:  Surgeon(s) and Role:    Jene Every, MD - Primary  PHYSICIAN ASSISTANT:   ASSISTANTS: Bissell   ANESTHESIA:   general  EBL:  25 mL   BLOOD ADMINISTERED:none  DRAINS: none   LOCAL MEDICATIONS USED:  MARCAINE     SPECIMEN:  No Specimen  DISPOSITION OF SPECIMEN:  N/A  COUNTS:  yes  TOURNIQUET:  * No tourniquets in log *  DICTATION: .Other Dictation: Dictation Number [id number]  PLAN OF CARE: Discharge to home after PACU  PATIENT DISPOSITION:  PACU - hemodynamically stable.   Delay start of Pharmacological VTE agent (>24hrs) due to surgical blood loss or risk of bleeding: no
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
## 2020-04-24 ENCOUNTER — Encounter (HOSPITAL_COMMUNITY): Payer: Self-pay | Admitting: Specialist

## 2020-04-29 NOTE — Interval H&P Note (Signed)
History and Physical Interval Note:  04/29/2020 8:27 AM  Roger Woods Guardian  has presented today for surgery, with the diagnosis of Left shoulder rotator cuff tear.  The various methods of treatment have been discussed with the patient and family. After consideration of risks, benefits and other options for treatment, the patient has consented to  Procedure(s) with comments: SHOULDER ARTHROSCOPY WITH MINI OPEN  ROTATOR CUFF REPAIR AND SUBACROMIAL DECOMPRESSION (Left) - 90 MINS as a surgical intervention.  The patient's history has been reviewed, patient examined, no change in status, stable for surgery.  I have reviewed the patient's chart and labs.  Questions were answered to the patient's satisfaction.     Javier Docker

## 2020-06-01 ENCOUNTER — Encounter (HOSPITAL_COMMUNITY): Payer: Self-pay | Admitting: Emergency Medicine

## 2020-06-01 ENCOUNTER — Other Ambulatory Visit: Payer: Self-pay

## 2020-06-01 ENCOUNTER — Emergency Department (HOSPITAL_COMMUNITY)
Admission: EM | Admit: 2020-06-01 | Discharge: 2020-06-02 | Disposition: A | Discharge status: Home / Self Care | Payer: Medicaid Other | Attending: Emergency Medicine | Admitting: Emergency Medicine

## 2020-06-01 DIAGNOSIS — F17210 Nicotine dependence, cigarettes, uncomplicated: Secondary | ICD-10-CM | POA: Diagnosis not present

## 2020-06-01 DIAGNOSIS — L0501 Pilonidal cyst with abscess: Secondary | ICD-10-CM | POA: Diagnosis not present

## 2020-06-01 DIAGNOSIS — Z7982 Long term (current) use of aspirin: Secondary | ICD-10-CM | POA: Insufficient documentation

## 2020-06-01 NOTE — ED Triage Notes (Signed)
Pt reports a recurrent abscess on his bottom for the past year, scheduled for surgery 6/26, pt reports it is bigger than ever and is in 10/10 pain. Pt reports he attempted to pop it himself but reports no relief because the abscess is deep.

## 2020-06-02 MED ORDER — LIDOCAINE HCL (PF) 1 % IJ SOLN
5.0000 mL | Freq: Once | INTRAMUSCULAR | Status: AC
  Administered 2020-06-02: 5 mL via INTRADERMAL
  Filled 2020-06-02: qty 5

## 2020-06-02 MED ORDER — CLINDAMYCIN HCL 150 MG PO CAPS: 300.0000 mg | ORAL_CAPSULE | Freq: Three times a day (TID) | ORAL | 0 refills | Status: DC

## 2020-06-02 MED ORDER — OXYCODONE-ACETAMINOPHEN 5-325 MG PO TABS
2.0000 | ORAL_TABLET | Freq: Once | ORAL | Status: AC
  Administered 2020-06-02: 2 via ORAL
  Filled 2020-06-02: qty 2

## 2020-06-02 MED ORDER — LIDOCAINE-EPINEPHRINE-TETRACAINE (LET) TOPICAL GEL
3.0000 mL | Freq: Once | TOPICAL | Status: AC
  Administered 2020-06-02: 3 mL via TOPICAL
  Filled 2020-06-02: qty 3

## 2020-06-02 NOTE — ED Notes (Signed)
All appropriate discharge materials reviewed at length with patient. Time for questions provided. Pt has no other questions at this time and verbalizes understanding of all provided materials.  

## 2020-06-02 NOTE — Discharge Instructions (Addendum)
Would continue warm compresses to the area a few times a day to help with drainage. Continue daily wound care/dressing change.s Follow-up with the surgery office as scheduled. Return here for new concerns.

## 2020-06-02 NOTE — ED Provider Notes (Signed)
[facility] EMERGENCY DEPARTMENT Provider Note   CSN: [id number] Arrival date & time: 06/01/20  2148     History Chief Complaint  Patient presents with  . Abscess    Roger Woods is a 50 y.o. male.  The history is provided by the patient and medical records.  Abscess   50 y.o. M with hx of chronic pain, headaches, schizophrenia, presenting to the ED with recurrent abscess of left buttock.  States it popped back up a few days ago but states worse than before.  He denies drainage, fever, chills, sweats.  States difficulty sitting on bottom due to pain, has been lying mostly on his stomach.  He is scheduled for definitive surgery with CSS on 07/03/20.  Past Medical History:  Diagnosis Date  . Chronic pain   . Headache(784.0)   . Hemorrhoids   . Insomnia due to medical condition   . Psychosis (HCC)   . Schizophrenia (HCC)   . Weakness    occasionally in both arms and hands;related to neck issues    Patient Active Problem List   Diagnosis Date Noted  . Pseudoarthrosis of cervical spine (HCC) 06/24/2013  . Hypersomnia with sleep apnea, unspecified 04/24/2013  . Narcotic drug use 04/24/2013  . 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 HARDWARE REMOVAL N/A 06/24/2013   Procedure: EXPLORATION/HARDWARE REMOVAL CERVICAL FOUR-FIVE, CERVICAL FIVE-SIX, AND CERVICAL SIX-SEVEN.;  Surgeon: Mariam Dollar, MD;  Location: [facility];  Service: Neurosurgery;  Laterality: N/A;  posteriorpt. states hardware was not removed  . POSTERIOR CERVICAL FUSION/FORAMINOTOMY N/A 11/19/2012   Procedure: CERVICAL TWO TO CERVICAL SEVEN POSTERIOR CERVICAL FUSION/FORAMINOTOMY LEVEL 5;  Surgeon: Mariam Dollar, MD;  Location: [facility];  Service: Neurosurgery;  Laterality: N/A;  C2-7 posteior cervical arthrodesis with instrumentation  . SHOULDER ARTHROSCOPY WITH ROTATOR CUFF REPAIR AND SUBACROMIAL DECOMPRESSION Left 04/20/2020    Procedure: SHOULDER ARTHROSCOPY WITH MINI OPEN  ROTATOR CUFF REPAIR AND SUBACROMIAL DECOMPRESSION;  Surgeon: Jene Every, MD;  Location: [facility];  Service: Orthopedics;  Laterality: Left;  90 MINS       Family History  Problem Relation Age of Onset  . High blood pressure Maternal Grandmother   . Cancer Other     Social History   Tobacco Use  . Smoking status: Current Every Day Smoker    Packs/day: 1.00    Years: 7.00    Pack years: 7.00    Types: Cigarettes  . Smokeless tobacco: Never Used  Vaping Use  . Vaping Use: Never used  Substance Use Topics  . Alcohol use: Not Currently  . Drug use: No    Home Medications Prior to Admission medications   Medication Sig Start Date End Date Taking? Authorizing Provider  acetaminophen (TYLENOL) 500 MG tablet Take 1,000 mg by mouth every 6 (six) hours as needed.    [provider]  aspirin EC 81 MG tablet Take 1 tablet (81 mg total) by mouth 2 (two) times daily after a meal. Day after surgery 04/20/20   Jene Every, MD  baclofen (LIORESAL) 10 MG tablet Take 10 mg by mouth 3 (three) times daily as needed for muscle spasms.    [provider]  Cholecalciferol (VITAMIN D) 50 MCG (2000 UT) tablet Take 2,000 Units by mouth daily.    [provider]  diclofenac Sodium (VOLTAREN) 1 % GEL Apply 2 g topically 4 (four) times daily. Patient not taking: No sig  reported 01/03/20   Joy, Ines Bloomer C, PA-C  docusate sodium (COLACE) 100 MG capsule Take 1 capsule (100 mg total) by mouth 2 (two) times daily as needed for mild constipation. 04/20/20   Jene Every, MD  gabapentin (NEURONTIN) 300 MG capsule Take 300 mg by mouth 3 (three) times daily as needed (pain).    [provider]  methocarbamol (ROBAXIN) 750 MG tablet Take 1 tablet (750 mg total) by mouth 2 (two) times daily as needed for muscle spasms. Patient not taking: No sig reported 01/03/20   Joy, Shawn C, PA-C  naloxone [facility]) nasal spray 4 mg/0.1 mL  Place 1 spray into the nose as needed (opioid overdose).    [provider]  oxyCODONE (OXY IR/ROXICODONE) 5 MG immediate release tablet Take 1-2 tablets (5-10 mg total) by mouth 6 (six) times daily. 04/20/20   Jene Every, MD  polyethylene glycol (MIRALAX / GLYCOLAX) 17 g packet Take 17 g by mouth daily. 04/20/20   Jene Every, MD  vitamin B-12 (CYANOCOBALAMIN) 1000 MCG tablet Take 1,000 mcg by mouth daily.    [provider]  vitamin C (ASCORBIC ACID) 500 MG tablet Take 500 mg by mouth daily.    [provider]  Vitamin D, Ergocalciferol, (DRISDOL) 50000 units CAPS capsule Take 50,000 Units by mouth once a week. 01/04/15   [provider]    Allergies    Hydrocodone and Penicillins  Review of Systems   Review of Systems  Skin:       abscess  All other systems reviewed and are negative.   Physical Exam Updated Vital Signs BP 125/70   Pulse 96   Temp 99.8 F (37.7 C) (Oral)   Resp 18   Ht 5\' 9"  (1.753 m)   Wt 113.4 kg   SpO2 98%   BMI 36.92 kg/m   Physical Exam Vitals and nursing note reviewed.  Constitutional:      Appearance: He is well-developed.  HENT:     Head: Normocephalic and atraumatic.  Eyes:     Conjunctiva/sclera: Conjunctivae normal.     Pupils: Pupils are equal, round, and reactive to light.  Cardiovascular:     Rate and Rhythm: Normal rate and regular rhythm.     Heart sounds: Normal heart sounds.  Pulmonary:     Effort: Pulmonary effort is normal.     Breath sounds: Normal breath sounds.  Abdominal:     General: Bowel sounds are normal.     Palpations: Abdomen is soft.  Genitourinary:    Comments: Abscess present to left gluteal cleft, there is central fluctuance without active drainage, no tissue crepitus, no significant erythema/induration of the buttocks/back Musculoskeletal:        General: Normal range of motion.     Cervical back: Normal range of motion.  Skin:    General: Skin is warm and dry.   Neurological:     Mental Status: He is alert and oriented to person, place, and time.     ED Results / Procedures / Treatments   Labs (all labs ordered are listed, but only abnormal results are displayed) Labs Reviewed - No data to display  EKG None  Radiology No results found.  Procedures Procedures   INCISION AND DRAINAGE Performed by: Consent: Verbal consent obtained. Risks and benefits: risks, benefits and alternatives were discussed Type: abscess  Body area: left gluteal cleft  Anesthesia: topical and local infiltration  Incision was made with a scalpel.  Local anesthetic: LET  followed by lidocaine 1% without  Anesthetic total: 3 and 5 ml  Complexity: complex Blunt dissection to break up loculations  Drainage: purulent  Drainage amount: large  Packing material: none  Patient tolerance: Patient tolerated the procedure well with no immediate complications.   Medications Ordered in ED Medications  lidocaine-EPINEPHrine-tetracaine (LET) topical gel (3 mLs Topical Given 06/02/20 0032)  oxyCODONE-acetaminophen (PERCOCET/ROXICET) 5-325 MG per tablet 2 tablet (2 tablets Oral Given 06/02/20 0031)  lidocaine (PF) (XYLOCAINE) 1 % injection 5 mL (5 mLs Intradermal Given 06/02/20 0033)    ED Course  I have reviewed the triage vital signs and the nursing notes.  Pertinent labs & imaging results that were available during my care of the patient were reviewed by me and considered in my medical decision making (see chart for details).    MDM Rules/Calculators/A&P  50 year old male here with recurrent left sided pilonidal abscess.  He is due for definitive surgery with CTS on 07/03/2020.  He denies any fevers or active drainage.  He is afebrile and nontoxic in appearance here.  Does have significant abscess noted to left gluteal cleft, central fluctuance without active drainage.  There is no tissue crepitus or significant erythema of the buttocks/back.   No signs/symptoms suggestive of sepsis at this time and no exam findings concerning for necrotizing infection.  Will perform I&D.  1:11 AM Extremely large amount of foul smelling pus drained from abscess until only blood returned.  Patient tolerated well and reports improvement of symptoms.  Will plan to d/c home with course of abx, warm compresses.  Can follow-up with general surgery as scheduled.  May return here for new concerns.  Final Clinical Impression(s) / ED Diagnoses Final diagnoses:  Pilonidal abscess    Rx / DC Orders ED Discharge Orders         Ordered    clindamycin (CLEOCIN) 150 MG capsule  3 times daily        06/02/20 0116           Garlon Hatchet, PA-C 06/02/20 Gerrit Friends, April, MD 06/02/20 [phone]

## 2021-02-26 ENCOUNTER — Encounter (HOSPITAL_COMMUNITY): Payer: Self-pay

## 2021-02-26 ENCOUNTER — Other Ambulatory Visit: Payer: Self-pay

## 2021-02-26 ENCOUNTER — Emergency Department (HOSPITAL_COMMUNITY): Payer: Medicaid Other

## 2021-02-26 ENCOUNTER — Emergency Department (HOSPITAL_COMMUNITY)
Admission: EM | Admit: 2021-02-26 | Discharge: 2021-02-26 | Disposition: A | Discharge status: Home / Self Care | Payer: Medicaid Other | Attending: Emergency Medicine | Admitting: Emergency Medicine

## 2021-02-26 DIAGNOSIS — Y92410 Unspecified street and highway as the place of occurrence of the external cause: Secondary | ICD-10-CM | POA: Insufficient documentation

## 2021-02-26 DIAGNOSIS — Z20822 Contact with and (suspected) exposure to covid-19: Secondary | ICD-10-CM | POA: Diagnosis not present

## 2021-02-26 DIAGNOSIS — T1490XA Injury, unspecified, initial encounter: Secondary | ICD-10-CM

## 2021-02-26 DIAGNOSIS — M79671 Pain in right foot: Secondary | ICD-10-CM | POA: Diagnosis present

## 2021-02-26 DIAGNOSIS — Z23 Encounter for immunization: Secondary | ICD-10-CM | POA: Insufficient documentation

## 2021-02-26 DIAGNOSIS — M533 Sacrococcygeal disorders, not elsewhere classified: Secondary | ICD-10-CM | POA: Insufficient documentation

## 2021-02-26 DIAGNOSIS — Z7982 Long term (current) use of aspirin: Secondary | ICD-10-CM | POA: Insufficient documentation

## 2021-02-26 DIAGNOSIS — Z79899 Other long term (current) drug therapy: Secondary | ICD-10-CM | POA: Diagnosis not present

## 2021-02-26 LAB — COMPREHENSIVE METABOLIC PANEL
ALT: 30 U/L (ref 0–44)
AST: 30 U/L (ref 15–41)
Albumin: 4.2 g/dL (ref 3.5–5.0)
Alkaline Phosphatase: 62 U/L (ref 38–126)
Anion gap: 11 (ref 5–15)
BUN: 7 mg/dL (ref 6–20)
CO2: 22 mmol/L (ref 22–32)
Calcium: 9.5 mg/dL (ref 8.9–10.3)
Chloride: 103 mmol/L (ref 98–111)
Creatinine, Ser: 0.99 mg/dL (ref 0.61–1.24)
GFR, Estimated: >60 mL/min (ref >60)
Glucose, Bld: 162 mg/dL — ABNORMAL HIGH (ref 70–99)
Potassium: 4.3 mmol/L (ref 3.5–5.1)
Sodium: 136 mmol/L (ref 135–145)
Total Bilirubin: 0.6 mg/dL (ref 0.3–1.2)
Total Protein: 7.6 g/dL (ref 6.5–8.1)

## 2021-02-26 LAB — I-STAT CHEM 8, ED
BUN: 7 mg/dL (ref 6–20)
Calcium, Ion: 1.18 mmol/L (ref 1.15–1.40)
Chloride: 104 mmol/L (ref 98–111)
Creatinine, Ser: 0.9 mg/dL (ref 0.61–1.24)
Glucose, Bld: 160 mg/dL — ABNORMAL HIGH (ref 70–99)
HCT: 45 % (ref 39.0–52.0)
Hemoglobin: 15.3 g/dL (ref 13.0–17.0)
Potassium: 4.3 mmol/L (ref 3.5–5.1)
Sodium: 137 mmol/L (ref 135–145)
TCO2: 23 mmol/L (ref 22–32)

## 2021-02-26 LAB — CBC
HCT: 43.9 % (ref 39.0–52.0)
Hemoglobin: 14.6 g/dL (ref 13.0–17.0)
MCH: 28.8 pg (ref 26.0–34.0)
MCHC: 33.3 g/dL (ref 30.0–36.0)
MCV: 86.6 fL (ref 80.0–100.0)
Platelets: 251 10*3/uL (ref 150–400)
RBC: 5.07 MIL/uL (ref 4.22–5.81)
RDW: 14.4 % (ref 11.5–15.5)
WBC: 6.7 10*3/uL (ref 4.0–10.5)
nRBC: 0 % (ref 0.0–0.2)

## 2021-02-26 LAB — LACTIC ACID, PLASMA: Lactic Acid, Venous: 2.7 mmol/L (ref 0.5–1.9)

## 2021-02-26 LAB — PROTIME-INR
INR: 1 (ref 0.8–1.2)
Prothrombin Time: 13.5 seconds (ref 11.4–15.2)

## 2021-02-26 LAB — ETHANOL: Alcohol, Ethyl (B): <10 mg/dL (ref <10)

## 2021-02-26 LAB — RESP PANEL BY RT-PCR (FLU A&B, COVID) ARPGX2
Influenza A by PCR: NEGATIVE
Influenza B by PCR: NEGATIVE
SARS Coronavirus 2 by RT PCR: NEGATIVE

## 2021-02-26 LAB — SAMPLE TO BLOOD BANK

## 2021-02-26 MED ORDER — TETANUS-DIPHTH-ACELL PERTUSSIS 5-2.5-18.5 LF-MCG/0.5 IM SUSY
0.5000 mL | PREFILLED_SYRINGE | Freq: Once | INTRAMUSCULAR | Status: AC
  Administered 2021-02-26: 0.5 mL via INTRAMUSCULAR
  Filled 2021-02-26: qty 0.5

## 2021-02-26 MED ORDER — MORPHINE SULFATE (PF) 4 MG/ML IV SOLN
6.0000 mg | Freq: Once | INTRAVENOUS | Status: AC
  Administered 2021-02-26: 6 mg via INTRAVENOUS
  Filled 2021-02-26: qty 2

## 2021-02-26 MED ORDER — FENTANYL CITRATE PF 50 MCG/ML IJ SOSY
50.0000 ug | PREFILLED_SYRINGE | Freq: Once | INTRAMUSCULAR | Status: AC
  Administered 2021-02-26: 50 ug via INTRAVENOUS
  Filled 2021-02-26: qty 1

## 2021-02-26 NOTE — ED Notes (Signed)
Patient transported to CT by this nurse.
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
## 2021-02-26 NOTE — ED Notes (Signed)
MD notified that patient lactic 2.7, no further action needed
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
## 2021-03-04 ENCOUNTER — Encounter (HOSPITAL_COMMUNITY): Payer: Self-pay | Admitting: Emergency Medicine

## 2021-03-04 ENCOUNTER — Emergency Department (HOSPITAL_COMMUNITY)
Admission: EM | Admit: 2021-03-04 | Discharge: 2021-03-04 | Disposition: A | Discharge status: Home / Self Care | Payer: Medicaid Other | Attending: Emergency Medicine | Admitting: Emergency Medicine

## 2021-03-04 ENCOUNTER — Other Ambulatory Visit: Payer: Self-pay

## 2021-03-04 DIAGNOSIS — L0591 Pilonidal cyst without abscess: Secondary | ICD-10-CM | POA: Diagnosis present

## 2021-03-04 DIAGNOSIS — Z7982 Long term (current) use of aspirin: Secondary | ICD-10-CM | POA: Insufficient documentation

## 2021-03-04 MED ORDER — OXYCODONE-ACETAMINOPHEN 5-325 MG PO TABS
1.0000 | ORAL_TABLET | Freq: Once | ORAL | Status: AC
  Administered 2021-03-04: 1 via ORAL
  Filled 2021-03-04: qty 1

## 2021-03-04 MED ORDER — SULFAMETHOXAZOLE-TRIMETHOPRIM 800-160 MG PO TABS
1.0000 | ORAL_TABLET | Freq: Two times a day (BID) | ORAL | 0 refills | Status: AC
Start: 2021-03-04 — End: 2021-03-11

## 2021-03-04 NOTE — ED Notes (Signed)
Pt A&O4 ambulatory at d/c with independent steady gait. Pt verbalized understanding of d/c instructions, prescription and follow up care.
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
## 2021-03-04 NOTE — Discharge Instructions (Addendum)
Today there was no evidence of active abscess at the site of your pilonidal cyst.  We also performed a bedside ultrasound without evidence of an abscess.  We will give you antibiotics to take.  I have also included follow-up information for [facility] surgery office.  This is a surgery group he followed up previously.  Please follow-up with them for discussion of further management.  If you start on antibiotics and note worsening of your symptoms and swelling please return to the emergency room.  You can also apply warm compress to this area 2-3 times a day.
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
## 2021-03-10 ENCOUNTER — Other Ambulatory Visit: Payer: Self-pay

## 2021-03-10 ENCOUNTER — Encounter (HOSPITAL_COMMUNITY): Payer: Self-pay | Admitting: *Deleted

## 2021-03-10 ENCOUNTER — Emergency Department (HOSPITAL_COMMUNITY)
Admission: EM | Admit: 2021-03-10 | Discharge: 2021-03-10 | Disposition: A | Discharge status: Home / Self Care | Payer: Medicaid Other | Attending: Emergency Medicine | Admitting: Emergency Medicine

## 2021-03-10 DIAGNOSIS — L0501 Pilonidal cyst with abscess: Secondary | ICD-10-CM

## 2021-03-10 DIAGNOSIS — Z7982 Long term (current) use of aspirin: Secondary | ICD-10-CM | POA: Diagnosis not present

## 2021-03-10 DIAGNOSIS — L0291 Cutaneous abscess, unspecified: Secondary | ICD-10-CM

## 2021-03-10 MED ORDER — KETOROLAC TROMETHAMINE 60 MG/2ML IM SOLN
30.0000 mg | Freq: Once | INTRAMUSCULAR | Status: AC
Start: 2021-03-10 — End: 2021-03-10
  Administered 2021-03-10: 30 mg via INTRAMUSCULAR
  Filled 2021-03-10: qty 2

## 2021-03-10 MED ORDER — LIDOCAINE HCL (PF) 1 % IJ SOLN
10.0000 mL | Freq: Once | INTRAMUSCULAR | Status: DC
  Filled 2021-03-10: qty 10

## 2021-03-10 NOTE — Discharge Instructions (Signed)
No repeat antibiotics at this time. Please follow up with general surgery.  ?

## 2021-03-10 NOTE — ED Provider Notes (Incomplete)
Patient is a 51 year old male presenting today with an uncomplicated pilonidal cyst abscess.  He has no systemic symptoms and is otherwise well-appearing.  I was present for I&D with significant amount of purulent drainage.  Patient is already currently on antibiotic.  Feel that patient would benefit from surgical referral as this is recurrent

## 2021-03-10 NOTE — ED Triage Notes (Signed)
Pt was seen on 2/25 for lower back abscess. Reports it now has a large swollen area that needs to be drained. Finishing his week of antibiotics today. Denies fever. No acute distress is noted.  ?

## 2021-03-10 NOTE — ED Provider Notes (Signed)
[facility] EMERGENCY DEPARTMENT  Provider Note  CSN: [id number] Arrival date & time: 03/10/21 0854  History Chief Complaint  Patient presents with   Abscess    Roger Woods is a 51 y.o. male who presents today with swelling to his buttock.  Patient with history of pilonidal cyst.  He was recently seen for swelling in the same area.  There was no drainable collection at that time.  He was sent home on antibiotics.  He returns today with worsening symptoms.  He has had increasing pain and swelling in the area. Rates pain as severe. No drainage.   Home Medications Prior to Admission medications   Medication Sig Start Date End Date Taking? Authorizing Provider  acetaminophen (TYLENOL) 500 MG tablet Take 1,000 mg by mouth every 6 (six) hours as needed.    [provider]  aspirin EC 81 MG tablet Take 1 tablet (81 mg total) by mouth 2 (two) times daily after a meal. Day after surgery 04/20/20   Jene Every, MD  baclofen (LIORESAL) 10 MG tablet Take 10 mg by mouth 3 (three) times daily as needed for muscle spasms.    [provider]  Cholecalciferol (VITAMIN D) 50 MCG (2000 UT) tablet Take 2,000 Units by mouth daily.    [provider]  clindamycin (CLEOCIN) 150 MG capsule Take 2 capsules (300 mg total) by mouth 3 (three) times daily. May dispense as 150mg  capsules 06/02/20   06/04/20, PA-C  diclofenac Sodium (VOLTAREN) 1 % GEL Apply 2 g topically 4 (four) times daily. Patient not taking: No sig reported 01/03/20   Joy, Shawn C, PA-C  docusate sodium (COLACE) 100 MG capsule Take 1 capsule (100 mg total) by mouth 2 (two) times daily as needed for mild constipation. 04/20/20   04/22/20, MD  gabapentin (NEURONTIN) 300 MG capsule Take 300 mg by mouth 3 (three) times daily as needed (pain).    [provider]  methocarbamol (ROBAXIN) 750 MG tablet Take 1 tablet (750 mg total) by mouth 2 (two) times daily as needed for muscle  spasms. Patient not taking: No sig reported 01/03/20   Joy, Shawn C, PA-C  naloxone [facility]) nasal spray 4 mg/0.1 mL Place 1 spray into the nose as needed (opioid overdose).    [provider]  oxyCODONE (OXY IR/ROXICODONE) 5 MG immediate release tablet Take 1-2 tablets (5-10 mg total) by mouth 6 (six) times daily. 04/20/20   04/22/20, MD  polyethylene glycol (MIRALAX / GLYCOLAX) 17 g packet Take 17 g by mouth daily. 04/20/20   04/22/20, MD  sulfamethoxazole-trimethoprim (BACTRIM DS) 800-160 MG tablet Take 1 tablet by mouth 2 (two) times daily for 7 days. 03/04/21 03/11/21  05/11/21, PA-C  vitamin B-12 (CYANOCOBALAMIN) 1000 MCG tablet Take 1,000 mcg by mouth daily.    [provider]  vitamin C (ASCORBIC ACID) 500 MG tablet Take 500 mg by mouth daily.    [provider]  Vitamin D, Ergocalciferol, (DRISDOL) 50000 units CAPS capsule Take 50,000 Units by mouth once a week. 01/04/15   [provider]     Allergies    Hydrocodone and Penicillins   Review of Systems   Review of Systems  Constitutional:  Negative for chills and fever.  HENT:  Negative for ear pain and sore throat.   Eyes:  Negative for pain and visual disturbance.  Respiratory:  Negative for cough and shortness of breath.   Cardiovascular:  Negative for chest  pain and palpitations.  Gastrointestinal:  Negative for abdominal pain and vomiting.  Genitourinary:  Negative for dysuria and hematuria.  Musculoskeletal:  Negative for arthralgias and back pain.  Skin:  Positive for wound. Negative for color change and rash.  Neurological:  Negative for seizures and syncope.  All other systems reviewed and are negative. Please see HPI for pertinent positives and negatives  Physical Exam BP 107/72    Pulse 65    Temp 99 F (37.2 C) (Oral)    Resp 17    SpO2 99%   Physical Exam Vitals and nursing note reviewed.  Constitutional:      General: He is not in acute distress.    Appearance:  He is well-developed.  HENT:     Head: Normocephalic and atraumatic.  Eyes:     Conjunctiva/sclera: Conjunctivae normal.  Cardiovascular:     Rate and Rhythm: Normal rate and regular rhythm.     Heart sounds: No murmur heard. Pulmonary:     Effort: Pulmonary effort is normal. No respiratory distress.     Breath sounds: Normal breath sounds.  Abdominal:     Palpations: Abdomen is soft.     Tenderness: There is no abdominal tenderness.  Musculoskeletal:        General: No swelling.     Cervical back: Neck supple.  Skin:    General: Skin is warm and dry.     Capillary Refill: Capillary refill takes less than 2 seconds.     Comments: Large swollen area to left gluteal cleft. Extensive surrounding induration. Middle of the swelling with pale and discolored skin that is fluctuant, consistent with pus under skin.  Neurological:     Mental Status: He is alert.  Psychiatric:        Mood and Affect: Mood normal.    ED Results / Procedures / Treatments   EKG None  Procedures .Marland KitchenIncision and Drainage  Date/Time: 03/10/2021 5:35 PM Performed by: Edison Simon, MD Authorized by: Gwyneth Sprout, MD   Consent:    Consent obtained:  Verbal   Consent given by:  Patient   Risks, benefits, and alternatives were discussed: yes     Risks discussed:  Bleeding, incomplete drainage, damage to other organs and infection   Alternatives discussed:  Alternative treatment Universal protocol:    Patient identity confirmed:  Verbally with patient Location:    Type:  Pilonidal cyst   Size:  3cm   Location:  Anogenital   Anogenital location:  Pilonidal Pre-procedure details:    Skin preparation:  Antiseptic wash Sedation:    Sedation type:  None Anesthesia:    Anesthesia method:  Local infiltration   Local anesthetic:  Lidocaine 1% w/o epi Procedure type:    Complexity:  Complex Procedure details:    Ultrasound guidance: no     Needle aspiration: no     Incision types:  Single  straight   Incision depth:  Dermal   Wound management:  Probed and deloculated and extensive cleaning   Drainage:  Purulent   Drainage amount:  Copious   Wound treatment:  Wound left open   Packing materials:  None Post-procedure details:    Procedure completion:  Tolerated  Medications Ordered in the ED Medications  lidocaine (PF) (XYLOCAINE) 1 % injection 10 mL (has no administration in time range)  ketorolac (TORADOL) injection 30 mg (30 mg Intramuscular Given 03/10/21 1520)     ED Course       MDM   This patient presents  to the ED for concern of abscess, this involves an extensive number of treatment options, and is a complaint that carries with it a high risk of complications and morbidity.  The differential diagnosis includes pilonidal cyst. Patients presentation is complicated by their history of pilonidal cysts  Additional history obtained: Additional history obtained from family Records reviewed Primary Care Documents  Medical Decision Making: Patient is here with a large pilonidal cyst to his left gluteal cleft.  It is exquisitely tender to the touch.  Extensive surrounding induration.  Minimal warmth or erythema noted.  No fever here.  Patient is nontachycardic.  He has been on antibiotics for the last 5 days.  Incision and drainage was performed as per above.  Patient tolerated the procedure well.  Copious amounts of purulent drainage was expressed.  Patient was feeling significantly better afterwards.  Patient will require outpatient follow-up with general surgery given the recurrent nature of his pilonidal cyst.  Discussed wound care at home.  No further antibiotics at this time.  Discussed tricked return precautions and the importance of close outpatient follow-up.  Patient and his family voiced understanding and agreement with this plan.  Complexity of problems addressed: Patients presentation is most consistent with  acute presentation with potential threat to life  or bodily function  Disposition: After consideration of the diagnostic results and the patients response to treatment,  I feel that the patent would benefit from discharge home .   Patient seen in conjunction with my attending, Dr. Anitra Lauth.    Final Clinical Impression(s) / ED Diagnoses Final diagnoses:  Abscess  Pilonidal abscess    Rx / DC Orders ED Discharge Orders     None         Edison Simon, MD 03/10/21 1739    Gwyneth Sprout, MD 03/11/21 0003

## 2021-03-10 NOTE — ED Notes (Signed)
Pt has abscess on right lower back near buttocks appro the size of a golf ball. No drainage or opening noted ?

## 2021-03-10 NOTE — ED Provider Triage Note (Signed)
Emergency Medicine Provider Triage Evaluation Note ? ?Roger Woods , a 51 y.o. male  was evaluated in triage.  Pt complains of abscess.  Patient had an I&D for an abscess on his left buttocks on 2/25.  It helped him and he has 1 more day of antibiotics however over the past 1 to 2 days he has noted the abscess starting to come back.  Endorses some chills.  No objective fevers ? ?Review of Systems  ?Positive: See above ?Negative: See above ? ?Physical Exam  ?BP 107/73 (BP Location: Left Arm)   Pulse 80   Temp 98.6 ?F (37 ?C) (Oral)   Resp 15   SpO2 98%  ?Gen:   Awake, no distress   ?Resp:  Normal effort  ?MSK:   Moves extremities without difficulty  ?Other:  Golfball sized abscess to the right buttocks, fluctuance ? ?Medical Decision Making  ?Medically screening exam initiated at 9:18 AM.  Appropriate orders placed.  HAI MONFILS was informed that the remainder of the evaluation will be completed by another provider, this initial triage assessment does not replace that evaluation, and the importance of remaining in the ED until their evaluation is complete. ? ?I&D, no hallway ?  ?Rhae Hammock, PA-C ?03/10/21 0920 ? ?

## 2021-04-18 ENCOUNTER — Emergency Department (HOSPITAL_COMMUNITY)
Admission: EM | Admit: 2021-04-18 | Discharge: 2021-04-19 | Disposition: A | Discharge status: Home / Self Care | Payer: Medicaid Other | Attending: Physician Assistant | Admitting: Physician Assistant

## 2021-04-18 ENCOUNTER — Other Ambulatory Visit: Payer: Self-pay

## 2021-04-18 ENCOUNTER — Encounter (HOSPITAL_COMMUNITY): Payer: Self-pay | Admitting: Emergency Medicine

## 2021-04-18 ENCOUNTER — Emergency Department (HOSPITAL_COMMUNITY): Payer: Medicaid Other

## 2021-04-18 DIAGNOSIS — Z5321 Procedure and treatment not carried out due to patient leaving prior to being seen by health care provider: Secondary | ICD-10-CM | POA: Diagnosis not present

## 2021-04-18 DIAGNOSIS — M25562 Pain in left knee: Secondary | ICD-10-CM | POA: Insufficient documentation

## 2021-04-18 DIAGNOSIS — W010XXA Fall on same level from slipping, tripping and stumbling without subsequent striking against object, initial encounter: Secondary | ICD-10-CM | POA: Insufficient documentation

## 2021-04-18 NOTE — ED Triage Notes (Signed)
Patient states that he slipped in water and landed on left knee.  He states that it is swollen and he is having a hard time walking on it and extending it.   ?

## 2021-04-18 NOTE — ED Provider Triage Note (Signed)
Emergency Medicine Provider Triage Evaluation Note ? ?Roger Woods , a 51 y.o. male  was evaluated in triage.  Pt complains of pain in the left knee. ?A states that he fell last night landing on the left knee.  He then fell again earlier today.  He denies any other injuries, did not strike his head.  He has crutches from a previous injury that he has been using.  He states he did take something for his pain prior to arrival but is not sure what it was. ? ? ?Physical Exam  ?BP 125/84 (BP Location: Left Arm)   Pulse 83   Temp 99.1 ?F (37.3 ?C) (Oral)   Resp 16   SpO2 97%  ?Gen:   Awake, no distress   ?Resp:  Normal effort  ?MSK:   Pain with range of motion of the left knee. ?Other:  2+ left DP/PT pulse.  Left foot is warm and well-perfused.  Sensation intact to light touch to left foot. ? ?Medical Decision Making  ?Medically screening exam initiated at 9:23 PM.  Appropriate orders placed.  Roger Woods was informed that the remainder of the evaluation will be completed by another provider, this initial triage assessment does not replace that evaluation, and the importance of remaining in the ED until their evaluation is complete. ? ? ?  ?Cristina Gong, PA-C ?04/18/21 2125 ? ?

## 2021-04-19 NOTE — ED Notes (Signed)
Patient states he is leaving d/t wait time 

## 2021-06-15 ENCOUNTER — Ambulatory Visit (HOSPITAL_COMMUNITY)
Admission: EM | Admit: 2021-06-15 | Discharge: 2021-06-15 | Disposition: A | Discharge status: Home / Self Care | Payer: Medicaid Other | Attending: Family Medicine | Admitting: Family Medicine

## 2021-06-15 ENCOUNTER — Encounter (HOSPITAL_COMMUNITY): Payer: Self-pay | Admitting: Emergency Medicine

## 2021-06-15 DIAGNOSIS — L988 Other specified disorders of the skin and subcutaneous tissue: Secondary | ICD-10-CM

## 2021-06-15 MED ORDER — KETOROLAC TROMETHAMINE 60 MG/2ML IM SOLN
60.0000 mg | Freq: Once | INTRAMUSCULAR | Status: AC
  Administered 2021-06-15: 60 mg via INTRAMUSCULAR

## 2021-06-15 MED ORDER — SULFAMETHOXAZOLE-TRIMETHOPRIM 800-160 MG PO TABS: 1.0000 | ORAL_TABLET | Freq: Two times a day (BID) | ORAL | 0 refills | Status: DC

## 2021-06-15 MED ORDER — KETOROLAC TROMETHAMINE 60 MG/2ML IM SOLN
INTRAMUSCULAR | Status: AC
  Filled 2021-06-15: qty 2

## 2021-06-15 NOTE — ED Provider Notes (Signed)
[facility]   [id number] 06/15/21 Arrival Time: 1418  ASSESSMENT & PLAN:  1. Pilonidal disease    No area I would be comfortable attempting I&D. Discussed. Afebrile. VSS.  Recommend:  Follow-up Information     Schedule an appointment as soon as possible for a visit  with Surgery, [place].   Specialty: General Surgery Contact information: [address] [place] [address] [phone]                Meds ordered this encounter  Medications   ketorolac (TORADOL) injection 60 mg   sulfamethoxazole-trimethoprim (BACTRIM DS) 800-160 MG tablet    Sig: Take 1 tablet by mouth 2 (two) times daily for 10 days.    Dispense:  20 tablet    Refill:  0   Reviewed expectations re: course of current medical issues. Questions answered. Outlined signs and symptoms indicating need for more acute intervention. Patient verbalized understanding. After Visit Summary given.   SUBJECTIVE:  Roger Woods is a 51 y.o. male who presents with h/o recurrent pilonidal cysts/infections. Feels pain in area past week or two. Afebrile. No areas of drainage/bleeding. "Just hurts". Has been recommended to seek surgical evaluation.   OBJECTIVE:  Vitals:   06/15/21 1434  BP: 122/70  Pulse: 83  Resp: 18  Temp: 98.6 F (37 C)  TempSrc: Oral  SpO2: 97%    General appearance: alert; no distress Lower back/buttock: with mild skin thickening of skin over superior gluteal cleft; without overlying erythema; no areas of fluctuance Psychological: alert and cooperative; normal mood and affect  Allergies  Allergen Reactions   Hydrocodone     Throat itching    Penicillins Other (See Comments)    Unknown reaction Has patient had a PCN reaction causing immediate rash, facial/tongue/throat swelling, SOB or lightheadedness with hypotension: Yes Has patient had a PCN reaction causing severe rash involving mucus membranes or skin necrosis: Yes Has patient had a PCN  reaction that required hospitalization No Has patient had a PCN reaction occurring within the last 10 years: No If all of the above answers are "NO", then may proceed with Cephalosporin use.     Past Medical History:  Diagnosis Date   Chronic pain    Headache(784.0)    Hemorrhoids    Insomnia due to medical condition    Psychosis (HCC)    Schizophrenia (HCC)    Weakness    occasionally in both arms and hands;related to neck issues   Social History   Socioeconomic History   Marital status: Divorced    Spouse name: Not on file   Number of children: 7   Years of education: GED   Highest education level: Not on file  Occupational History   Not on file  Tobacco Use   Smoking status: Every Day    Packs/day: 1.00    Years: 7.00    Total pack years: 7.00    Types: Cigarettes   Smokeless tobacco: Never  Vaping Use   Vaping Use: Never used  Substance and Sexual Activity   Alcohol use: Not Currently   Drug use: No   Sexual activity: Yes  Other Topics Concern   Not on file  Social History Narrative   Patient is single and lives alone.   Patient has 7 children.   Patient is disabled.   Patient has his GED.   Patient is right-handed.   Patient drinks some caffeine but not everyday.   Social Determinants of Health  Financial Resource Strain: Not on file  Food Insecurity: Not on file  Transportation Needs: Not on file  Physical Activity: Not on file  Stress: Not on file  Social Connections: Not on file   Family History  Problem Relation Age of Onset   High blood pressure Maternal Grandmother    Cancer Other    Past Surgical History:  Procedure Laterality Date   HARDWARE REMOVAL N/A 06/24/2013   Procedure: EXPLORATION/HARDWARE REMOVAL CERVICAL FOUR-FIVE, CERVICAL FIVE-SIX, AND CERVICAL SIX-SEVEN.;  Surgeon: Mariam Dollar, MD;  Location: [facility];  Service: Neurosurgery;  Laterality: N/A;  posteriorpt. states hardware was not removed   POSTERIOR CERVICAL  FUSION/FORAMINOTOMY N/A 11/19/2012   Procedure: CERVICAL TWO TO CERVICAL SEVEN POSTERIOR CERVICAL FUSION/FORAMINOTOMY LEVEL 5;  Surgeon: Mariam Dollar, MD;  Location: [facility];  Service: Neurosurgery;  Laterality: N/A;  C2-7 posteior cervical arthrodesis with instrumentation   SHOULDER ARTHROSCOPY WITH ROTATOR CUFF REPAIR AND SUBACROMIAL DECOMPRESSION Left 04/20/2020   Procedure: SHOULDER ARTHROSCOPY WITH MINI OPEN  ROTATOR CUFF REPAIR AND SUBACROMIAL DECOMPRESSION;  Surgeon: Jene Every, MD;  Location: [facility];  Service: Orthopedics;  Laterality: Left;  90 MINS            Mardella Layman, MD 06/15/21 1727

## 2021-06-15 NOTE — ED Triage Notes (Signed)
Pt reports has abscess that is going to have to have surgery to get head out of it but didn't have help after surgery. Reports causing back pains.

## 2021-06-15 NOTE — Discharge Instructions (Signed)
Meds ordered this encounter  Medications   ketorolac (TORADOL) injection 60 mg   sulfamethoxazole-trimethoprim (BACTRIM DS) 800-160 MG tablet    Sig: Take 1 tablet by mouth 2 (two) times daily for 10 days.    Dispense:  20 tablet    Refill:  0

## 2021-06-19 ENCOUNTER — Ambulatory Visit (HOSPITAL_COMMUNITY)
Admission: EM | Admit: 2021-06-19 | Discharge: 2021-06-19 | Disposition: A | Discharge status: Home / Self Care | Payer: Medicaid Other | Attending: Physician Assistant | Admitting: Physician Assistant

## 2021-06-19 ENCOUNTER — Encounter (HOSPITAL_COMMUNITY): Payer: Self-pay | Admitting: Emergency Medicine

## 2021-06-19 DIAGNOSIS — R001 Bradycardia, unspecified: Secondary | ICD-10-CM

## 2021-06-19 DIAGNOSIS — R55 Syncope and collapse: Secondary | ICD-10-CM

## 2021-06-19 DIAGNOSIS — R0602 Shortness of breath: Secondary | ICD-10-CM

## 2021-06-19 DIAGNOSIS — M7989 Other specified soft tissue disorders: Secondary | ICD-10-CM | POA: Diagnosis not present

## 2021-06-19 LAB — POCT URINALYSIS DIPSTICK, ED / UC
Bilirubin Urine: NEGATIVE
Glucose, UA: NEGATIVE mg/dL
Hgb urine dipstick: NEGATIVE
Ketones, ur: NEGATIVE mg/dL
Leukocytes,Ua: NEGATIVE
Nitrite: NEGATIVE
Protein, ur: NEGATIVE mg/dL
Specific Gravity, Urine: >=1.03 (ref 1.005–1.030)
Urobilinogen, UA: 2 mg/dL — ABNORMAL HIGH (ref 0.0–1.0)
pH: 6 (ref 5.0–8.0)

## 2021-06-19 LAB — CBG MONITORING, ED
Glucose-Capillary: 61 mg/dL — ABNORMAL LOW (ref 70–99)
Glucose-Capillary: 80 mg/dL (ref 70–99)

## 2021-06-19 MED ORDER — GLUCOSE 4 G PO CHEW
CHEWABLE_TABLET | ORAL | Status: AC
  Filled 2021-06-19: qty 4

## 2021-06-19 MED ORDER — GLUCOSE 4 G PO CHEW
4.0000 | CHEWABLE_TABLET | Freq: Once | ORAL | Status: AC
  Administered 2021-06-19: 16 g via ORAL

## 2021-06-19 NOTE — ED Triage Notes (Signed)
Pt is present today with bilateral swelling in his ankles and feet. Pt states that he believes it came from a bug bite. Pt states that he noticed the swelling x3 days ago.

## 2021-06-19 NOTE — Discharge Instructions (Signed)
As we discussed, the safest thing to do is to go to the emergency room.  Please go there as soon as possible for further evaluation and management.  If you develop any severe shortness of breath, chest pain, passing out you should stop and call 911.

## 2021-06-19 NOTE — ED Provider Notes (Signed)
[facility]    CSN: [id number] Arrival date & time: 06/19/21  1146      History   Chief Complaint Chief Complaint  Patient presents with   Foot Swelling    HPI Roger Woods is a 51 y.o. male.   Patient presents today with a several day history of bilateral leg swelling and pain.  He reports that he was seen here on 06/15/2021 at which point he was told that not all complaints could be addressed.  He decided to address boil which has since improved since being prescribed Bactrim.  He reports taking medication as prescribed without missing doses or noted side effects.  He reports that leg swelling and shortness of breath predate initiation of Bactrim.  He does report a near syncopal episode earlier and is unsure if he lost consciousness for any length of time.  He denies any chest pain but does report some shortness of breath.  Denies any fever, nausea, vomiting, weakness.  He reports extreme lower extremity pain due to severity of swelling.  He denies history of thyroid condition, heart failure, chronic liver/kidney disease.  Denies any recent calcium channel blocker use.  Denies any increased sodium consumption or dietary changes.      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 states hardware was not removed   POSTERIOR CERVICAL FUSION/FORAMINOTOMY N/A 11/19/2012   Procedure: CERVICAL TWO TO CERVICAL SEVEN POSTERIOR CERVICAL FUSION/FORAMINOTOMY LEVEL 5;  Surgeon: Mariam DollarGary P Cram, MD;  Location: [facility];  Service: Neurosurgery;  Laterality: N/A;  C2-7 posteior cervical arthrodesis with instrumentation   SHOULDER ARTHROSCOPY WITH ROTATOR CUFF REPAIR AND SUBACROMIAL DECOMPRESSION Left 04/20/2020   Procedure: SHOULDER ARTHROSCOPY WITH MINI OPEN  ROTATOR CUFF REPAIR AND SUBACROMIAL DECOMPRESSION;  Surgeon: Jene EveryBeane, Jeffrey, MD;  Location: [facility];  Service: Orthopedics;  Laterality: Left;  90 MINS       Home Medications    Prior to Admission medications   Medication Sig Start Date End Date Taking? Authorizing Provider  acetaminophen (TYLENOL) 500 MG tablet Take 1,000 mg by mouth every 6 (six) hours as needed.    [provider]  aspirin EC 81 MG tablet Take 1 tablet (81 mg total) by mouth 2 (two) times daily after a meal. Day after surgery 04/20/20   Jene EveryBeane, Jeffrey, MD  baclofen (LIORESAL) 10 MG tablet Take 10 mg by mouth 3 (three) times daily as needed for muscle spasms.    [provider]  Cholecalciferol (VITAMIN D) 50 MCG (2000 UT) tablet Take 2,000 Units by mouth daily.    [provider]  clindamycin (CLEOCIN) 150 MG capsule Take 2 capsules (300 mg total) by mouth 3 (three) times daily. May dispense as 150mg  capsules  06/02/20   Garlon Hatchet, PA-C  diclofenac Sodium (VOLTAREN) 1 % GEL Apply 2 g topically 4 (four) times daily. Patient not taking: No sig reported 01/03/20   Joy, Shawn C, PA-C  docusate sodium (COLACE) 100 MG capsule Take 1 capsule (100 mg total) by mouth 2 (two) times daily as needed for mild constipation. 04/20/20   Jene Every, MD  gabapentin (NEURONTIN) 300 MG capsule Take 300 mg by mouth 3 (three) times daily as needed (pain).    [provider]   methocarbamol (ROBAXIN) 750 MG tablet Take 1 tablet (750 mg total) by mouth 2 (two) times daily as needed for muscle spasms. Patient not taking: No sig reported 01/03/20   Joy, Shawn C, PA-C  naloxone [facility]) nasal spray 4 mg/0.1 mL Place 1 spray into the nose as needed (opioid overdose).    [provider]  polyethylene glycol (MIRALAX / GLYCOLAX) 17 g packet Take 17 g by mouth daily. 04/20/20   Jene Every, MD  vitamin B-12 (CYANOCOBALAMIN) 1000 MCG tablet Take 1,000 mcg by mouth daily.    [provider]  vitamin C (ASCORBIC ACID) 500 MG tablet Take 500 mg by mouth daily.    [provider]  Vitamin D, Ergocalciferol, (DRISDOL) 50000 units CAPS capsule Take 50,000 Units by mouth once a week. 01/04/15   [provider]    Family History Family History  Problem Relation Age of Onset   High blood pressure Maternal Grandmother    Cancer Other     Social History Social History   Tobacco Use   Smoking status: Every Day    Packs/day: 1.00    Years: 7.00    Total pack years: 7.00    Types: Cigarettes   Smokeless tobacco: Never  Vaping Use   Vaping Use: Never used  Substance Use Topics   Alcohol use: Not Currently   Drug use: No     Allergies   Hydrocodone and Penicillins   Review of Systems Review of Systems  Constitutional:  Positive for activity change. Negative for appetite change, fatigue and fever.  Respiratory:  Positive for shortness of breath. Negative for cough.   Cardiovascular:  Positive for leg swelling. Negative for chest pain and palpitations.  Gastrointestinal:  Negative for abdominal pain, diarrhea, nausea and vomiting.  Musculoskeletal:  Positive for myalgias. Negative for arthralgias.  Neurological:  Negative for dizziness, light-headedness and headaches.     Physical Exam Triage Vital Signs ED Triage Vitals  Enc Vitals Group     BP 06/19/21 1458 116/76     Pulse Rate 06/19/21 1458 (!) 55     Resp 06/19/21  1458 18     Temp 06/19/21 1458 (!) 97.3 F (36.3 C)     Temp src --      SpO2 06/19/21 1458 97 %     Weight --      Height --      Head Circumference --      Peak Flow --      Pain Score 06/19/21 1457 10     Pain Loc --      Pain Edu? --      Excl. in GC? --    No data found.  Updated Vital Signs BP 116/76   Pulse (!) 55   Temp (!) 97.3 F (36.3 C)   Resp 18   SpO2 97%   Visual Acuity Right Eye Distance:   Left Eye Distance:   Bilateral Distance:  Right Eye Near:   Left Eye Near:    Bilateral Near:     Physical Exam Vitals reviewed.  Constitutional:      General: He is awake.     Appearance: Normal appearance. He is well-developed. He is not ill-appearing.     Comments: Very pleasant male appears stated age no acute distress sitting in wheelchair  HENT:     Head: Normocephalic and atraumatic.     Mouth/Throat:     Pharynx: No oropharyngeal exudate, posterior oropharyngeal erythema or uvula swelling.  Cardiovascular:     Rate and Rhythm: Normal rate and regular rhythm.     Heart sounds: Normal heart sounds, S1 normal and S2 normal. No murmur heard.    Comments: 2+ pitting edema to knee bilaterally Pulmonary:     Effort: Pulmonary effort is normal.     Breath sounds: Normal breath sounds. No stridor. No wheezing, rhonchi or rales.     Comments: Clear to auscultation bilaterally Abdominal:     General: Bowel sounds are normal.     Palpations: Abdomen is soft.     Tenderness: There is no abdominal tenderness.  Musculoskeletal:     Right lower leg: 2+ Edema present.     Left lower leg: 2+ Edema present.  Neurological:     Mental Status: He is alert.  Psychiatric:        Behavior: Behavior is cooperative.      UC Treatments / Results  Labs (all labs ordered are listed, but only abnormal results are displayed) Labs Reviewed  POCT URINALYSIS DIPSTICK, ED / UC - Abnormal; Notable for the following components:      Result Value   Urobilinogen, UA 2.0  (*)    All other components within normal limits  CBG MONITORING, ED - Abnormal; Notable for the following components:   Glucose-Capillary 61 (*)    All other components within normal limits  CBC  COMPREHENSIVE METABOLIC PANEL  BRAIN NATRIURETIC PEPTIDE  TSH  T4, FREE  CBG MONITORING, ED    EKG   Radiology No results found.  Procedures Procedures (including critical care time)  Medications Ordered in UC Medications  glucose chewable tablet 16 g (16 g Oral Given 06/19/21 1541)    Initial Impression / Assessment and Plan / UC Course  I have reviewed the triage vital signs and the nursing notes.  Pertinent labs & imaging results that were available during my care of the patient were reviewed by me and considered in my medical decision making (see chart for details).     EKG was obtained that showed sinus bradycardia with ventricular rate of 50 bpm with T wave inversion in V2; compared to 11/13/2012 sinus bradycardia has replaced sinus rhythm with T wave inversion in V2.  Blood sugar was low at 61 and patient was given glucose tablets in clinic.  Recheck showed 80 and patient had some improvement of symptoms.  Discussed that since he is having symptomatic bradycardia he should go to the emergency room.  Initially he was hesitant and so tried to obtain blood work to initiate work-up in clinic including thyroid, BNP, CMP, CBC but nursing staff is unable to obtain specimen.  UA showed concentrated urine without proteinuria.  Discussed that the safest thing is to go to the emergency room but given his constellation of symptoms since we cannot rule out a concerning etiology.  Patient was ultimately agreeable and will go to emergency room for further evaluation and management.  Vital signs were  stable at the time of discharge.  Patient will have significant other transport him.  Final Clinical Impressions(s) / UC Diagnoses   Final diagnoses:  Leg swelling  Near syncope  Bradycardia   Shortness of breath     Discharge Instructions      As we discussed, the safest thing to do is to go to the emergency room.  Please go there as soon as possible for further evaluation and management.  If you develop any severe shortness of breath, chest pain, passing out you should stop and call 911.     ED Prescriptions   None    PDMP not reviewed this encounter.   Jeani Hawking, PA-C 06/19/21 1718

## 2021-06-25 ENCOUNTER — Emergency Department (HOSPITAL_COMMUNITY): Payer: Medicaid Other

## 2021-06-25 ENCOUNTER — Encounter (HOSPITAL_COMMUNITY): Payer: Self-pay

## 2021-06-25 ENCOUNTER — Other Ambulatory Visit: Payer: Self-pay

## 2021-06-25 ENCOUNTER — Emergency Department (HOSPITAL_COMMUNITY)
Admission: EM | Admit: 2021-06-25 | Discharge: 2021-06-25 | Disposition: A | Discharge status: Home / Self Care | Payer: Medicaid Other | Attending: Emergency Medicine | Admitting: Emergency Medicine

## 2021-06-25 DIAGNOSIS — R109 Unspecified abdominal pain: Secondary | ICD-10-CM | POA: Insufficient documentation

## 2021-06-25 DIAGNOSIS — R609 Edema, unspecified: Secondary | ICD-10-CM

## 2021-06-25 DIAGNOSIS — F17200 Nicotine dependence, unspecified, uncomplicated: Secondary | ICD-10-CM | POA: Diagnosis not present

## 2021-06-25 DIAGNOSIS — M7989 Other specified soft tissue disorders: Secondary | ICD-10-CM | POA: Diagnosis present

## 2021-06-25 DIAGNOSIS — R0602 Shortness of breath: Secondary | ICD-10-CM | POA: Insufficient documentation

## 2021-06-25 DIAGNOSIS — R600 Localized edema: Secondary | ICD-10-CM | POA: Insufficient documentation

## 2021-06-25 DIAGNOSIS — R140 Abdominal distension (gaseous): Secondary | ICD-10-CM | POA: Diagnosis not present

## 2021-06-25 LAB — COMPREHENSIVE METABOLIC PANEL
ALT: 18 U/L (ref 0–44)
AST: 17 U/L (ref 15–41)
Albumin: 4.3 g/dL (ref 3.5–5.0)
Alkaline Phosphatase: 51 U/L (ref 38–126)
Anion gap: 6 (ref 5–15)
BUN: 13 mg/dL (ref 6–20)
CO2: 27 mmol/L (ref 22–32)
Calcium: 9.3 mg/dL (ref 8.9–10.3)
Chloride: 105 mmol/L (ref 98–111)
Creatinine, Ser: 1.12 mg/dL (ref 0.61–1.24)
GFR, Estimated: >60 mL/min (ref >60)
Glucose, Bld: 133 mg/dL — ABNORMAL HIGH (ref 70–99)
Potassium: 3.9 mmol/L (ref 3.5–5.1)
Sodium: 138 mmol/L (ref 135–145)
Total Bilirubin: 0.4 mg/dL (ref 0.3–1.2)
Total Protein: 7.5 g/dL (ref 6.5–8.1)

## 2021-06-25 LAB — CBC
HCT: 43.7 % (ref 39.0–52.0)
Hemoglobin: 14.2 g/dL (ref 13.0–17.0)
MCH: 29.7 pg (ref 26.0–34.0)
MCHC: 32.5 g/dL (ref 30.0–36.0)
MCV: 91.4 fL (ref 80.0–100.0)
Platelets: 252 10*3/uL (ref 150–400)
RBC: 4.78 MIL/uL (ref 4.22–5.81)
RDW: 13.9 % (ref 11.5–15.5)
WBC: 7.5 10*3/uL (ref 4.0–10.5)
nRBC: 0 % (ref 0.0–0.2)

## 2021-06-25 LAB — LIPASE, BLOOD: Lipase: 26 U/L (ref 11–51)

## 2021-06-25 LAB — BRAIN NATRIURETIC PEPTIDE: B Natriuretic Peptide: 17.6 pg/mL (ref 0.0–100.0)

## 2021-06-25 MED ORDER — FUROSEMIDE 20 MG PO TABS
20.0000 mg | ORAL_TABLET | Freq: Every day | ORAL | 0 refills | Status: DC
Start: 2021-06-25 — End: 2021-07-08

## 2021-06-25 NOTE — ED Triage Notes (Signed)
Patient said he has noticed swelling in both of his legs and now his stomach for 2 weeks. No shortness of breath. Harder to walk now, using a cane.

## 2021-06-25 NOTE — Discharge Instructions (Signed)
You were seen in the emergency department today with leg swelling.  I am starting you on Lasix for the next several days which will help to reduce your swelling.  You need to see your primary care doctor for close follow-up.  More tests may need to be run to evaluate the cause of your swelling.  Please call to schedule this appointment.  Have also listed the name of a primary care doctor to call if you do not already have one.  Return to the emergency department any new or suddenly worsening symptoms.  When resting at home, please keep your legs elevated above the level of your heart.  You can also use compression stockings, available at the pharmacy, to help improve your leg swelling.

## 2021-06-25 NOTE — ED Provider Notes (Signed)
Emergency Department Provider Note   I have reviewed the triage vital signs and the nursing notes.   HISTORY  Chief Complaint Leg Swelling   HPI Roger Woods is a 51 y.o. male with past history reviewed below presents emergency department with abdominal distention and leg swelling.  Patient experiencing bilateral leg swelling tightness along with abdominal discomfort.  No focal abdominal pain, fevers or chills.  Is not feeling particularly short of breath.  Legs are becoming somewhat heavy and making it more difficult for him to walk.  Symptoms have been developing over the past 2 weeks.  He had a pilonidal abscess drained states from that standpoint he is feeling better.  He is finishing antibiotics. No history of CHF or kidney disease.  Patient was seen on 6/12 at the [facility] urgent care for similar symptoms.  He was advised to present to the emergency department at that time but he did not due to prior experience of [facility]edle wait times at [facility].   Past Medical History:  Diagnosis Date   Chronic pain    Headache(784.0)    Hemorrhoids    Insomnia due to medical condition    Psychosis (HCC)    Schizophrenia (HCC)    Weakness    occasionally in both arms and hands;related to neck issues    Review of Systems  Constitutional: No fever/chills Eyes: No visual changes. ENT: No sore throat. Cardiovascular: Denies chest pain. Positive B/L LE swelling.  Respiratory: Denies shortness of breath. Gastrointestinal: No abdominal pain.  No nausea, no vomiting.  No diarrhea.  No constipation. Positive abdominal distension.  Genitourinary: Negative for dysuria. Musculoskeletal: Negative for back pain. Skin: Negative for rash. Neurological: Negative for headaches, focal weakness or numbness.   ____________________________________________   PHYSICAL EXAM:  VITAL SIGNS: ED Triage Vitals  Enc Vitals Group     BP 06/25/21 0101 (!) 152/68     Pulse Rate 06/25/21 0101 73      Resp 06/25/21 0101 18     Temp 06/25/21 0101 98.5 F (36.9 C)     Temp Source 06/25/21 0101 Oral     SpO2 06/25/21 0101 99 %     Weight 06/25/21 0102 260 lb (117.9 kg)     Height 06/25/21 0102 5\' 9"  (1.753 m)   Constitutional: Alert and oriented. Well appearing and in no acute distress. Eyes: Conjunctivae are normal.  Head: Atraumatic. Nose: No congestion/rhinnorhea. Mouth/Throat: Mucous membranes are moist.   Neck: No stridor.   Cardiovascular: Normal rate, regular rhythm. Good peripheral circulation. Grossly normal heart sounds.   Respiratory: Normal respiratory effort.  No retractions. Lungs CTAB. Gastrointestinal: Soft and nontender. Positive distention.  Musculoskeletal: No lower extremity tenderness. 2+ pitting edema in the B/L LEs. No gross deformities of extremities. Neurologic:  Normal speech and language. No gross focal neurologic deficits are appreciated.  Skin:  Skin is warm, dry and intact. No rash noted.   ____________________________________________   LABS (all labs ordered are listed, but only abnormal results are displayed)  Labs Reviewed  COMPREHENSIVE METABOLIC PANEL - Abnormal; Notable for the following components:      Result Value   Glucose, Bld 133 (*)    All other components within normal limits  CBC  BRAIN NATRIURETIC PEPTIDE  LIPASE, BLOOD   ____________________________________________  EKG   EKG Interpretation  Date/Time:  Sunday June 25 2021 02:53:48 EDT Ventricular Rate:  67 PR Interval:  209 QRS Duration: 96 QT Interval:  401 QTC Calculation: 424 R  Axis:   48 Text Interpretation: Sinus rhythm Borderline prolonged PR interval Confirmed by Alona Bene [phone]) on 06/25/2021 4:07:13 AM        ____________________________________________  RADIOLOGY  DG Chest 2 View  Result Date: 06/25/2021 CLINICAL DATA:  Shortness of breath and bilateral lower extremity edema for 2 weeks. EXAM: CHEST - 2 VIEW COMPARISON:  02/26/2021 FINDINGS:  Normal heart size and pulmonary vascularity. No focal airspace disease or consolidation in the lungs. No blunting of costophrenic angles. No pneumothorax. Mediastinal contours appear intact. Postoperative changes in the cervical spine. IMPRESSION: No active cardiopulmonary disease. Electronically Signed   By: Burman Nieves M.D.   On: 06/25/2021 03:22    ____________________________________________   PROCEDURES  Procedure(s) performed:   Procedures  None  ____________________________________________   INITIAL IMPRESSION / ASSESSMENT AND PLAN / ED COURSE  Pertinent labs & imaging results that were available during my care of the patient were reviewed by me and considered in my medical decision making (see chart for details).   This patient is Presenting for Evaluation of leg swelling, which does require a range of treatment options, and is a complaint that involves a high risk of morbidity and mortality.  The Differential Diagnoses include CHF, AKI, venous insufficiency, liver disease/ascites.   I decided to review pertinent External Data, and in summary prior note from UC reviewed.   Clinical Laboratory Tests Ordered, included kidney function within normal limits.  BNP normal.  No electrolyte disturbance.  No anemia.  Radiologic Tests Ordered, included CXR. I independently interpreted the images and agree with radiology interpretation.   Cardiac Monitor Tracing which shows NSR.   Social Determinants of Health Risk patient is a smoker.    Medical Decision Making: Summary:  Patient presents emergency department with swelling in the feet/legs along with abdominal distention.  He is not hypoxic.  Chest x-ray without pulmonary edema.  Plan for screening blood work to assess kidney function, liver function.   Reevaluation with update and discussion with patient. Plan for leg elevation/compression along with 4 day course of lasix. Discussed need for very close PCP follow up and  discussed ED return precautions.   Considered admission but labs are reassuring. No pulmonary edema or kidney injury. Plan for d/c with close PCP follow up.    Disposition: discharge  ____________________________________________  FINAL CLINICAL IMPRESSION(S) / ED DIAGNOSES  Final diagnoses:  Peripheral edema     NEW OUTPATIENT MEDICATIONS STARTED DURING THIS VISIT:  New Prescriptions   FUROSEMIDE (LASIX) 20 MG TABLET    Take 1 tablet (20 mg total) by mouth daily for 4 days.    Note:  This document was prepared using Dragon voice recognition software and may include unintentional dictation errors.  Alona Bene, MD, [facility] Emergency Medicine    Brynne Doane, Arlyss Repress, MD 06/25/21 [phone]

## 2021-07-08 ENCOUNTER — Other Ambulatory Visit: Payer: Self-pay

## 2021-07-08 ENCOUNTER — Encounter (HOSPITAL_COMMUNITY): Payer: Medicaid Other

## 2021-07-08 ENCOUNTER — Emergency Department (HOSPITAL_COMMUNITY): Payer: Medicaid Other

## 2021-07-08 ENCOUNTER — Emergency Department (HOSPITAL_COMMUNITY)
Admission: EM | Admit: 2021-07-08 | Discharge: 2021-07-08 | Discharge status: Against Medical Advice | Payer: Medicaid Other | Attending: Emergency Medicine | Admitting: Emergency Medicine

## 2021-07-08 ENCOUNTER — Encounter (HOSPITAL_COMMUNITY): Payer: Self-pay

## 2021-07-08 DIAGNOSIS — Z7982 Long term (current) use of aspirin: Secondary | ICD-10-CM | POA: Insufficient documentation

## 2021-07-08 DIAGNOSIS — R7309 Other abnormal glucose: Secondary | ICD-10-CM | POA: Insufficient documentation

## 2021-07-08 DIAGNOSIS — M7989 Other specified soft tissue disorders: Secondary | ICD-10-CM

## 2021-07-08 DIAGNOSIS — Z760 Encounter for issue of repeat prescription: Secondary | ICD-10-CM | POA: Diagnosis present

## 2021-07-08 DIAGNOSIS — R600 Localized edema: Secondary | ICD-10-CM | POA: Insufficient documentation

## 2021-07-08 LAB — CBC WITH DIFFERENTIAL/PLATELET
Abs Immature Granulocytes: 0.02 10*3/uL (ref 0.00–0.07)
Basophils Absolute: 0 10*3/uL (ref 0.0–0.1)
Basophils Relative: 1 %
Eosinophils Absolute: 0.3 10*3/uL (ref 0.0–0.5)
Eosinophils Relative: 5 %
HCT: 45.4 % (ref 39.0–52.0)
Hemoglobin: 14.8 g/dL (ref 13.0–17.0)
Immature Granulocytes: 0 %
Lymphocytes Relative: 16 %
Lymphs Abs: 1 10*3/uL (ref 0.7–4.0)
MCH: 29.8 pg (ref 26.0–34.0)
MCHC: 32.6 g/dL (ref 30.0–36.0)
MCV: 91.5 fL (ref 80.0–100.0)
Monocytes Absolute: 0.5 10*3/uL (ref 0.1–1.0)
Monocytes Relative: 8 %
Neutro Abs: 4.3 10*3/uL (ref 1.7–7.7)
Neutrophils Relative %: 70 %
Platelets: 204 10*3/uL (ref 150–400)
RBC: 4.96 MIL/uL (ref 4.22–5.81)
RDW: 13.9 % (ref 11.5–15.5)
WBC: 6.1 10*3/uL (ref 4.0–10.5)
nRBC: 0 % (ref 0.0–0.2)

## 2021-07-08 LAB — TROPONIN I (HIGH SENSITIVITY): Troponin I (High Sensitivity): 2 ng/L (ref <18)

## 2021-07-08 LAB — BASIC METABOLIC PANEL
Anion gap: 6 (ref 5–15)
BUN: 13 mg/dL (ref 6–20)
CO2: 26 mmol/L (ref 22–32)
Calcium: 9.4 mg/dL (ref 8.9–10.3)
Chloride: 105 mmol/L (ref 98–111)
Creatinine, Ser: 0.88 mg/dL (ref 0.61–1.24)
GFR, Estimated: >60 mL/min (ref >60)
Glucose, Bld: 108 mg/dL — ABNORMAL HIGH (ref 70–99)
Potassium: 3.9 mmol/L (ref 3.5–5.1)
Sodium: 137 mmol/L (ref 135–145)

## 2021-07-08 LAB — BRAIN NATRIURETIC PEPTIDE: B Natriuretic Peptide: 27.2 pg/mL (ref 0.0–100.0)

## 2021-07-08 MED ORDER — FUROSEMIDE 20 MG PO TABS: 20.0000 mg | ORAL_TABLET | Freq: Every day | ORAL | 0 refills | Status: DC

## 2021-07-08 NOTE — Discharge Instructions (Signed)
I have refilled your Lasix for 7 days.  I provided you with a cardiology referral.  They should notify you for scheduling appointment, but if you not hear from them in 3 days, please call them to schedule an appointment. Turn if you have any worsening leg swelling, shortness of breath, or chest pain.

## 2021-07-08 NOTE — ED Provider Notes (Signed)
Accepted handoff at shift change from [place], [place]. Please see prior provider note for more detail.   Briefly: Patient is 51 y.o. male with no diagnosed CHF or HTN. He was seen several weeks ago with bilateral leg swelling. His workup was unremarkable with a normal BNP. He was discharged with Lasix. He presents today with continued bilateral leg swelling and has run out of Lasix. Only new complaint is him having intermittent chest discomfort over the past several weeks. No chest pain now.  DDX: concern for CHF  Plan: Repeat labs from prior workup and make sure they are stable. DVT study ordered. If all looks good, he can be discharged with lasix but will need Cardiology f/u.   Physical Exam  BP 114/72 (BP Location: Left Arm)   Pulse 67   Temp 98 F (36.7 C) (Oral)   Resp 16   Ht 5\' 9"  (1.753 m)   Wt 117.9 kg   SpO2 100%   BMI 38.40 kg/m   Physical Exam  Procedures  Procedures  ED Course / MDM      Medical Decision Making Amount and/or Complexity of Data Reviewed Labs: ordered. Radiology: ordered.  Risk Prescription drug management.   As notified by RN that patient wishes to leave AMA prior to getting DVT studies.  I reviewed his labs which reveal a normal CBC.  BMP has no evidence of AKI or any other abnormalities.  Troponins are negative.  Chest x-ray with no acute abnormalities.  EKG reveals possible early repolarization.  Normal sinus rhythm.  I do not feel this is ischemic in etiology.  We have a pending BNP.  I think it is okay to discharge him home with his Lasix for 7 days.  I have put in an ambulatory referral for cardiology.  He is given strict return precautions if worsening shortness of breath, lower leg swelling, or chest pain occurs. He will be discharged AMA.       , PA-C 07/08/21 09/08/21    1855, MD 07/09/21 [phone]

## 2021-07-08 NOTE — ED Provider Notes (Signed)
[facility]-EMERGENCY DEPT Provider Note   CSN: [id number] Arrival date & time: 07/08/21  0103     History  Chief Complaint  Patient presents with   Medication Refill    Roger Woods is a 51 y.o. male.  HPI  Medical history including schizophrenia, chronic pain, presents today needing a medication refill.  Patient states that he was given Lasix during his last ED visit for leg swelling, he states he has been taking it no status while he has improved but he still does affect would like more of them.  States that he was having leg swelling for last few weeks, he states that he feels pressure in his legs and it is hard to walk, he denies any calf tenderness, no long immobilizations, no recent surgeries, no history of PEs or DVTs, not on hormone therapy.  States that he has no history of CHF or hypertension, denies history of cirrhosis denies history of alcohol use, he endorsed that he feels some fluid in his lungs, felt slight chest pressure, states that this is since resolved though this happened last night, he has no other complaints at this time.    Home Medications Prior to Admission medications   Medication Sig Start Date End Date Taking? Authorizing Provider  acetaminophen (TYLENOL) 500 MG tablet Take 1,000 mg by mouth every 6 (six) hours as needed.    [provider]  aspirin EC 81 MG tablet Take 1 tablet (81 mg total) by mouth 2 (two) times daily after a meal. Day after surgery 04/20/20   Jene Every, MD  baclofen (LIORESAL) 10 MG tablet Take 10 mg by mouth 3 (three) times daily as needed for muscle spasms.    [provider]  Cholecalciferol (VITAMIN D) 50 MCG (2000 UT) tablet Take 2,000 Units by mouth daily.    [provider]  clindamycin (CLEOCIN) 150 MG capsule Take 2 capsules (300 mg total) by mouth 3 (three) times daily. May dispense as 150mg  capsules 06/02/20   06/04/20, PA-C  diclofenac Sodium (VOLTAREN) 1 % GEL  Apply 2 g topically 4 (four) times daily. Patient not taking: No sig reported 01/03/20   Joy, Shawn C, PA-C  docusate sodium (COLACE) 100 MG capsule Take 1 capsule (100 mg total) by mouth 2 (two) times daily as needed for mild constipation. 04/20/20   04/22/20, MD  furosemide (LASIX) 20 MG tablet Take 1 tablet (20 mg total) by mouth daily for 4 days. 06/25/21 06/29/21  Long, 07/01/21, MD  gabapentin (NEURONTIN) 300 MG capsule Take 300 mg by mouth 3 (three) times daily as needed (pain).    [provider]  methocarbamol (ROBAXIN) 750 MG tablet Take 1 tablet (750 mg total) by mouth 2 (two) times daily as needed for muscle spasms. Patient not taking: No sig reported 01/03/20   Joy, Shawn C, PA-C  naloxone [facility]) nasal spray 4 mg/0.1 mL Place 1 spray into the nose as needed (opioid overdose).    [provider]  polyethylene glycol (MIRALAX / GLYCOLAX) 17 g packet Take 17 g by mouth daily. 04/20/20   04/22/20, MD  vitamin B-12 (CYANOCOBALAMIN) 1000 MCG tablet Take 1,000 mcg by mouth daily.    [provider]  vitamin C (ASCORBIC ACID) 500 MG tablet Take 500 mg by mouth daily.    [provider]  Vitamin D, Ergocalciferol, (DRISDOL) 50000 units CAPS capsule Take 50,000 Units by mouth once a week. 01/04/15   [provider]      Allergies    Hydrocodone and Penicillins    Review of Systems   Review of Systems  Constitutional:  Negative for chills and fever.  Respiratory:  Negative for shortness of breath.   Cardiovascular:  Positive for chest pain and leg swelling.  Gastrointestinal:  Negative for abdominal pain, diarrhea, nausea and vomiting.  Neurological:  Negative for headaches.    Physical Exam Updated Vital Signs BP 122/79 (BP Location: Left Arm)   Pulse 77   Temp 98.3 F (36.8 C) (Oral)   Resp 16   Ht 5\' 9"  (1.753 m)   Wt 117.9 kg   SpO2 95%   BMI 38.40 kg/m  Physical Exam Vitals and nursing note reviewed.   Constitutional:      General: He is not in acute distress.    Appearance: He is not ill-appearing.  HENT:     Head: Normocephalic and atraumatic.     Nose: No congestion.  Eyes:     Conjunctiva/sclera: Conjunctivae normal.  Cardiovascular:     Rate and Rhythm: Normal rate and regular rhythm.     Pulses: Normal pulses.     Heart sounds: No murmur heard.    No friction rub. No gallop.  Pulmonary:     Effort: No respiratory distress.     Breath sounds: No wheezing, rhonchi or rales.  Abdominal:     Palpations: Abdomen is soft.     Tenderness: There is no right CVA tenderness or left CVA tenderness.  Musculoskeletal:     Right lower leg: Edema present.     Left lower leg: Edema present.     Comments: Patient has 1+ pitting edema up to the shins bilaterally, no overlying skin changes, 2+ dorsal pedal pulses, no calf tenderness or palpable cords.  Skin:    General: Skin is warm and dry.  Neurological:     Mental Status: He is alert.  Psychiatric:        Mood and Affect: Mood normal.     ED Results / Procedures / Treatments   Labs (all labs ordered are listed, but only abnormal results are displayed) Labs Reviewed  BASIC METABOLIC PANEL - Abnormal; Notable for the following components:      Result Value   Glucose, Bld 108 (*)    All other components within normal limits  CBC WITH DIFFERENTIAL/PLATELET  BRAIN NATRIURETIC PEPTIDE  TROPONIN I (HIGH SENSITIVITY)  TROPONIN I (HIGH SENSITIVITY)    EKG None  Radiology No results found.  Procedures Procedures    Medications Ordered in ED Medications - No data to display  ED Course/ Medical Decision Making/ A&P Clinical Course as of 07/08/21 0710  Sat Jul 08, 2021  0643 Leg swelling for last several weeks, no hx of CHF or HTN. Discharged on Lasix a couple of weeks ago. He ran out of lasix, but still having swelling in legs. He is also having some intermittent chest pains. Plan is to repeat workup from last time. Ensure  no AKI from Lasix, no new onset of CHF, DVT study.  If all looks good, we can d/c with lasix, but will need f/u with cards [GL]    Clinical Course User Index [GL] Loeffler, Jul 10, 2021, PA-C                           Medical Decision Making Amount and/or Complexity of Data Reviewed Labs: ordered. Radiology: ordered.   This patient  presents to the ED for concern of medication refill, this involves an extensive number of treatment options, and is a complaint that carries with it a high risk of complications and morbidity.  The differential diagnosis includes CHF, DVT, ACS,    Additional history obtained:  Additional history obtained from N/A External records from outside source obtained and reviewed including previous ED note   Co morbidities that complicate the patient evaluation  Obesity  Social Determinants of Health:  N/A    Lab Tests:  I Ordered, and personally interpreted labs.  The pertinent results include: CBC unremarkable, BMP shows glucose of 108, troponin is 2, BNP pending   Imaging Studies ordered:  I ordered imaging studies including chest x-ray, venous ultrasound I independently visualized and interpreted imaging which showed pending at this time I agree with the radiologist interpretation   Cardiac Monitoring:  The patient was maintained on a cardiac monitor.  I personally viewed and interpreted the cardiac monitored which showed an underlying rhythm of: Without signs of ischemia   Medicines ordered and prescription drug management:  I ordered medication including N/A I have reviewed the patients home medicines and have made adjustments as needed  Critical Interventions:  N/A   Reevaluation:  Presents with request of medication refill, has unexplained bilateral lower limb edema, will obtain basic lab work for further evaluation, add on chest x-ray for rule out of pleural effusions, and obtain ultrasound for rule out DVT  Consultations  Obtained:  N/A    Test Considered:  N/A    Rule out I have low suspicion for ACS as history is atypical, patient has no cardiac history, EKG was sinus rhythm without signs of ischemia, first troponin is reassuring.  Low suspicion for PE as patient denies pleuritic chest pain, shortness of breath, no unilateral leg swelling.  patient was PERC negative.  Low suspicion for AAA or aortic dissection as history is atypical, patient has low risk factors.  Low suspicion for systemic infection as patient is nontoxic-appearing, vital signs reassuring, no obvious source infection noted on exam.     Dispostion and problem list  Due to shift change patient will be handed off to [facility]   Follow-up on lab work imaging, rule out of DVT as well as CHF, if unremarkable patient can be discharged home can provide with additional doses of Lasix and follow-up with cardiology.            Final Clinical Impression(s) / ED Diagnoses Final diagnoses:  Pedal edema    Rx / DC Orders ED Discharge Orders     None         Carroll Sage, PA-C 07/08/21 0710    Sabas Sous, MD 07/08/21 [phone]

## 2021-07-08 NOTE — ED Triage Notes (Signed)
Patient said he ran out of his Lasix and needs a refill. He said it was working well, but he was only given a 4 day supply.

## 2021-07-10 ENCOUNTER — Encounter: Payer: Self-pay | Admitting: Internal Medicine

## 2021-07-10 ENCOUNTER — Ambulatory Visit (INDEPENDENT_AMBULATORY_CARE_PROVIDER_SITE_OTHER): Payer: Medicaid Other | Admitting: Internal Medicine

## 2021-07-10 VITALS — BP 106/60 | HR 84 | Ht 69.0 in | Wt 246.0 lb

## 2021-07-10 DIAGNOSIS — R609 Edema, unspecified: Secondary | ICD-10-CM

## 2021-07-10 MED ORDER — FUROSEMIDE 20 MG PO TABS: 20.0000 mg | ORAL_TABLET | Freq: Every day | ORAL | 3 refills | Status: DC

## 2021-07-10 NOTE — Progress Notes (Signed)
Cardiology Office Note:    Date:  07/10/2021   ID:  Roger Woods, DOB February 08, 1970, MRN [id number]  PCP:  [facility]   [facility] Providers Cardiologist:  Alverda Skeans, MD Referring MD: Claudie Leach, PA-C   Chief Complaint/Reason for Referral: ED follow-up  ASSESSMENT:    1. Edema, unspecified type     PLAN:    In order of problems listed above: 1.  Edema: The patient has had multiple BMPs that have been negative.  We will obtain an echocardiogram and lower extremity Dopplers to evaluate further.  His CMP recently demonstrated a normal albumin.  This may be due to venous insufficiency but does not look like lymphedema.  If this is the case Lasix will not be all to be helpful.  We will prescribe him Lasix 20 mg daily and I will prescribe him compression stockings; I also suggested that elevation would help.  We will keep follow-up open-ended depending on the results of this testing.   Dispo:  Return if symptoms worsen or fail to improve.      Medication Adjustments/Labs and Tests Ordered: Current medicines are reviewed at length with the patient today.  Concerns regarding medicines are outlined above.  The following changes have been made:     Labs/tests ordered: Orders Placed This Encounter  Procedures   ECHOCARDIOGRAM COMPLETE   VAS [place] LOWER EXTREMITY VENOUS (DVT)    Medication Changes: Meds ordered this encounter  Medications   furosemide (LASIX) 20 MG tablet    Sig: Take 1 tablet (20 mg total) by mouth daily.    Dispense:  90 tablet    Refill:  3     Current medicines are reviewed at length with the patient today.  The patient does not have concerns regarding medicines.   History of Present Illness:    FOCUSED PROBLEM LIST:   1.  Tobacco abuse 2.  Schizophrenia 3.  BMI of 38  The patient is a 51 y.o. male with the indicated medical history here for emergency room follow-up.  The patient had presented to the emergency department in  the middle of June.  He had been seen by general surgery for a pilonidal cyst abscess.  However peripheral edema was noted.  BNP was checked which was not elevated.  He was discharged on Lasix.  He presented on July 1 thing a refill of Lasix.  His BNP was again checked and was not elevated.  Troponin was also reassuring.  Chest x-ray showed no acute cardiopulmonary process.  The patient tells me that he has been relatively sedentary for a long time.  He has gained about 20 pounds in the last year.  Over the last couple months he has noticed increasing bilateral peripheral edema.  He denies any paroxysmal nocturnal dyspnea, orthopnea, chest pain, presyncope, or syncope.  He says Lasix has helped his edema but also notes that drinking a Pepsi seems to help but as well.  Current Medications: Current Meds  Medication Sig   baclofen (LIORESAL) 10 MG tablet Take 10 mg by mouth 3 (three) times daily as needed for muscle spasms.   Cholecalciferol (VITAMIN D) 50 MCG (2000 UT) tablet Take 2,000 Units by mouth daily.   gabapentin (NEURONTIN) 300 MG capsule Take 300 mg by mouth 3 (three) times daily as needed (pain).   methocarbamol (ROBAXIN) 750 MG tablet Take 1 tablet (750 mg total) by mouth 2 (two) times daily as needed for muscle spasms.   naloxone [facility]) nasal  spray 4 mg/0.1 mL Place 1 spray into the nose as needed (opioid overdose).   polyethylene glycol (MIRALAX / GLYCOLAX) 17 g packet Take 17 g by mouth daily.   vitamin B-12 (CYANOCOBALAMIN) 1000 MCG tablet Take 1,000 mcg by mouth daily.   vitamin C (ASCORBIC ACID) 500 MG tablet Take 500 mg by mouth daily.   Vitamin D, Ergocalciferol, (DRISDOL) 50000 units CAPS capsule Take 50,000 Units by mouth once a week.   [DISCONTINUED] acetaminophen (TYLENOL) 500 MG tablet Take 1,000 mg by mouth every 6 (six) hours as needed.   [DISCONTINUED] aspirin EC 81 MG tablet Take 1 tablet (81 mg total) by mouth 2 (two) times daily after a meal. Day after surgery    [DISCONTINUED] clindamycin (CLEOCIN) 150 MG capsule Take 2 capsules (300 mg total) by mouth 3 (three) times daily. May dispense as 150mg  capsules   [DISCONTINUED] diclofenac Sodium (VOLTAREN) 1 % GEL Apply 2 g topically 4 (four) times daily.   [DISCONTINUED] docusate sodium (COLACE) 100 MG capsule Take 1 capsule (100 mg total) by mouth 2 (two) times daily as needed for mild constipation.   [DISCONTINUED] furosemide (LASIX) 20 MG tablet Take 1 tablet (20 mg total) by mouth daily for 7 days.     Allergies:    Hydrocodone and Penicillins   Social History:   Social History   Tobacco Use   Smoking status: Every Day    Packs/day: 1.00    Years: 7.00    Total pack years: 7.00    Types: Cigarettes   Smokeless tobacco: Never  Vaping Use   Vaping Use: Never used  Substance Use Topics   Alcohol use: Not Currently   Drug use: No     Family Hx: Family History  Problem Relation Age of Onset   High blood pressure Maternal Grandmother    Cancer Other      Review of Systems:   Please see the history of present illness.    All other systems reviewed and are negative.     EKGs/Labs/Other Test Reviewed:    EKG:  EKG performed July 08, 2021 that I personally reviewed demonstrates sinus rhythm with repolarization abnormality  Prior CV studies: None available    Other studies Reviewed: Review of the additional studies/records demonstrates: CT abdomen pelvis February 2023 without aortic pathology  Recent Labs: 06/25/2021: ALT 18 07/08/2021: B Natriuretic Peptide 27.2; BUN 13; Creatinine, Ser 0.88; Hemoglobin 14.8; Platelets 204; Potassium 3.9; Sodium 137   Recent Lipid Panel No results found for: "CHOL", "TRIG", "HDL", "LDLCALC", "LDLDIRECT"  Risk Assessment/Calculations:          Physical Exam:    VS:  BP 106/60 (BP Location: Left Arm, Patient Position: Sitting, Cuff Size: Normal)   Pulse 84   Ht 5\' 9"  (1.753 m)   Wt 246 lb (111.6 kg)   BMI 36.33 kg/m    Wt Readings from  Last 3 Encounters:  07/10/21 246 lb (111.6 kg)  07/08/21 260 lb (117.9 kg)  06/25/21 260 lb (117.9 kg)    GENERAL:  No apparent distress, AOx3 HEENT:  No carotid bruits, +2 carotid impulses, no scleral icterus CAR: RRR no murmurs, gallops, rubs, or thrills RES:  Clear to auscultation bilaterally ABD:  Soft, nontender, nondistended, positive bowel sounds x 4 VASC:  +2 radial pulses, +2 carotid pulses, palpable pedal pulses NEURO:  CN 2-12 grossly intact; motor and sensory grossly intact PSYCH:  No active depression or anxiety EXT:  +1- +2 edema, without ecchymosis, or cyanosis  Signed, [name], MD  07/10/2021 4:46 PM    [facility] [address] [place], [place], [place]  [address] Phone: [phone]; Fax: [phone]   Note:  This document was prepared using Dragon voice recognition software and may include unintentional dictation errors.

## 2021-07-10 NOTE — Patient Instructions (Signed)
Medication Instructions:  Your physician has recommended you make the following change in your medication:  Lasix 20mg  Daily  *If you need a refill on your cardiac medications before your next appointment, please call your pharmacy*   Testing/Procedures: Echo Your physician has requested that you have an echocardiogram. Echocardiography is a painless test that uses sound waves to create images of your heart. It provides your doctor with information about the size and shape of your heart and how well your heart's chambers and valves are working. This procedure takes approximately one hour. There are no restrictions for this procedure.   Lower extremity Duplex    Follow-Up: At [facility], you and your health needs are our priority.  As part of our continuing mission to provide you with exceptional heart care, we have created designated Provider Care Teams.  These Care Teams include your primary Cardiologist (physician) and Advanced Practice Providers (APPs -  Physician Assistants and Nurse Practitioners) who all work together to provide you with the care you need, when you need it.  We recommend signing up for the patient portal called "MyChart".  Sign up information is provided on this After Visit Summary.  MyChart is used to connect with patients for Virtual Visits (Telemedicine).  Patients are able to view lab/test results, encounter notes, upcoming appointments, etc.  Non-urgent messages can be sent to your provider as well.   To learn more about what you can do with MyChart, go to [facility].    Your next appointment:   As needed  Provider:   None {    Important Information About Sugar

## 2021-07-26 ENCOUNTER — Ambulatory Visit (HOSPITAL_BASED_OUTPATIENT_CLINIC_OR_DEPARTMENT_OTHER): Payer: Medicaid Other

## 2021-07-26 ENCOUNTER — Ambulatory Visit (HOSPITAL_COMMUNITY)
Admission: RE | Admit: 2021-07-26 | Discharge: 2021-07-26 | Disposition: A | Discharge status: Home / Self Care | Payer: Medicaid Other | Source: Ambulatory Visit | Attending: Cardiovascular Disease | Admitting: Cardiovascular Disease

## 2021-07-26 DIAGNOSIS — R600 Localized edema: Secondary | ICD-10-CM

## 2021-07-26 DIAGNOSIS — R609 Edema, unspecified: Secondary | ICD-10-CM | POA: Insufficient documentation

## 2021-07-26 DIAGNOSIS — R0602 Shortness of breath: Secondary | ICD-10-CM

## 2021-07-26 LAB — ECHOCARDIOGRAM COMPLETE
Area-P 1/2: 4.44 cm2
S' Lateral: 2.9 cm

## 2021-09-11 ENCOUNTER — Emergency Department (HOSPITAL_COMMUNITY)
Admission: EM | Admit: 2021-09-11 | Discharge: 2021-09-12 | Disposition: A | Discharge status: Home / Self Care | Payer: Medicaid Other | Attending: Emergency Medicine | Admitting: Emergency Medicine

## 2021-09-11 ENCOUNTER — Other Ambulatory Visit: Payer: Self-pay

## 2021-09-11 ENCOUNTER — Encounter (HOSPITAL_COMMUNITY): Payer: Self-pay | Admitting: Emergency Medicine

## 2021-09-11 DIAGNOSIS — L0231 Cutaneous abscess of buttock: Secondary | ICD-10-CM | POA: Insufficient documentation

## 2021-09-11 DIAGNOSIS — Z872 Personal history of diseases of the skin and subcutaneous tissue: Secondary | ICD-10-CM

## 2021-09-11 DIAGNOSIS — M7918 Myalgia, other site: Secondary | ICD-10-CM

## 2021-09-11 NOTE — ED Triage Notes (Signed)
Patient reports chronic / recurrent abscess at buttocks for > 1 year . No fever or drainage.

## 2021-09-11 NOTE — ED Provider Triage Note (Signed)
Emergency Medicine Provider Triage Evaluation Note  Roger Woods , a 51 y.o. male  was evaluated in triage.  Pt complains of abscess between his gluteal folds.  He states he has been here for this in the past and has had lanced.  Has been developing again over the last couple weeks and he is having significant pain.  He was post have surgery for removal but was not able to make his surgery secondary to deaths in his family.  He denies any fevers or chills.  He has not been taking anything for pain.  Denies any difficulty moving his bowels.  Review of Systems  Positive: As above Negative: As above  Physical Exam  BP 121/81 (BP Location: Right Arm)   Pulse 80   Temp 98.9 F (37.2 C) (Oral)   Resp 16   SpO2 98%  Gen:   Awake, no distress   Resp:  Normal effort  MSK:   Moves extremities without difficulty  Other:  Visible indurated abscess between the gluteal folds.  No active drainage, no significant erythema  Medical Decision Making  Medically screening exam initiated at 10:27 PM.  Appropriate orders placed.  ORVAL DORTCH was informed that the remainder of the evaluation will be completed by another provider, this initial triage assessment does not replace that evaluation, and the importance of remaining in the ED until their evaluation is complete.     Mare Ferrari, PA-C 09/11/21 2228

## 2021-09-12 MED ORDER — DOXYCYCLINE HYCLATE 100 MG PO CAPS: 100.0000 mg | ORAL_CAPSULE | Freq: Two times a day (BID) | ORAL | 0 refills | Status: DC

## 2021-09-12 NOTE — ED Provider Notes (Signed)
[facility] EMERGENCY DEPARTMENT Provider Note   CSN: [id number] Arrival date & time: 09/11/21  2219     History  Chief Complaint  Patient presents with   Abscess    Roger Woods is a 51 y.o. male.  HPI     This is a 51 year old male with a history of recurrent pilonidal abscess who presents with concern for recurrent abscess.  Patient reports he has been having pain in left buttock for "quite some time."  He reports over the last couple weeks he has been dealing with significant pain.  He was due to be evaluated by surgery for removal; however, he states he was not able to follow-up.  Patient has not taken anything for pain.  Denies systemic symptoms.  Denies fever.  Chart reviewed.  He last followed up with general surgery in June.  At that time he was seen in urgent care without an obvious drainable abscess.  He was seen the next day by general surgery.  Incision and drainage was attempted in office but no purulence was expressed.  He did have some bloody drainage.  He was to follow-up in 2 weeks but has not followed up.  Home Medications Prior to Admission medications   Medication Sig Start Date End Date Taking? Authorizing Provider  doxycycline (VIBRAMYCIN) 100 MG capsule Take 1 capsule (100 mg total) by mouth 2 (two) times daily. 09/12/21  Yes Johnnae Impastato, Mayer Masker, MD  baclofen (LIORESAL) 10 MG tablet Take 10 mg by mouth 3 (three) times daily as needed for muscle spasms.    [provider]  Cholecalciferol (VITAMIN D) 50 MCG (2000 UT) tablet Take 2,000 Units by mouth daily.    [provider]  furosemide (LASIX) 20 MG tablet Take 1 tablet (20 mg total) by mouth daily. 07/10/21   [name], MD  gabapentin (NEURONTIN) 300 MG capsule Take 300 mg by mouth 3 (three) times daily as needed (pain).    [provider]  methocarbamol (ROBAXIN) 750 MG tablet Take 1 tablet (750 mg total) by mouth 2 (two) times daily as needed for muscle  spasms. 01/03/20   Joy, Shawn C, PA-C  naloxone (NARCAN) nasal spray 4 mg/0.1 mL Place 1 spray into the nose as needed (opioid overdose).    [provider]  polyethylene glycol (MIRALAX / GLYCOLAX) 17 g packet Take 17 g by mouth daily. 04/20/20   Jene Every, MD  vitamin B-12 (CYANOCOBALAMIN) 1000 MCG tablet Take 1,000 mcg by mouth daily.    [provider]  vitamin C (ASCORBIC ACID) 500 MG tablet Take 500 mg by mouth daily.    [provider]  Vitamin D, Ergocalciferol, (DRISDOL) 50000 units CAPS capsule Take 50,000 Units by mouth once a week. 01/04/15   [provider]      Allergies    Hydrocodone and Penicillins    Review of Systems   Review of Systems  Skin:        Abscess  All other systems reviewed and are negative.   Physical Exam Updated Vital Signs BP 101/66 (BP Location: Right Arm)   Pulse (!) 55   Temp 98.1 F (36.7 C) (Oral)   Resp 17   SpO2 95%  Physical Exam Vitals and nursing note reviewed.  Constitutional:      Appearance: He is well-developed. He is obese. He is not ill-appearing.  HENT:     Head: Normocephalic and atraumatic.  Eyes:     Pupils: Pupils are  equal, round, and reactive to light.  Cardiovascular:     Rate and Rhythm: Normal rate and regular rhythm.  Pulmonary:     Effort: Pulmonary effort is normal. No respiratory distress.  Abdominal:     Palpations: Abdomen is soft.     Tenderness: There is no abdominal tenderness.  Musculoskeletal:     Cervical back: Neck supple.  Lymphadenopathy:     Cervical: No cervical adenopathy.  Skin:    General: Skin is warm and dry.     Comments: Tenderness palpation just left of the gluteal cleft, scarring noted from prior incision and drainage, no true fluctuance or induration noted, no overlying skin changes or erythema  Neurological:     Mental Status: He is alert and oriented to person, place, and time.  Psychiatric:        Mood and Affect: Mood normal.     ED  Results / Procedures / Treatments   Labs (all labs ordered are listed, but only abnormal results are displayed) Labs Reviewed - No data to display  EKG None  Radiology No results found.  Procedures Ultrasound ED Soft Tissue  Date/Time: 09/12/2021 5:46 AM  Performed by: Shon Baton, MD Authorized by: Shon Baton, MD   Procedure details:    Indications: localization of abscess     Transverse view:  Visualized   Longitudinal view:  Visualized   Images: not archived   Location:    Location: buttocks     Side:  Left Findings:     no abscess present Comments:     No discernible abscess or drainable fluid collection identified on bedside ultrasound, there was some mild cobblestoning which could indicate early cellulitis     Medications Ordered in ED Medications - No data to display  ED Course/ Medical Decision Making/ A&P                           Medical Decision Making Risk Prescription drug management.   This patient presents to the ED for concern of recurrent pilonidal abscess, this involves an extensive number of treatment options, and is a complaint that carries with it a high risk of complications and morbidity.  I considered the following differential and admission for this acute, potentially life threatening condition.  The differential diagnosis includes abscess, cellulitis, scar tissue  MDM:    This is a 51 year old male who presents with recurrent buttock pain which she associates with known pilonidal abscess.  He is nontoxic and vital signs are reassuring.  He is afebrile.  Exam is fairly inconclusive.  I do not feel definite fluctuance or induration.  I used bedside ultrasound and did not visualize a drainable abscess.  He had some slight cobblestoning which could indicate some mild cellulitis although visually I do not appreciate this on exam.  Patient reports weeks of pain.  After chart review, this seems similar to his last presentation when there  was very minimal drainage after I&D in the surgery office.  I will send patient home with a prescription for doxycycline and have recommended sitz bath's.  Recommend close follow-up with general surgery as previously recommended for options regarding surgery.  (Labs, imaging, consults)  Labs: I Ordered, and personally interpreted labs.  The pertinent results include: None  Imaging Studies ordered: I ordered imaging studies including none I independently visualized and interpreted imaging. I agree with the radiologist interpretation  Additional history obtained from chart review.  External records  from outside source obtained and reviewed including general surgery clinic notes  Cardiac Monitoring: The patient was maintained on a cardiac monitor.  I personally viewed and interpreted the cardiac monitored which showed an underlying rhythm of: Sinus rhythm  Reevaluation: After the interventions noted above, I reevaluated the patient and found that they have :stayed the same  Social Determinants of Health: Lives independently  Disposition: Discharge  Co morbidities that complicate the patient evaluation  Past Medical History:  Diagnosis Date   Chronic pain    Headache(784.0)    Hemorrhoids    Insomnia due to medical condition    Psychosis (HCC)    Schizophrenia (HCC)    Weakness    occasionally in both arms and hands;related to neck issues     Medicines Meds ordered this encounter  Medications   doxycycline (VIBRAMYCIN) 100 MG capsule    Sig: Take 1 capsule (100 mg total) by mouth 2 (two) times daily.    Dispense:  20 capsule    Refill:  0    I have reviewed the patients home medicines and have made adjustments as needed  Problem List / ED Course: Problem List Items Addressed This Visit   None Visit Diagnoses     Buttock pain    -  Primary   History of pilonidal cyst                       Final Clinical Impression(s) / ED Diagnoses Final diagnoses:   Buttock pain  History of pilonidal cyst    Rx / DC Orders ED Discharge Orders          Ordered    doxycycline (VIBRAMYCIN) 100 MG capsule  2 times daily        09/12/21 0543              Colen Eltzroth, Mayer Masker, MD 09/12/21 [phone]

## 2021-09-12 NOTE — Discharge Instructions (Signed)
You were seen today for pain that you associate with prior pilonidal abscess.  There is no obvious abscess on exam today including on ultrasound examination.  However, your pain is where prior incision and drainages have been.  Take sitz bath's twice daily.  You will be given a short course of antibiotics to cover for any occult cellulitis.  Follow-up with general surgery regarding additional options for potential surgery and cyst removal.

## 2021-10-05 ENCOUNTER — Telehealth: Payer: Self-pay

## 2021-10-05 NOTE — Telephone Encounter (Signed)
   Name: Roger Woods  DOB: 05-Mar-1970  MRN: [id number]  Primary Cardiologist: None   Preoperative team, please contact this patient and set up a phone call appointment for further preoperative risk assessment. Please obtain consent and complete medication review. Thank you for your help.  I confirm that guidance regarding antiplatelet and oral anticoagulation therapy has been completed and, if necessary, noted below (none requested).    Lenna Sciara, NP 10/05/2021, 12:41 PM [place]

## 2021-10-05 NOTE — Telephone Encounter (Signed)
..     Pre-operative Risk Assessment    Patient Name: Roger Woods  DOB: 12-Dec-1970 MRN: [id number]      Request for Surgical Clearance    Procedure:   Pilonidal Abscess  Date of Surgery:  Clearance TBD                                 Surgeon:  Dr Louanna Raw Surgeon's Group or Practice Name:  [facility] Phone number:  [phone] Fax number:  [phone]   Type of Clearance Requested:   - Medical    Type of Anesthesia:  General    Additional requests/questions:    Gwenlyn Found   10/05/2021, 12:33 PM

## 2021-10-06 ENCOUNTER — Encounter (HOSPITAL_COMMUNITY): Payer: Self-pay | Admitting: Physician Assistant

## 2021-10-06 ENCOUNTER — Other Ambulatory Visit: Payer: Self-pay

## 2021-10-06 ENCOUNTER — Encounter (HOSPITAL_COMMUNITY): Payer: Self-pay | Admitting: Surgery

## 2021-10-06 NOTE — Progress Notes (Addendum)
For Short Stay: [place] appointment date: N/A Date of COVID positive in last 90 days: N/A  Bowel Prep reminder: N/A   For Anesthesia: PCP - [facility] Cardiologist - Early Osmond, MD  last office visit note 07/10/21 in epic, cardiac clearance pending  Chest x-ray -  07/08/21 in epic EKG - 07/08/21 in epic Stress Test - N/A ECHO - 07/26/21 in epic Cardiac Cath - N/A Pacemaker/ICD device last checked: N/A Pacemaker orders received: N/A Device Rep notified:N/A  Spinal Cord Stimulator:N/A  Sleep Study - N/A CPAP - N/A  Fasting Blood Sugar - N/A Checks Blood Sugar __N/A___ times a day Date and result of last Hgb A1c-N/A  Blood Thinner Instructions: N/A Aspirin Instructions: N/A Last Dose: N/A  Activity level: Can go up a flight of stairs and activities of daily living without stopping and without chest pain and/or shortness of breath    Anesthesia review: awaiting cardiac clearance   Patient denies shortness of breath, fever, cough and chest pain at PAT appointment   Patient verbalized understanding of instructions that were given to them at the PAT appointment. Patient was also instructed that they will need to review over the PAT instructions again at home before surgery.

## 2021-10-06 NOTE — Telephone Encounter (Signed)
Spoke with patient who would like a call back later.

## 2021-10-09 ENCOUNTER — Ambulatory Visit (HOSPITAL_COMMUNITY): Admission: RE | Admit: 2021-10-09 | Payer: Medicaid Other | Source: Home / Self Care | Admitting: Surgery

## 2021-10-09 ENCOUNTER — Telehealth: Payer: Self-pay | Admitting: *Deleted

## 2021-10-09 ENCOUNTER — Encounter: Payer: Self-pay | Admitting: Nurse Practitioner

## 2021-10-09 ENCOUNTER — Ambulatory Visit (HOSPITAL_COMMUNITY): Payer: Medicaid Other | Attending: Nurse Practitioner | Admitting: Nurse Practitioner

## 2021-10-09 DIAGNOSIS — L0591 Pilonidal cyst without abscess: Secondary | ICD-10-CM

## 2021-10-09 DIAGNOSIS — Z79899 Other long term (current) drug therapy: Secondary | ICD-10-CM

## 2021-10-09 DIAGNOSIS — Z01810 Encounter for preprocedural cardiovascular examination: Secondary | ICD-10-CM | POA: Diagnosis not present

## 2021-10-09 HISTORY — DX: Edema, unspecified: R60.9

## 2021-10-09 HISTORY — DX: Type 2 diabetes mellitus without complications: E11.9

## 2021-10-09 HISTORY — DX: Other complications of anesthesia, initial encounter: T88.59XA

## 2021-10-09 HISTORY — DX: Sprain of anterior cruciate ligament of unspecified knee, initial encounter: S83.519A

## 2021-10-09 SURGERY — EXCISION, SIMPLE PILONIDAL CYST
Anesthesia: General

## 2021-10-09 NOTE — Telephone Encounter (Signed)
Pt has scheduled for a tele visit today, 10/09/21, since surgery is this afternoon.  Consent on file / medications reconciled.    Patient Consent for Virtual Visit        Roger Woods has provided verbal consent on 10/09/2021 for a virtual visit (video or telephone).   CONSENT FOR VIRTUAL VISIT FOR:  Roger Woods  By participating in this virtual visit I agree to the following:  I hereby voluntarily request, consent and authorize [facility] and its employed or contracted physicians, physician assistants, nurse practitioners or other licensed health care professionals (the Practitioner), to provide me with telemedicine health care services (the "Services") as deemed necessary by the treating Practitioner. I acknowledge and consent to receive the Services by the Practitioner via telemedicine. I understand that the telemedicine visit will involve communicating with the Practitioner through live audiovisual communication technology and the disclosure of certain medical information by electronic transmission. I acknowledge that I have been given the opportunity to request an in-person assessment or other available alternative prior to the telemedicine visit and am voluntarily participating in the telemedicine visit.  I understand that I have the right to withhold or withdraw my consent to the use of telemedicine in the course of my care at any time, without affecting my right to future care or treatment, and that the Practitioner or I may terminate the telemedicine visit at any time. I understand that I have the right to inspect all information obtained and/or recorded in the course of the telemedicine visit and may receive copies of available information for a reasonable fee.  I understand that some of the potential risks of receiving the Services via telemedicine include:  Delay or interruption in medical evaluation due to technological equipment failure or disruption; Information  transmitted may not be sufficient (e.g. poor resolution of images) to allow for appropriate medical decision making by the Practitioner; and/or  In rare instances, security protocols could fail, causing a breach of personal health information.  Furthermore, I acknowledge that it is my responsibility to provide information about my medical history, conditions and care that is complete and accurate to the best of my ability. I acknowledge that Practitioner's advice, recommendations, and/or decision may be based on factors not within their control, such as incomplete or inaccurate data provided by me or distortions of diagnostic images or specimens that may result from electronic transmissions. I understand that the practice of medicine is not an exact science and that Practitioner makes no warranties or guarantees regarding treatment outcomes. I acknowledge that a copy of this consent can be made available to me via my patient portal ([facility]), or I can request a printed copy by calling the office of [facility].    I understand that my insurance will be billed for this visit.   I have read or had this consent read to me. I understand the contents of this consent, which adequately explains the benefits and risks of the Services being provided via telemedicine.  I have been provided ample opportunity to ask questions regarding this consent and the Services and have had my questions answered to my satisfaction. I give my informed consent for the services to be provided through the use of telemedicine in my medical care

## 2021-10-09 NOTE — Progress Notes (Signed)
Virtual Visit via Telephone Note   Because of Roger Woods's co-morbid illnesses, he is at least at moderate risk for complications without adequate follow up.  This format is felt to be most appropriate for this patient at this time.  The patient did not have access to video technology/had technical difficulties with video requiring transitioning to audio format only (telephone).  All issues noted in this document were discussed and addressed.  No physical exam could be performed with this format.  Please refer to the patient's chart for his consent to telehealth for [facility].  Evaluation Performed:  Preoperative cardiovascular risk assessment _____________   Date:  10/09/2021   Patient ID:  Roger Woods, DOB 1970-09-20, MRN [id number] Patient Location:  Home Provider location:   Office  Primary Care Provider:  [facility] Primary Cardiologist:  Early Osmond, MD  Chief Complaint / Patient Profile   51 y.o. y/o male with a h/o leg edema and type 2 diabetes who is pending pilonidal cyst and presents today for telephonic preoperative cardiovascular risk assessment.  Past Medical History    Past Medical History:  Diagnosis Date   ACL tear    left leg   Chronic pain    Complication of anesthesia    Prolonged sedation   Diabetes mellitus without complication ([place])    Edema    Headache(784.0)    Hemorrhoids    Insomnia due to medical condition    Psychosis ([place])    Schizophrenia ([place])    Weakness    occasionally in both arms and hands;related to neck issues   Past Surgical History:  Procedure Laterality Date   HARDWARE REMOVAL N/A 06/24/2013   Procedure: EXPLORATION/HARDWARE REMOVAL CERVICAL FOUR-FIVE, CERVICAL FIVE-SIX, AND CERVICAL SIX-SEVEN.;  Surgeon: Elaina Hoops, MD;  Location: [place] [facility];  Service: Neurosurgery;  Laterality: N/A;  posteriorpt. states hardware was not removed   POSTERIOR CERVICAL FUSION/FORAMINOTOMY N/A 11/19/2012    Procedure: CERVICAL TWO TO CERVICAL SEVEN POSTERIOR CERVICAL FUSION/FORAMINOTOMY LEVEL 5;  Surgeon: Elaina Hoops, MD;  Location: [place] [facility];  Service: Neurosurgery;  Laterality: N/A;  C2-7 posteior cervical arthrodesis with instrumentation   SHOULDER ARTHROSCOPY WITH ROTATOR CUFF REPAIR AND SUBACROMIAL DECOMPRESSION Left 04/20/2020   Procedure: SHOULDER ARTHROSCOPY WITH MINI OPEN  ROTATOR CUFF REPAIR AND SUBACROMIAL DECOMPRESSION;  Surgeon: Susa Day, MD;  Location: [facility];  Service: Orthopedics;  Laterality: Left;  90 MINS    Allergies  Allergies  Allergen Reactions   Hydrocodone Itching and Other (See Comments)    Throat itching    Penicillins Other (See Comments)    Unknown reaction Has patient had a PCN reaction causing immediate rash, facial/tongue/throat swelling, SOB or lightheadedness with hypotension: Yes Has patient had a PCN reaction causing severe rash involving mucus membranes or skin necrosis: Yes Has patient had a PCN reaction that required hospitalization No Has patient had a PCN reaction occurring within the last 10 years: No If all of the above answers are "NO", then may proceed with Cephalosporin use.     History of Present Illness    Roger Woods is a 51 y.o. male who presents via audio/video conferencing for a telehealth visit today.  Pt was last seen in cardiology clinic on 07/10/21 by Dr. Ali Lowe.  At that time Roger Woods was doing well .  The patient is now pending procedure as outlined above. Since his last visit, he  denies chest pain, shortness of breath, lower extremity edema,  fatigue, palpitations, melena, hematuria, hemoptysis, diaphoresis, weakness, presyncope, syncope, orthopnea, and PND. He admits that he is very inactive and has "pain shooting all across his body" when he walks any distance. Currently, the longest distance he walks at one time is from inside his apartment to his vehicle.    Home Medications    Prior to Admission medications    Medication Sig Start Date End Date Taking? Authorizing Provider  atorvastatin (LIPITOR) 10 MG tablet Take 10 mg by mouth at bedtime. 10/04/21   [provider]  baclofen (LIORESAL) 10 MG tablet Take 10 mg by mouth as needed for muscle spasms.    [provider]  furosemide (LASIX) 20 MG tablet Take 1 tablet (20 mg total) by mouth daily. 07/10/21   [name], MD  metFORMIN (GLUCOPHAGE) 500 MG tablet Take 500 mg by mouth daily. 10/04/21   [provider]  naloxone [facility]) nasal spray 4 mg/0.1 mL Place 0.4 mg into the nose as needed (opioid overdose).    [provider]  Oxycodone HCl 10 MG TABS Take 10 mg by mouth 5 (five) times daily as needed (pain). 09/19/21   [provider]  polyethylene glycol (MIRALAX / GLYCOLAX) 17 g packet Take 17 g by mouth daily. Patient not taking: Reported on 10/06/2021 04/20/20   Jene Every, MD  vitamin B-12 (CYANOCOBALAMIN) 1000 MCG tablet Take 1,000 mcg by mouth daily.    [provider]  vitamin C (ASCORBIC ACID) 500 MG tablet Take 500 mg by mouth daily.    [provider]  Vitamin D, Ergocalciferol, (DRISDOL) 50000 units CAPS capsule Take 50,000 Units by mouth once a week. 01/04/15   [provider]    Physical Exam    Vital Signs:  Roger Woods does not have vital signs available for review today.  Given telephonic nature of communication, physical exam is limited. AAOx3. NAD. Normal affect.  Speech and respirations are unlabored.  Accessory Clinical Findings    None  Assessment & Plan    1.  Preoperative Cardiovascular Risk Assessment: Patient is unable to achieve > 4 METS activity due to difficulty walking more than a few steps without back pain. He uses a cane and states the longest distance he can walk is from inside his apartment to his vehicle. Therefore, he will require nuclear stress testing for clearance to proceed with surgery under general anesthesia.  I have  notified [place], Steward Drone of the situation and she asked me to notify her once patient is cleared.  The patient was advised that if he develops new symptoms prior to surgery to contact our office to arrange for a follow-up visit, and he verbalized understanding.  No request to hold medications for upcoming procedure.   A copy of this note will be routed to requesting surgeon.  Time:   Today, I have spent 10 minutes with the patient with telehealth technology discussing medical history, symptoms, and management plan.     Levi Aland, NP-C  10/09/2021, 10:43 AM [address] Office [phone] Fax [phone]

## 2021-10-09 NOTE — Telephone Encounter (Signed)
Pt is scheduled for a tele visit today, 10/09/21, transferred to Christen Bame, NP for tele visit today.  Consent on file / medications reconciled.

## 2021-10-09 NOTE — Telephone Encounter (Signed)
Telehealth visit conducted.  Patient is not cleared for surgery due to inability to achieve > 4 METS activity. He is scheduled for lexiscan myoview on 10/3. [facility] manager, Hassan Rowan, is aware and will await clearance from our office.  Emmaline Life, NP-C  10/09/2021, 11:08 AM [address] Office [phone] Fax [phone]

## 2021-10-10 ENCOUNTER — Encounter (HOSPITAL_COMMUNITY): Payer: Medicaid Other

## 2021-10-16 ENCOUNTER — Telehealth (HOSPITAL_COMMUNITY): Payer: Self-pay

## 2021-10-16 NOTE — Telephone Encounter (Signed)
Attempted to contact the patient, his voicemail was full and a message was unable to be left. S.Khyrie Masi EMTP

## 2021-10-17 ENCOUNTER — Ambulatory Visit (HOSPITAL_COMMUNITY): Payer: Medicaid Other | Attending: Nurse Practitioner

## 2021-10-17 DIAGNOSIS — Z01810 Encounter for preprocedural cardiovascular examination: Secondary | ICD-10-CM | POA: Insufficient documentation

## 2021-10-17 LAB — MYOCARDIAL PERFUSION IMAGING
LV dias vol: 112 mL (ref 62–150)
LV sys vol: 46 mL
Nuc Stress EF: 59 %
Peak HR: 110 {beats}/min
Rest HR: 59 {beats}/min
Rest Nuclear Isotope Dose: 10.2 mCi
SDS: 0
SRS: 0
SSS: 0
ST Depression (mm): 0 mm
Stress Nuclear Isotope Dose: 32.4 mCi
TID: 1.01

## 2021-10-17 MED ORDER — TECHNETIUM TC 99M TETROFOSMIN IV KIT
10.2000 | PACK | Freq: Once | INTRAVENOUS | Status: AC | PRN
  Administered 2021-10-17: 10.2 via INTRAVENOUS

## 2021-10-17 MED ORDER — TECHNETIUM TC 99M TETROFOSMIN IV KIT
32.4000 | PACK | Freq: Once | INTRAVENOUS | Status: AC | PRN
  Administered 2021-10-17: 32.4 via INTRAVENOUS

## 2021-10-17 MED ORDER — REGADENOSON 0.4 MG/5ML IV SOLN
0.4000 mg | Freq: Once | INTRAVENOUS | Status: AC
  Administered 2021-10-17: 0.4 mg via INTRAVENOUS

## 2021-10-17 NOTE — Telephone Encounter (Signed)
   Primary Cardiologist: Early Osmond, MD  Chart reviewed as part of pre-operative protocol coverage. Given past medical history and time since last visit, based on ACC/AHA guidelines, CLARE CASTO would be at acceptable risk for the planned procedure without further cardiovascular testing.   Stress test completed on 10/17/21 revealed no evidence of infarction or ischemia, low risk.  LV function is normal.  I will route this recommendation to the requesting party via Epic fax function and remove from pre-op pool.  Please call with questions.  Emmaline Life, NP-C  10/17/2021, 4:47 PM [address] Office [phone] Fax [phone]

## 2021-10-30 NOTE — Progress Notes (Signed)
I sent an IB message to Dr. Thermon Leyland for surgery orders on this patient. This case was rescheduled d/t waiting on cardiac clearance.

## 2021-10-30 NOTE — Progress Notes (Addendum)
COVID Vaccine received:  [x]  No []  Yes Date of any COVID positive Test in last 90 days:  PCP - [facility] Cardiologist - Lenna Sciara, MD Clearance by Christen Bame, NP- 10-09-21 progress note in Epic  Chest x-ray - 07-08-2021  Epic EKG -  07-08-21  Epic Stress Test - n/a ECHO - 07-26-21 Epic Cardiac Cath - n/a  Pacemaker/ICD device     [x]  N/A Spinal Cord Stimulator:[x]  No []  Yes      (Remind patient to bring remote DOS) Other Implants:   Bowel Prep - none  History of Sleep Apnea? [x]  No []  Yes   Sleep Study Date:   CPAP used?- [x]  No []  Yes  (Instruct to bring their mask & Tubing)  Does the patient monitor blood sugar? []  No []  Yes  [x]  N/A Does patient have a [employer] or Dexacom? []  No []  Yes   Fasting Blood Sugar Ranges-  Checks Blood Sugar ___0__ times a day  Blood Thinner Instructions:n/a Aspirin Instructions:n/a Last Dose:  ERAS Protocol Ordered: []  No  []  Yes PRE-SURGERY []  ENSURE  []  G2   Comments: No orders as of 10-30-21. Sent IB message to Dr. Thermon Leyland.   Activity level: Patient can climb a flight of stairs without difficulty;  [x]  No CP  [x]  No SOB.   Patient can perform ADLs without assistance.   Anesthesia review: DM2, Schizophrenia, psychosis, hx Posterior cervical fusion (C2-C7) in 2014 - Had some hardware removed in 2015.  Patient denies shortness of breath, fever, cough and chest pain at PAT appointment.  Patient verbalized understanding and agreement to the Pre-Surgical Instructions that were given to them at this PAT appointment. Patient was also educated of the need to review these PAT instructions again prior to his/her surgery.I reviewed the appropriate phone numbers to call if they have any and questions or concerns.

## 2021-10-30 NOTE — Patient Instructions (Signed)
SURGICAL WAITING ROOM VISITATION Patients having surgery or a procedure may have no more than 2 support people in the waiting area - these visitors may rotate in the visitor waiting room.   Children under the age of 58 must have an adult with them who is not the patient. If the patient needs to stay at the hospital during part of their recovery, the visitor guidelines for inpatient rooms apply.  PRE-OP VISITATION  Pre-op nurse will coordinate an appropriate time for 1 support person to accompany the patient in pre-op.  This support person may not rotate.  This visitor will be contacted when the time is appropriate for the visitor to come back in the pre-op area.  Please refer to the [facility] website for the visitor guidelines for Inpatients (after your surgery is over and you are in a regular room).  You are not required to quarantine at this time prior to your surgery. However, you must do this: Hand Hygiene often Do NOT share personal items Notify your provider if you are in close contact with someone who has COVID or you develop fever 100.4 or greater, new onset of sneezing, cough, sore throat, shortness of breath or body aches.  If you test positive for Covid or have been in contact with anyone that has tested positive in the last 10 days please notify you surgeon.    Your procedure is scheduled on:  Friday  November 03, 2021  Report to [facility] Main Entrance: [place] entrance where the [employer] is available.   Report to admitting at:  1:45 PM  +++++Call this number if you have any questions or problems the morning of surgery [phone]  Do not eat food after Midnight the night prior to your surgery/procedure.  After Midnight you may have the following liquids until   1:00 PM DAY OF SURGERY  Clear Liquid Diet Water Black Coffee (sugar ok, NO MILK/CREAM OR CREAMERS)  Tea (sugar ok, NO MILK/CREAM OR CREAMERS) regular and decaf                             Plain  Jell-O  with no fruit (NO RED)                                           Fruit ices (not with fruit pulp, NO RED)                                     Popsicles (NO RED)                                                                  Juice: apple, WHITE grape, WHITE cranberry Sports drinks like Gatorade or Powerade (NO RED)                 FOLLOW BOWEL PREP AND ANY ADDITIONAL PRE OP INSTRUCTIONS YOU RECEIVED FROM YOUR SURGEON'S OFFICE!!!   Oral Hygiene is also important to reduce your risk of infection.  Remember - BRUSH YOUR TEETH THE MORNING OF SURGERY WITH YOUR REGULAR TOOTHPASTE  Do NOT smoke after Midnight the night before surgery.  Take ONLY these medicines the morning of surgery with A SIP OF WATER: oxycodone if needed for pain   If You have been diagnosed with Sleep Apnea - Bring CPAP mask and tubing day of surgery. We will provide you with a CPAP machine on the day of your surgery.                   You may not have any metal on your body including jewelry, and body piercing  Do not wear lotions, powders,  cologne, or deodorant  Men may shave face and neck.  Contacts, Hearing Aids, dentures or bridgework may not be worn into surgery.   You may bring a small overnight bag with you on the day of surgery, only pack items that are not valuable .[facility] IS NOT RESPONSIBLE   FOR VALUABLES THAT ARE LOST OR STOLEN.   Do not bring your home medications to the hospital. The Pharmacy will dispense medications listed on your medication list to you during your admission in the Hospital.  Special Instructions: Bring a copy of your healthcare power of attorney and living will documents the day of surgery, if you wish to have them scanned into your [facility] Medical Records- EPIC  Please read over the following fact sheets you were given: IF YOU HAVE QUESTIONS ABOUT YOUR PRE-OP INSTRUCTIONS, PLEASE CALL [phone]  (KAY)   [employer] - Preparing for Surgery Before  surgery, you can play an important role.  Because skin is not sterile, your skin needs to be as free of germs as possible.  You can reduce the number of germs on your skin by washing with CHG (chlorahexidine gluconate) soap before surgery.  CHG is an antiseptic cleaner which kills germs and bonds with the skin to continue killing germs even after washing. Please DO NOT use if you have an allergy to CHG or antibacterial soaps.  If your skin becomes reddened/irritated stop using the CHG and inform your nurse when you arrive at Short Stay. Do not shave (including legs and underarms) for at least 48 hours prior to the first CHG shower.  You may shave your face/neck.  Please follow these instructions carefully:  1.  Shower with CHG Soap the night before surgery and the  morning of surgery.  2.  If you choose to wash your hair, wash your hair first as usual with your normal  shampoo.  3.  After you shampoo, rinse your hair and body thoroughly to remove the shampoo.                             4.  Use CHG as you would any other liquid soap.  You can apply chg directly to the skin and wash.  Gently with a scrungie or clean washcloth.  5.  Apply the CHG Soap to your body ONLY FROM THE NECK DOWN.   Do not use on face/ open                           Wound or open sores. Avoid contact with eyes, ears mouth and genitals (private parts).                       Wash face,  Genitals (  private parts) with your normal soap.             6.  Wash thoroughly, paying special attention to the area where your  surgery  will be performed.  7.  Thoroughly rinse your body with warm water from the neck down.  8.  DO NOT shower/wash with your normal soap after using and rinsing off the CHG Soap.            9.  Pat yourself dry with a clean towel.            10.  Wear clean pajamas.            11.  Place clean sheets on your bed the night of your first shower and do not  sleep with pets.  ON THE DAY OF SURGERY : Do not apply  any lotions/deodorants the morning of surgery.  Please wear clean clothes to the hospital/surgery center.    FAILURE TO FOLLOW THESE INSTRUCTIONS MAY RESULT IN THE CANCELLATION OF YOUR SURGERY  PATIENT SIGNATURE_________________________________  NURSE SIGNATURE__________________________________  ________________________________________________________________________

## 2021-10-31 ENCOUNTER — Ambulatory Visit: Payer: Self-pay | Admitting: Surgery

## 2021-11-01 ENCOUNTER — Encounter (HOSPITAL_COMMUNITY)
Admission: RE | Admit: 2021-11-01 | Discharge: 2021-11-01 | Disposition: A | Discharge status: Home / Self Care | Payer: Medicaid Other | Source: Ambulatory Visit | Attending: Surgery | Admitting: Surgery

## 2021-11-01 ENCOUNTER — Encounter (HOSPITAL_COMMUNITY): Payer: Self-pay

## 2021-11-01 ENCOUNTER — Other Ambulatory Visit: Payer: Self-pay

## 2021-11-01 VITALS — BP 133/80 | HR 72 | Temp 98.7°F | Resp 16 | Ht 69.0 in | Wt 265.0 lb

## 2021-11-01 DIAGNOSIS — L0591 Pilonidal cyst without abscess: Secondary | ICD-10-CM | POA: Diagnosis not present

## 2021-11-01 DIAGNOSIS — E119 Type 2 diabetes mellitus without complications: Secondary | ICD-10-CM | POA: Diagnosis not present

## 2021-11-01 DIAGNOSIS — Z01812 Encounter for preprocedural laboratory examination: Secondary | ICD-10-CM | POA: Diagnosis present

## 2021-11-01 DIAGNOSIS — F17200 Nicotine dependence, unspecified, uncomplicated: Secondary | ICD-10-CM | POA: Insufficient documentation

## 2021-11-01 DIAGNOSIS — Z01818 Encounter for other preprocedural examination: Secondary | ICD-10-CM

## 2021-11-01 HISTORY — DX: Bipolar disorder, unspecified: F31.9

## 2021-11-01 LAB — BASIC METABOLIC PANEL
Anion gap: 9 (ref 5–15)
BUN: 10 mg/dL (ref 6–20)
CO2: 24 mmol/L (ref 22–32)
Calcium: 8.9 mg/dL (ref 8.9–10.3)
Chloride: 105 mmol/L (ref 98–111)
Creatinine, Ser: 0.98 mg/dL (ref 0.61–1.24)
GFR, Estimated: >60 mL/min (ref >60)
Glucose, Bld: 178 mg/dL — ABNORMAL HIGH (ref 70–99)
Potassium: 3.9 mmol/L (ref 3.5–5.1)
Sodium: 138 mmol/L (ref 135–145)

## 2021-11-01 LAB — CBC
HCT: 43.1 % (ref 39.0–52.0)
Hemoglobin: 14 g/dL (ref 13.0–17.0)
MCH: 30.2 pg (ref 26.0–34.0)
MCHC: 32.5 g/dL (ref 30.0–36.0)
MCV: 93.1 fL (ref 80.0–100.0)
Platelets: 207 10*3/uL (ref 150–400)
RBC: 4.63 MIL/uL (ref 4.22–5.81)
RDW: 13.4 % (ref 11.5–15.5)
WBC: 6.6 10*3/uL (ref 4.0–10.5)
nRBC: 0 % (ref 0.0–0.2)

## 2021-11-01 LAB — HEMOGLOBIN A1C
Hgb A1c MFr Bld: 6.1 % — ABNORMAL HIGH (ref 4.8–5.6)
Mean Plasma Glucose: 128.37 mg/dL

## 2021-11-01 LAB — GLUCOSE, CAPILLARY: Glucose-Capillary: 192 mg/dL — ABNORMAL HIGH (ref 70–99)

## 2021-11-02 NOTE — Progress Notes (Signed)
Anesthesia Chart Review   Case: [id number] Date/Time: 11/03/21 1530   Procedure: EXCISION OF PILONIDAL CYST   Anesthesia type: General   Pre-op diagnosis: PILONIDAL CYST   Location: [facility] ROOM 02 / [facility]   Surgeons: Felicie Morn, MD       DISCUSSION:51 y.o. smoker with h/o DM II, pilonidal cyst scheduled for above procedure 11/03/2021 with Dr. Louanna Raw.   Pt last seen by cardiology 10/09/2021 for preoperative evaluation.  Per OV note, "Preoperative Cardiovascular Risk Assessment: Patient is unable to achieve > 4 METS activity due to difficulty walking more than a few steps without back pain. He uses a cane and states the longest distance he can walk is from inside his apartment to his vehicle. Therefore, he will require nuclear stress testing for clearance to proceed with surgery under general anesthesia.  I have notified [place], Hassan Rowan of the situation and she asked me to notify her once patient is cleared."  Low risk stress test 10/17/2021.   Anticipate pt can proceed with planned procedure barring acute status change.   VS: BP 133/80 Comment: right arm sitting  Pulse 72   Temp 37.1 C (Oral)   Resp 16   Ht 5\' 9"  (1.753 m)   Wt 120.2 kg   SpO2 99%   BMI 39.13 kg/m   PROVIDERS: [facility] [place]  Primary Cardiologist:  Early Osmond, MD LABS: Labs reviewed: Acceptable for surgery. (all labs ordered are listed, but only abnormal results are displayed)  Labs Reviewed  HEMOGLOBIN A1C - Abnormal; Notable for the following components:      Result Value   Hgb A1c MFr Bld 6.1 (*)    All other components within normal limits  BASIC METABOLIC PANEL - Abnormal; Notable for the following components:   Glucose, Bld 178 (*)    All other components within normal limits  GLUCOSE, CAPILLARY - Abnormal; Notable for the following components:   Glucose-Capillary 192 (*)    All other components within normal limits  CBC      IMAGES:   EKG:   CV: Myocardial Perfusion 10/17/2021   The study is normal. The study is low risk.   Normal blood pressure and normal heart rate response noted during stress. Heart rate recovery was normal.   Baseline EKG demonstrates nonspecific T wave abnormality and No change in EKG with infusion from baseline.   LV perfusion is normal. There is no evidence of ischemia. There is no evidence of infarction.   Left ventricular function is normal. Nuclear stress EF: 59 %. The left ventricular ejection fraction is normal (55-65%). End diastolic cavity size is normal. End systolic cavity size is normal.   Prior study not available for comparison.  Echo 07/26/2021 1. Left ventricular ejection fraction, by estimation, is 60 to 65%. The  left ventricle has normal function. The left ventricle has no regional  wall motion abnormalities. There is mild left ventricular hypertrophy.  Left ventricular diastolic parameters  were normal.   2. Right ventricular systolic function is normal. The right ventricular  size is normal. Tricuspid regurgitation signal is inadequate for assessing  PA pressure.   3. The mitral valve is normal in structure. No evidence of mitral valve  regurgitation.   4. The aortic valve is tricuspid. Aortic valve regurgitation is not  visualized.   5. The inferior vena cava is normal in size with greater than 50%  respiratory variability, suggesting right atrial pressure of 3 mmHg.  Past Medical  History:  Diagnosis Date   ACL tear    left leg   Bipolar disorder (HCC)    Chronic pain    Complication of anesthesia    Prolonged sedation   Diabetes mellitus without complication (HCC)    Edema    Headache(784.0)    Hemorrhoids    Insomnia due to medical condition    Psychosis (HCC)    Schizophrenia (HCC)    Weakness    occasionally in both arms and hands;related to neck issues    Past Surgical History:  Procedure Laterality Date   HARDWARE REMOVAL N/A  06/24/2013   Procedure: EXPLORATION/HARDWARE REMOVAL CERVICAL FOUR-FIVE, CERVICAL FIVE-SIX, AND CERVICAL SIX-SEVEN.;  Surgeon: Mariam Dollar, MD;  Location: [facility];  Service: Neurosurgery;  Laterality: N/A;  posteriorpt. states hardware was not removed   POSTERIOR CERVICAL FUSION/FORAMINOTOMY N/A 11/19/2012   Procedure: CERVICAL TWO TO CERVICAL SEVEN POSTERIOR CERVICAL FUSION/FORAMINOTOMY LEVEL 5;  Surgeon: Mariam Dollar, MD;  Location: [facility];  Service: Neurosurgery;  Laterality: N/A;  C2-7 posteior cervical arthrodesis with instrumentation   SHOULDER ARTHROSCOPY WITH ROTATOR CUFF REPAIR AND SUBACROMIAL DECOMPRESSION Left 04/20/2020   Procedure: SHOULDER ARTHROSCOPY WITH MINI OPEN  ROTATOR CUFF REPAIR AND SUBACROMIAL DECOMPRESSION;  Surgeon: Jene Every, MD;  Location: [facility];  Service: Orthopedics;  Laterality: Left;  90 MINS    MEDICATIONS:  acetaminophen (TYLENOL) 500 MG tablet   atorvastatin (LIPITOR) 10 MG tablet   baclofen (LIORESAL) 10 MG tablet   Bioflavonoid Products (VITAMIN C) CHEW   docusate sodium (COLACE) 100 MG capsule   furosemide (LASIX) 20 MG tablet   metFORMIN (GLUCOPHAGE) 500 MG tablet   naloxone (NARCAN) nasal spray 4 mg/0.1 mL   Oxycodone HCl 10 MG TABS   Vitamin D, Ergocalciferol, (DRISDOL) 50000 units CAPS capsule   No current facility-administered medications for this encounter.     Jodell Cipro Ward, PA-C [facility] [phone]

## 2021-11-02 NOTE — Anesthesia Preprocedure Evaluation (Addendum)
Anesthesia Evaluation  Patient identified by MRN, date of birth, ID band Patient awake    Reviewed: Allergy & Precautions, NPO status , Patient's Chart, lab work & pertinent test results  Airway Mallampati: III  TM Distance: >3 FB Neck ROM: Full    Dental  (+) Teeth Intact, Dental Advisory Given   Pulmonary Current Smoker and Patient abstained from smoking.,    breath sounds clear to auscultation       Cardiovascular negative cardio ROS   Rhythm:Regular Rate:Normal  Gated Stress Test: .  The study is normal. The study is low risk. .  Normal blood pressure and normal heart rate response noted during stress. Heart rate recovery was normal. .  Baseline EKG demonstrates nonspecific T wave abnormality and No change in EKG with infusion from baseline. .  LV perfusion is normal. There is no evidence of ischemia. There is no evidence of infarction. .  Left ventricular function is normal. Nuclear stress EF: 59 %. The left ventricular ejection fraction is normal (55-65%). End diastolic cavity size is normal. End systolic cavity size is normal. .  Prior study not available for comparison.   Echo: 1. Left ventricular ejection fraction, by estimation, is 60 to 65%. The  left ventricle has normal function. The left ventricle has no regional  wall motion abnormalities. There is mild left ventricular hypertrophy.  Left ventricular diastolic parameters  were normal.  2. Right ventricular systolic function is normal. The right ventricular  size is normal. Tricuspid regurgitation signal is inadequate for assessing  PA pressure.  3. The mitral valve is normal in structure. No evidence of mitral valve  regurgitation.  4. The aortic valve is tricuspid. Aortic valve regurgitation is not  visualized.  5. The inferior vena cava is normal in size with greater than 50%  respiratory variability, suggesting right atrial pressure of 3 mmHg.     Neuro/Psych  Headaches, PSYCHIATRIC DISORDERS Bipolar Disorder Schizophrenia    GI/Hepatic negative GI ROS, Neg liver ROS,   Endo/Other  diabetes, Type 2, Oral Hypoglycemic Agents  Renal/GU negative Renal ROS     Musculoskeletal  (+) Arthritis ,   Abdominal Normal abdominal exam  (+)   Peds  Hematology negative hematology ROS (+)   Anesthesia Other Findings   Reproductive/Obstetrics                           Anesthesia Physical Anesthesia Plan  ASA: 3  Anesthesia Plan: General   Post-op Pain Management:    Induction: Intravenous  PONV Risk Score and Plan: 2 and Ondansetron and Midazolam  Airway Management Planned: Oral ETT  Additional Equipment: None  Intra-op Plan:   Post-operative Plan: Extubation in OR  Informed Consent: I have reviewed the patients History and Physical, chart, labs and discussed the procedure including the risks, benefits and alternatives for the proposed anesthesia with the patient or authorized representative who has indicated his/her understanding and acceptance.     Dental advisory given  Plan Discussed with: CRNA  Anesthesia Plan Comments: (See PAT note 11/01/2021)      Anesthesia Quick Evaluation

## 2021-11-03 ENCOUNTER — Encounter (HOSPITAL_COMMUNITY): Payer: Self-pay | Admitting: Surgery

## 2021-11-03 ENCOUNTER — Ambulatory Visit (HOSPITAL_COMMUNITY): Payer: Medicaid Other | Admitting: Physician Assistant

## 2021-11-03 ENCOUNTER — Encounter (HOSPITAL_COMMUNITY)
Admission: RE | Disposition: A | Discharge status: Home / Self Care | Payer: Self-pay | Source: Ambulatory Visit | Attending: Surgery

## 2021-11-03 ENCOUNTER — Ambulatory Visit (HOSPITAL_COMMUNITY)
Admission: RE | Admit: 2021-11-03 | Discharge: 2021-11-03 | Disposition: A | Discharge status: Home / Self Care | Payer: Medicaid Other | Source: Ambulatory Visit | Attending: Surgery | Admitting: Surgery

## 2021-11-03 ENCOUNTER — Ambulatory Visit (HOSPITAL_BASED_OUTPATIENT_CLINIC_OR_DEPARTMENT_OTHER): Payer: Medicaid Other | Admitting: Anesthesiology

## 2021-11-03 DIAGNOSIS — L0591 Pilonidal cyst without abscess: Secondary | ICD-10-CM | POA: Diagnosis present

## 2021-11-03 DIAGNOSIS — F17210 Nicotine dependence, cigarettes, uncomplicated: Secondary | ICD-10-CM | POA: Insufficient documentation

## 2021-11-03 DIAGNOSIS — Z7984 Long term (current) use of oral hypoglycemic drugs: Secondary | ICD-10-CM

## 2021-11-03 DIAGNOSIS — E119 Type 2 diabetes mellitus without complications: Secondary | ICD-10-CM | POA: Diagnosis not present

## 2021-11-03 DIAGNOSIS — M1990 Unspecified osteoarthritis, unspecified site: Secondary | ICD-10-CM

## 2021-11-03 HISTORY — PX: PILONIDAL CYST EXCISION: SHX744

## 2021-11-03 LAB — GLUCOSE, CAPILLARY
Glucose-Capillary: 106 mg/dL — ABNORMAL HIGH (ref 70–99)
Glucose-Capillary: 89 mg/dL (ref 70–99)

## 2021-11-03 SURGERY — EXCISION, SIMPLE PILONIDAL CYST
Anesthesia: General | Site: Buttocks

## 2021-11-03 MED ORDER — CHLORHEXIDINE GLUCONATE 0.12 % MT SOLN
15.0000 mL | Freq: Once | OROMUCOSAL | Status: AC
  Administered 2021-11-03: 15 mL via OROMUCOSAL

## 2021-11-03 MED ORDER — KETOROLAC TROMETHAMINE 15 MG/ML IJ SOLN
15.0000 mg | INTRAMUSCULAR | Status: AC
  Administered 2021-11-03: 15 mg via INTRAVENOUS
  Filled 2021-11-03: qty 1

## 2021-11-03 MED ORDER — PROMETHAZINE HCL 25 MG/ML IJ SOLN: 6.2500 mg | INTRAMUSCULAR | Status: DC | PRN

## 2021-11-03 MED ORDER — LIDOCAINE 2% (20 MG/ML) 5 ML SYRINGE
INTRAMUSCULAR | Status: DC | PRN
  Administered 2021-11-03: 60 mg via INTRAVENOUS

## 2021-11-03 MED ORDER — LIDOCAINE HCL (PF) 2 % IJ SOLN
INTRAMUSCULAR | Status: AC
  Filled 2021-11-03: qty 5

## 2021-11-03 MED ORDER — ACETAMINOPHEN 10 MG/ML IV SOLN: 1000.0000 mg | Freq: Once | INTRAVENOUS | Status: DC | PRN

## 2021-11-03 MED ORDER — PROPOFOL 10 MG/ML IV BOLUS
INTRAVENOUS | Status: AC
  Filled 2021-11-03: qty 20

## 2021-11-03 MED ORDER — MIDAZOLAM HCL 2 MG/2ML IJ SOLN
INTRAMUSCULAR | Status: DC | PRN
  Administered 2021-11-03: 2 mg via INTRAVENOUS

## 2021-11-03 MED ORDER — MIDAZOLAM HCL 2 MG/2ML IJ SOLN
INTRAMUSCULAR | Status: AC
  Filled 2021-11-03: qty 2

## 2021-11-03 MED ORDER — CEFAZOLIN IN SODIUM CHLORIDE 3-0.9 GM/100ML-% IV SOLN
3.0000 g | INTRAVENOUS | Status: AC
  Administered 2021-11-03: 3 g via INTRAVENOUS
  Filled 2021-11-03: qty 100

## 2021-11-03 MED ORDER — PHENYLEPHRINE 80 MCG/ML (10ML) SYRINGE FOR IV PUSH (FOR BLOOD PRESSURE SUPPORT)
PREFILLED_SYRINGE | INTRAVENOUS | Status: AC
  Filled 2021-11-03: qty 10

## 2021-11-03 MED ORDER — FENTANYL CITRATE (PF) 250 MCG/5ML IJ SOLN
INTRAMUSCULAR | Status: DC | PRN
  Administered 2021-11-03: 100 ug via INTRAVENOUS

## 2021-11-03 MED ORDER — ACETAMINOPHEN 160 MG/5ML PO SOLN: 325.0000 mg | ORAL | Status: DC | PRN

## 2021-11-03 MED ORDER — GABAPENTIN 300 MG PO CAPS
300.0000 mg | ORAL_CAPSULE | ORAL | Status: AC
  Administered 2021-11-03: 300 mg via ORAL
  Filled 2021-11-03: qty 1

## 2021-11-03 MED ORDER — ACETAMINOPHEN 500 MG PO TABS
1000.0000 mg | ORAL_TABLET | ORAL | Status: AC
  Administered 2021-11-03: 1000 mg via ORAL
  Filled 2021-11-03: qty 2

## 2021-11-03 MED ORDER — FENTANYL CITRATE (PF) 250 MCG/5ML IJ SOLN
INTRAMUSCULAR | Status: AC
  Filled 2021-11-03: qty 5

## 2021-11-03 MED ORDER — 0.9 % SODIUM CHLORIDE (POUR BTL) OPTIME
TOPICAL | Status: DC | PRN
  Administered 2021-11-03: 1000 mL

## 2021-11-03 MED ORDER — OXYCODONE HCL 5 MG PO TABS: 5.0000 mg | ORAL_TABLET | Freq: Once | ORAL | Status: DC | PRN

## 2021-11-03 MED ORDER — ROCURONIUM BROMIDE 10 MG/ML (PF) SYRINGE
PREFILLED_SYRINGE | INTRAVENOUS | Status: AC
  Filled 2021-11-03: qty 10

## 2021-11-03 MED ORDER — ACETAMINOPHEN 325 MG PO TABS: 325.0000 mg | ORAL_TABLET | ORAL | Status: DC | PRN

## 2021-11-03 MED ORDER — OXYCODONE-ACETAMINOPHEN 5-325 MG PO TABS: 1.0000 | ORAL_TABLET | ORAL | 0 refills | Status: AC | PRN

## 2021-11-03 MED ORDER — ONDANSETRON HCL 4 MG/2ML IJ SOLN
INTRAMUSCULAR | Status: AC
  Filled 2021-11-03: qty 2

## 2021-11-03 MED ORDER — FENTANYL CITRATE PF 50 MCG/ML IJ SOSY: 25.0000 ug | PREFILLED_SYRINGE | INTRAMUSCULAR | Status: DC | PRN

## 2021-11-03 MED ORDER — LACTATED RINGERS IV SOLN: INTRAVENOUS | Status: DC

## 2021-11-03 MED ORDER — BUPIVACAINE LIPOSOME 1.3 % IJ SUSP
INTRAMUSCULAR | Status: AC
  Filled 2021-11-03: qty 20

## 2021-11-03 MED ORDER — AMISULPRIDE (ANTIEMETIC) 5 MG/2ML IV SOLN: 10.0000 mg | Freq: Once | INTRAVENOUS | Status: DC | PRN

## 2021-11-03 MED ORDER — CHLORHEXIDINE GLUCONATE CLOTH 2 % EX PADS: 6.0000 | MEDICATED_PAD | Freq: Once | CUTANEOUS | Status: DC

## 2021-11-03 MED ORDER — SUCCINYLCHOLINE CHLORIDE 200 MG/10ML IV SOSY
PREFILLED_SYRINGE | INTRAVENOUS | Status: DC | PRN
  Administered 2021-11-03: 140 mg via INTRAVENOUS

## 2021-11-03 MED ORDER — BUPIVACAINE LIPOSOME 1.3 % IJ SUSP
INTRAMUSCULAR | Status: DC | PRN
  Administered 2021-11-03: 20 mL

## 2021-11-03 MED ORDER — OXYCODONE HCL 5 MG/5ML PO SOLN: 5.0000 mg | Freq: Once | ORAL | Status: DC | PRN

## 2021-11-03 MED ORDER — PROPOFOL 10 MG/ML IV BOLUS
INTRAVENOUS | Status: DC | PRN
  Administered 2021-11-03: 200 mg via INTRAVENOUS

## 2021-11-03 MED ORDER — PHENYLEPHRINE 80 MCG/ML (10ML) SYRINGE FOR IV PUSH (FOR BLOOD PRESSURE SUPPORT)
PREFILLED_SYRINGE | INTRAVENOUS | Status: DC | PRN
  Administered 2021-11-03: 160 ug via INTRAVENOUS

## 2021-11-03 MED ORDER — ONDANSETRON HCL 4 MG/2ML IJ SOLN
INTRAMUSCULAR | Status: DC | PRN
  Administered 2021-11-03: 4 mg via INTRAVENOUS

## 2021-11-03 MED ORDER — ORAL CARE MOUTH RINSE: 15.0000 mL | Freq: Once | OROMUCOSAL | Status: AC

## 2021-11-03 SURGICAL SUPPLY — 31 items
ADH SKN CLS APL DERMABOND .7 (GAUZE/BANDAGES/DRESSINGS)
BAG COUNTER SPONGE SURGICOUNT (BAG) IMPLANT
BAG SPNG CNTER NS LX DISP (BAG)
BNDG GAUZE DERMACEA FLUFF 4 (GAUZE/BANDAGES/DRESSINGS) IMPLANT
BNDG GZE DERMACEA 4 6PLY (GAUZE/BANDAGES/DRESSINGS) ×1
BRIEF MESH DISP LRG (UNDERPADS AND DIAPERS) ×2 IMPLANT
COVER SURGICAL LIGHT HANDLE (MISCELLANEOUS) ×2 IMPLANT
DERMABOND ADVANCED .7 DNX12 (GAUZE/BANDAGES/DRESSINGS) IMPLANT
DRAPE LAPAROTOMY T 102X78X121 (DRAPES) ×2 IMPLANT
ELECT REM PT RETURN 15FT ADLT (MISCELLANEOUS) ×2 IMPLANT
GAUZE PAD ABD 7.5X8 STRL (GAUZE/BANDAGES/DRESSINGS) IMPLANT
GAUZE PAD ABD 8X10 STRL (GAUZE/BANDAGES/DRESSINGS) ×2 IMPLANT
GAUZE SPONGE 4X4 12PLY STRL (GAUZE/BANDAGES/DRESSINGS) ×2 IMPLANT
GLOVE SURG LX STRL 8.0 MICRO (GLOVE) ×2 IMPLANT
GOWN STRL REUS W/ TWL XL LVL3 (GOWN DISPOSABLE) ×4 IMPLANT
GOWN STRL REUS W/TWL XL LVL3 (GOWN DISPOSABLE) ×2
KIT BASIN OR (CUSTOM PROCEDURE TRAY) ×2 IMPLANT
KIT TURNOVER KIT A (KITS) IMPLANT
NEEDLE HYPO 22GX1.5 SAFETY (NEEDLE) ×2 IMPLANT
PACK BASIC VI WITH GOWN DISP (CUSTOM PROCEDURE TRAY) ×2 IMPLANT
PACKING GAUZE IODOFORM 1INX5YD (GAUZE/BANDAGES/DRESSINGS) IMPLANT
PENCIL SMOKE EVACUATOR (MISCELLANEOUS) IMPLANT
SCRUB TECHNI CARE 4 OZ NO DYE (MISCELLANEOUS) ×2 IMPLANT
SUT MNCRL AB 4-0 PS2 18 (SUTURE) IMPLANT
SUT PROLENE 2 0 SH DA (SUTURE) IMPLANT
SUT VIC AB 2-0 SH 18 (SUTURE) ×2 IMPLANT
SYR 20ML LL LF (SYRINGE) ×2 IMPLANT
TAPE CLOTH SURG 4X10 WHT LF (GAUZE/BANDAGES/DRESSINGS) IMPLANT
TOWEL OR 17X26 10 PK STRL BLUE (TOWEL DISPOSABLE) ×2 IMPLANT
TOWEL OR NON WOVEN STRL DISP B (DISPOSABLE) ×2 IMPLANT
YANKAUER SUCT BULB TIP 10FT TU (MISCELLANEOUS) IMPLANT

## 2021-11-03 NOTE — Anesthesia Procedure Notes (Signed)
Procedure Name: Intubation Date/Time: 11/03/2021 3:23 PM  Performed by: Lollie Sails, CRNAPre-anesthesia Checklist: Patient identified, Emergency Drugs available, Suction available, Patient being monitored and Timeout performed Patient Re-evaluated:Patient Re-evaluated prior to induction Oxygen Delivery Method: Circle system utilized Preoxygenation: Pre-oxygenation with 100% oxygen Induction Type: IV induction Laryngoscope Size: Glidescope and 4 Grade View: Grade II Tube type: Oral Tube size: 7.5 mm Number of attempts: 1 Airway Equipment and Method: Rigid stylet and Video-laryngoscopy Placement Confirmation: ETT inserted through vocal cords under direct vision, positive ETCO2 and breath sounds checked- equal and bilateral Secured at: 24 cm Tube secured with: Tape Dental Injury: Teeth and Oropharynx as per pre-operative assessment  Difficulty Due To: Difficulty was anticipated and Difficult Airway- due to reduced neck mobility Comments: Minimal extension to neck.   Elective glidescope intubation performed.

## 2021-11-03 NOTE — Op Note (Signed)
   Patient: Roger Woods (Jun 04, 1970, [id number])  Date of Surgery: 11/03/2021   Preoperative Diagnosis: PILONIDAL CYST   Postoperative Diagnosis: PILONIDAL CYST   Surgical Procedure: EXCISION OF PILONIDAL CYST:    Operative Team Members:  Surgeon(s) and Role:    * Marwa Fuhrman, Nickola Major, MD - Primary   Anesthesiologist: Effie Berkshire, MD CRNA: Lollie Sails, CRNA   Anesthesia: General   Fluids:  Total I/O In: 100 [IV [id number]] Out: -   Complications: (1) Difficult to intubate - expected  Comments: Filed from anesthesia note documentation.  Drains:  none   Specimen:  ID Type Source Tests Collected by Time Destination  1 : Pilonidal cyst Tissue Soft Tissue, Other SURGICAL PATHOLOGY Zein Helbing, Nickola Major, MD 11/03/2021 1546      Disposition:  PACU - hemodynamically stable.  Plan of Care: Discharge to home after PACU    Indications for Procedure: [name] is a 51 y.o. male who presented with a pilonidal cyst.  I recommended pilonidal cyst excision. I described the procedure as well as its risk, benefits, and alternatives. I explained how I usually try to perform a flap type procedure for faster wound healing, though I am concerned with his diabetes and tobacco use that he is too high of risk for a wound infection, and I recommend either just packing the wound postoperatively or placing a wound VAC if we are able to coordinate that with the social work team.  He elected to pack the wound  Findings: Pilonidal cyst track   Description of Procedure:   On this above the patient taken operating room suite general anesthesia was induced he was placed in prone positioning.  Antibiotics were given.  I made an elliptical incision around the area of pilonidal disease.  I carried the dissection down to the sacrum and excised all abnormal appearing tissue.  This was passed off the field as a specimen.  Hemostasis was obtained in the wound.  Kerlix packing and a bandage  was applied.  All sponge needle counts were correct at the end of this case.   At the end of the case we reviewed the infection status of the case. Patient: Private Patient Elective Case Case: Elective Infection Present At Time Of Surgery (PATOS): Pilonidal cyst without spillage of cyst contents  Louanna Raw, MD General, Bariatric, & Minimally Invasive Surgery [facility], [place]

## 2021-11-03 NOTE — Transfer of Care (Signed)
Immediate Anesthesia Transfer of Care Note  Patient: Roger Woods  Procedure(s) Performed: EXCISION OF PILONIDAL CYST (Buttocks)  Patient Location: PACU  Anesthesia Type:General  Level of Consciousness: awake, drowsy and responds to stimulation  Airway & Oxygen Therapy: Patient Spontanous Breathing and Patient connected to face mask oxygen  Post-op Assessment: Report given to RN and Post -op Vital signs reviewed and stable  Post vital signs: Reviewed and stable  Last Vitals:  Vitals Value Taken Time  BP 112/59 11/03/21 1608  Temp    Pulse 63 11/03/21 1608  Resp 21 11/03/21 1608  SpO2 96 % 11/03/21 1608  Vitals shown include unvalidated device data.  Last Pain:  Vitals:   11/03/21 1348  TempSrc:   PainSc: 10-Worst pain ever         Complications:  Encounter Notable Events  Notable Event Outcome Phase Comment  Difficult to intubate - expected  Intraprocedure Filed from anesthesia note documentation.

## 2021-11-03 NOTE — H&P (Signed)
Admitting Physician: Nickola Major Bridget Westbrooks  Service: General Surgery  CC: Pilonidal cyst  Subjective   HPI: Roger Woods is an 51 y.o. male who is here for pilonidal cyst excision.  Past Medical History:  Diagnosis Date   ACL tear    left leg   Bipolar disorder (HCC)    Chronic pain    Complication of anesthesia    Prolonged sedation   Diabetes mellitus without complication ([place])    Edema    Headache(784.0)    Hemorrhoids    Insomnia due to medical condition    Psychosis ([place])    Schizophrenia ([place])    Weakness    occasionally in both arms and hands;related to neck issues    Past Surgical History:  Procedure Laterality Date   HARDWARE REMOVAL N/A 06/24/2013   Procedure: EXPLORATION/HARDWARE REMOVAL CERVICAL FOUR-FIVE, CERVICAL FIVE-SIX, AND CERVICAL SIX-SEVEN.;  Surgeon: Elaina Hoops, MD;  Location: [place] [facility];  Service: Neurosurgery;  Laterality: N/A;  posteriorpt. states hardware was not removed   POSTERIOR CERVICAL FUSION/FORAMINOTOMY N/A 11/19/2012   Procedure: CERVICAL TWO TO CERVICAL SEVEN POSTERIOR CERVICAL FUSION/FORAMINOTOMY LEVEL 5;  Surgeon: Elaina Hoops, MD;  Location: [facility];  Service: Neurosurgery;  Laterality: N/A;  C2-7 posteior cervical arthrodesis with instrumentation   SHOULDER ARTHROSCOPY WITH ROTATOR CUFF REPAIR AND SUBACROMIAL DECOMPRESSION Left 04/20/2020   Procedure: SHOULDER ARTHROSCOPY WITH MINI OPEN  ROTATOR CUFF REPAIR AND SUBACROMIAL DECOMPRESSION;  Surgeon: Susa Day, MD;  Location[facility];  Service: Orthopedics;  Laterality: Left;  45 MINS    Family History  Problem Relation Age of Onset   High blood pressure Maternal Grandmother    Cancer Other     Social:  reports that he has been smoking cigarettes. He has a 7.00 pack-year smoking history. He has never used smokeless tobacco. He reports that he does not currently use alcohol. He reports that he does not use drugs.  Allergies:  Allergies  Allergen Reactions    Hydrocodone Itching and Other (See Comments)    Throat itching    Penicillins Other (See Comments)    Unknown reaction Has patient had a PCN reaction causing immediate rash, facial/tongue/throat swelling, SOB or lightheadedness with hypotension: Yes Has patient had a PCN reaction causing severe rash involving mucus membranes or skin necrosis: Yes Has patient had a PCN reaction that required hospitalization No Has patient had a PCN reaction occurring within the last 10 years: No If all of the above answers are "NO", then may proceed with Cephalosporin use.     Medications: Current Outpatient Medications  Medication Instructions   acetaminophen (TYLENOL) 500-1,000 mg, Oral, Every 6 hours PRN   atorvastatin (LIPITOR) 10 mg, Oral, Daily at bedtime   baclofen (LIORESAL) 10 mg, Oral, Daily PRN   Bioflavonoid Products (VITAMIN C) CHEW 1 tablet, Oral, Every morning   docusate sodium (COLACE) 100 mg, Oral, Daily PRN   furosemide (LASIX) 20 mg, Oral, Daily   metFORMIN (GLUCOPHAGE) 500 mg, Oral, Every morning   naloxone (NARCAN) 0.4 mg, Nasal, As needed   Oxycodone HCl 10 mg, Oral, 5 times daily   Vitamin D (Ergocalciferol) (DRISDOL) 50,000 Units, Oral, Every Mon    ROS - all of the below systems have been reviewed with the patient and positives are indicated with bold text General: chills, fever or night sweats Eyes: blurry vision or double vision ENT: epistaxis or sore throat Allergy/Immunology: itchy/watery eyes or nasal congestion Hematologic/Lymphatic: bleeding problems, blood clots or swollen lymph nodes  Endocrine: temperature intolerance or unexpected weight changes Breast: new or changing breast lumps or nipple discharge Resp: cough, shortness of breath, or wheezing CV: chest pain or dyspnea on exertion GI: as per HPI GU: dysuria, trouble voiding, or hematuria MSK: joint pain or joint stiffness Neuro: TIA or stroke symptoms Derm: pruritus and skin lesion changes Psych: anxiety  and depression  Objective   PE Blood pressure 124/66, pulse 72, temperature 98.1 F (36.7 C), temperature source Oral, resp. rate 16, height 5\' 9"  (1.753 m), weight 120.2 kg, SpO2 98 %. Constitutional: NAD; conversant; no deformities Eyes: Moist conjunctiva; no lid lag; anicteric; PERRL Neck: Trachea midline; no thyromegaly Lungs: Normal respiratory effort; no tactile fremitus CV: RRR; no palpable thrills; no pitting edema GI: Abd Pilonidal cyst; no palpable hepatosplenomegaly MSK: Normal range of motion of extremities; no clubbing/cyanosis Psychiatric: Appropriate affect; alert and oriented x3 Lymphatic: No palpable cervical or axillary lymphadenopathy  Results for orders placed or performed during the hospital encounter of 11/03/21 (from the past 24 hour(s))  Glucose, capillary     Status: Abnormal   Collection Time: 11/03/21  1:42 PM  Result Value Ref Range   Glucose-Capillary 106 (H) 70 - 99 mg/dL   Comment 1 Notify RN    Comment 2 Document in Chart     Imaging Orders  No imaging studies ordered today     Assessment and Plan   Roger Woods is an 51 y.o. male with a pilonidal cyst.     Pilonidal cyst   I recommended pilonidal cyst excision. I described the procedure as well as its risk, benefits, and alternatives. I explained how I usually try to perform a flap type procedure for faster wound healing, though I am concerned with his diabetes and tobacco use that he is too high of risk for a wound infection, and I recommend either just packing the wound postoperatively or placing a wound VAC if we are able to coordinate that with the social work team.    Felicie Morn, MD  [facility] Use [email or web address] to contact on call provider

## 2021-11-04 ENCOUNTER — Encounter (HOSPITAL_COMMUNITY): Payer: Self-pay | Admitting: Surgery

## 2021-11-04 NOTE — Anesthesia Postprocedure Evaluation (Signed)
Anesthesia Post Note  Patient: [name]  Procedure(s) Performed: EXCISION OF PILONIDAL CYST (Buttocks)     Patient location during evaluation: PACU Anesthesia Type: General Level of consciousness: awake and alert Pain management: pain level controlled Vital Signs Assessment: post-procedure vital signs reviewed and stable Respiratory status: spontaneous breathing, nonlabored ventilation, respiratory function stable and patient connected to nasal cannula oxygen Cardiovascular status: blood pressure returned to baseline and stable Postop Assessment: no apparent nausea or vomiting Anesthetic complications: yes   Encounter Notable Events  Notable Event Outcome Phase Comment  Difficult to intubate - expected  Intraprocedure Filed from anesthesia note documentation.    Last Vitals:  Vitals:   11/03/21 1622 11/03/21 1630  BP:  127/83  Pulse: 66 72  Resp: 14   Temp:  36.7 C  SpO2: 100% 100%    Last Pain:  Vitals:   11/03/21 1630  TempSrc:   PainSc: 0-No pain                 Effie Berkshire

## 2021-11-06 ENCOUNTER — Other Ambulatory Visit: Payer: Self-pay

## 2021-11-06 ENCOUNTER — Other Ambulatory Visit (HOSPITAL_COMMUNITY): Payer: Self-pay

## 2021-11-06 MED ORDER — OXYCODONE-ACETAMINOPHEN 5-325 MG PO TABS
1.0000 | ORAL_TABLET | ORAL | 0 refills | Status: DC | PRN
  Filled 2021-11-06: qty 20, 4d supply, fill #0

## 2021-11-08 LAB — SURGICAL PATHOLOGY

## 2021-11-22 ENCOUNTER — Other Ambulatory Visit (HOSPITAL_COMMUNITY): Payer: Self-pay

## 2021-11-22 MED ORDER — OXYCODONE-ACETAMINOPHEN 5-325 MG PO TABS
1.0000 | ORAL_TABLET | Freq: Four times a day (QID) | ORAL | 0 refills | Status: DC | PRN
  Filled 2021-11-22: qty 20, 5d supply, fill #0

## 2021-12-07 ENCOUNTER — Other Ambulatory Visit (HOSPITAL_COMMUNITY): Payer: Self-pay

## 2021-12-07 MED ORDER — OXYCODONE-ACETAMINOPHEN 5-325 MG PO TABS
1.0000 | ORAL_TABLET | Freq: Four times a day (QID) | ORAL | 0 refills | Status: DC | PRN
  Filled 2021-12-07: qty 20, 5d supply, fill #0

## 2021-12-20 ENCOUNTER — Other Ambulatory Visit (HOSPITAL_COMMUNITY): Payer: Self-pay

## 2021-12-20 MED ORDER — OXYCODONE-ACETAMINOPHEN 5-325 MG PO TABS
1.0000 | ORAL_TABLET | Freq: Four times a day (QID) | ORAL | 0 refills | Status: DC | PRN
  Filled 2021-12-20: qty 20, 5d supply, fill #0

## 2022-02-08 ENCOUNTER — Emergency Department (HOSPITAL_COMMUNITY): Payer: No Typology Code available for payment source

## 2022-02-08 ENCOUNTER — Emergency Department (HOSPITAL_COMMUNITY)
Admission: EM | Admit: 2022-02-08 | Discharge: 2022-02-09 | Disposition: A | Discharge status: Home / Self Care | Payer: No Typology Code available for payment source | Attending: Emergency Medicine | Admitting: Emergency Medicine

## 2022-02-08 ENCOUNTER — Encounter (HOSPITAL_COMMUNITY): Payer: Self-pay | Admitting: *Deleted

## 2022-02-08 ENCOUNTER — Other Ambulatory Visit: Payer: Self-pay

## 2022-02-08 DIAGNOSIS — S39012A Strain of muscle, fascia and tendon of lower back, initial encounter: Secondary | ICD-10-CM | POA: Insufficient documentation

## 2022-02-08 DIAGNOSIS — S29019A Strain of muscle and tendon of unspecified wall of thorax, initial encounter: Secondary | ICD-10-CM

## 2022-02-08 DIAGNOSIS — S29012A Strain of muscle and tendon of back wall of thorax, initial encounter: Secondary | ICD-10-CM | POA: Insufficient documentation

## 2022-02-08 DIAGNOSIS — Y92410 Unspecified street and highway as the place of occurrence of the external cause: Secondary | ICD-10-CM | POA: Insufficient documentation

## 2022-02-08 DIAGNOSIS — S298XXA Other specified injuries of thorax, initial encounter: Secondary | ICD-10-CM

## 2022-02-08 DIAGNOSIS — Z7984 Long term (current) use of oral hypoglycemic drugs: Secondary | ICD-10-CM | POA: Diagnosis not present

## 2022-02-08 DIAGNOSIS — S3992XA Unspecified injury of lower back, initial encounter: Secondary | ICD-10-CM | POA: Diagnosis present

## 2022-02-08 DIAGNOSIS — E119 Type 2 diabetes mellitus without complications: Secondary | ICD-10-CM | POA: Insufficient documentation
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
## 2022-02-09 NOTE — Discharge Instructions (Signed)
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
## 2022-02-26 ENCOUNTER — Ambulatory Visit: Payer: Medicaid Other | Attending: Internal Medicine | Admitting: Physical Therapy

## 2022-02-26 NOTE — Therapy (Deleted)
OUTPATIENT PHYSICAL THERAPY LOWER EXTREMITY EVALUATION  Patient Name: Roger Woods MRN: [id number] DOB:[birth year], 52 y.o., male Today's Date: 02/26/2022    Past Medical History:  Diagnosis Date   ACL tear    left leg   Bipolar disorder (HCC)    Chronic pain    Complication of anesthesia    Prolonged sedation   Diabetes mellitus without complication ([place])    Edema    Headache(784.0)    Hemorrhoids    Insomnia due to medical condition    Psychosis ([place])    Schizophrenia ([place])    Weakness    occasionally in both arms and hands;related to neck issues   Past Surgical History:  Procedure Laterality Date   HARDWARE REMOVAL N/A 06/24/2013   Procedure: EXPLORATION/HARDWARE REMOVAL CERVICAL FOUR-FIVE, CERVICAL FIVE-SIX, AND CERVICAL SIX-SEVEN.;  Surgeon: Roger Hoops, MD;  Location: [facility];  Service: Neurosurgery;  Laterality: N/A;  posteriorpt. states hardware was not removed   PILONIDAL CYST EXCISION N/A 11/03/2021   Procedure: EXCISION OF PILONIDAL CYST;  Surgeon: Roger Morn, MD;  Location: [facility];  Service: General;  Laterality: N/A;   POSTERIOR CERVICAL FUSION/FORAMINOTOMY N/A 11/19/2012   Procedure: CERVICAL TWO TO CERVICAL SEVEN POSTERIOR CERVICAL FUSION/FORAMINOTOMY LEVEL 5;  Surgeon: Roger Hoops, MD;  Location: [facility];  Service: Neurosurgery;  Laterality: N/A;  C2-7 posteior cervical arthrodesis with instrumentation   SHOULDER ARTHROSCOPY WITH ROTATOR CUFF REPAIR AND SUBACROMIAL DECOMPRESSION Left 04/20/2020   Procedure: SHOULDER ARTHROSCOPY WITH MINI OPEN  ROTATOR CUFF REPAIR AND SUBACROMIAL DECOMPRESSION;  Surgeon: Roger Day, MD;  Location: [facility];  Service: Orthopedics;  Laterality: Left;  71 MINS   Patient Active Problem List   Diagnosis Date Noted   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  REFERRING DIAG: Ankle pain, right [M25.571]   Rationale for Evaluation and Treatment:  Rehabilitation  SUBJECTIVE:  PERTINENT PAST HISTORY:  ***        PRECAUTIONS: {Therapy precautions:24002}  WEIGHT BEARING RESTRICTIONS {Yes ***/No:24003}  FALLS:  Has patient fallen in last 6 months? {yes/no:20286}, Number of falls: ***  MOI/History of condition:  Onset date: ***  SUBJECTIVE STATEMENT  Roger Woods is a 52 y.o. male who presents to clinic with chief complaint of ***.  ***   Red flags:  {has/denies:26543} {kerredflag:26542}  Pain:  Are you having pain? {yes/no:20286} Pain location: *** NPRS scale:  {NUMBERS; 0-10:5044}/10 to {NUMBERS; 0-10:5044}/10 Aggravating factors: *** Relieving factors: *** Pain description: {PAIN DESCRIPTION:21022940} Stage: {Desc; acute/subacute/chronic:13799} Stability: {kerbetterworse:26715} 24 hour pattern: ***   Occupation: ***  Assistive Device: ***  Hand Dominance: ***  Patient Goals/Specific Activities: ***   OBJECTIVE:   DIAGNOSTIC FINDINGS:  ***   GENERAL OBSERVATION/GAIT:   ***  SENSATION:  Light touch: {intact/deficits:24005}  PALPATION: ***  MUSCLE LENGTH: Hamstrings: Right {kerminsig:27227} restriction; Left {kerminsig:27227} restriction ASLR: Right {keraslr:27228}; Left {keraslr:27228} Roger Woods test: Right {kerminsig:27227} restriction; Left {kerminsig:27227} restriction Ely's test: Right {kerminsig:27227} restriction; Left {kerminsig:27227} restriction  LE MMT:  MMT Right 02/26/2022 Left 02/26/2022  Hip flexion (L2, L3) *** ***  Knee extension (L3) *** ***  Knee flexion *** ***  Hip abduction *** ***  Hip extension *** ***  Hip external rotation    Hip internal rotation    Hip  adduction    Ankle dorsiflexion (L4) Heel walk *** Heel walk ***  Ankle plantarflexion (S1) Toe walk ***  Toe walk ***  Ankle inversion *** ***  Ankle eversion *** ***  Great Toe ext (L5)    Grossly     (Blank rows = not tested, score listed is out of 5 possible points.  N = WNL, D = diminished, C = clear for gross weakness with myotome testing, * = concordant pain with testing)  LE ROM:  ROM Right 02/26/2022 Left 02/26/2022  Hip flexion    Hip extension    Hip abduction    Hip adduction    Hip internal rotation    Hip external rotation    Knee flexion    Knee extension    Ankle dorsiflexion *** ***  Ankle plantarflexion *** ***  Ankle inversion *** ***  Ankle eversion *** ***   (Blank rows = not tested, N = WNL, * = concordant pain with testing)  Functional Tests  Eval (02/26/2022)    Progressive balance screen (highest level completed for >/= 10''):  Feet together: ***'' Semi Tandem: R in rear ***'', L in rear ***'' Tandem: R in rear ***'', L in rear ***'' SLS: R ***'', L ***''                                                           LOWER EXTREMITY SPECIAL TESTS:  Knee special tests: {KNEE SPECIAL TESTS:26240}   PATIENT SURVEYS:  {rehab surveys:24030}   TODAY'S TREATMENT: ***   PATIENT EDUCATION:  POC, diagnosis, prognosis, HEP, and outcome measures.  Pt educated via explanation, demonstration, and handout (HEP).  Pt confirms understanding verbally.   HOME EXERCISE PROGRAM: ***  ASTERISK SIGNS   Asterisk Signs Eval (02/26/2022)                                                 ASSESSMENT:  CLINICAL IMPRESSION: Roger Woods is a 52 y.o. male who presents to clinic with signs and sxs consistent with ***.    OBJECTIVE IMPAIRMENTS: Pain, ***  ACTIVITY LIMITATIONS: ***  PERSONAL FACTORS: See medical history and pertinent history   REHAB POTENTIAL: {rehabpotential:25112}  CLINICAL DECISION MAKING: {clinical decision making:25114}  EVALUATION COMPLEXITY: {Evaluation complexity:25115}   GOALS:   SHORT TERM GOALS: Target date:  03/26/2022  Roger Woods will be >75% HEP compliant to improve carryover between sessions and facilitate independent management of condition  Evaluation (02/26/2022): ongoing Goal status: INITIAL   LONG TERM GOALS: Target date: 04/23/2022  ***   2.  ***   3.  ***   4.  ***   5.  ***   6.  ***   PLAN: PT FREQUENCY: 1-2x/week  PT DURATION: 8 weeks (Ending 04/23/2022)  PLANNED INTERVENTIONS: Therapeutic exercises, Aquatic therapy, Therapeutic activity, Neuro Muscular re-education, Gait training, Patient/Family education, Joint mobilization, Dry Needling, Electrical stimulation, Spinal mobilization and/or manipulation, Moist heat, Taping, Vasopneumatic device, Ionotophoresis 76m/ml Dexamethasone, and Manual therapy  PLAN FOR NEXT SESSION: ***   KShearon BaloPT, DPT 02/26/2022, 8:46 AM

## 2022-03-21 ENCOUNTER — Ambulatory Visit: Payer: Medicaid Other | Attending: Internal Medicine

## 2022-04-10 NOTE — Therapy (Deleted)
OUTPATIENT PHYSICAL THERAPY LOWER EXTREMITY EVALUATION   Patient Name: Roger Woods MRN: [id number] DOB:09/06/1970, 52 y.o., male Today's Date: 04/10/2022  END OF SESSION:   Past Medical History:  Diagnosis Date   ACL tear    left leg   Bipolar disorder (HCC)    Chronic pain    Complication of anesthesia    Prolonged sedation   Diabetes mellitus without complication ([place])    Edema    Headache(784.0)    Hemorrhoids    Insomnia due to medical condition    Psychosis ([place])    Schizophrenia ([place])    Weakness    occasionally in both arms and hands;related to neck issues   Past Surgical History:  Procedure Laterality Date   HARDWARE REMOVAL N/A 06/24/2013   Procedure: EXPLORATION/HARDWARE REMOVAL CERVICAL FOUR-FIVE, CERVICAL FIVE-SIX, AND CERVICAL SIX-SEVEN.;  Surgeon: Roger Hoops, MD;  Location: [facility];  Service: Neurosurgery;  Laterality: N/A;  posteriorpt. states hardware was not removed   PILONIDAL CYST EXCISION N/A 11/03/2021   Procedure: EXCISION OF PILONIDAL CYST;  Surgeon: Roger Morn, MD;  Location: [facility];  Service: General;  Laterality: N/A;   POSTERIOR CERVICAL FUSION/FORAMINOTOMY N/A 11/19/2012   Procedure: CERVICAL TWO TO CERVICAL SEVEN POSTERIOR CERVICAL FUSION/FORAMINOTOMY LEVEL 5;  Surgeon: Roger Hoops, MD;  Location: [facility];  Service: Neurosurgery;  Laterality: N/A;  C2-7 posteior cervical arthrodesis with instrumentation   SHOULDER ARTHROSCOPY WITH ROTATOR CUFF REPAIR AND SUBACROMIAL DECOMPRESSION Left 04/20/2020   Procedure: SHOULDER ARTHROSCOPY WITH MINI OPEN  ROTATOR CUFF REPAIR AND SUBACROMIAL DECOMPRESSION;  Surgeon: Roger Day, MD;  Location: [facility];  Service: Orthopedics;  Laterality: Left;  58 MINS   Patient Active Problem List   Diagnosis Date Noted   Pseudoarthrosis of cervical spine 06/24/2013   Hypersomnia with sleep apnea, unspecified 04/24/2013   Narcotic drug use 04/24/2013   Spondylosis, cervical, with myelopathy  04/24/2013    PCP: [facility] PROVIDER: Wylene Simmer, MD  REFERRING DIAG: [phone] (ICD-10-CM) - Ankle pain, right  THERAPY DIAG:  No diagnosis found.  Rationale for Evaluation and Treatment: Rehabilitation  ONSET DATE: chronic  SUBJECTIVE:   SUBJECTIVE STATEMENT: (Per MD note)Roger Woods follows up today for his right ankle. He sprained it several months ago. He cannot recall exactly when. He has not had any treatment for his ankle yet. He [facility]. He complains of pain and weakness in the ankle. No physical therapy yet. No boot or brace. Epic records are reviewed and show a history of bipolar and schizophrenia. The patient reports that he does not work  PERTINENT HISTORY:  PAIN:  Are you having pain? {OPRCPAIN:27236}  PRECAUTIONS: None  WEIGHT BEARING RESTRICTIONS: No  FALLS:  Has patient fallen in last 6 months? No  OCCUPATION: not working  PLOF: Independent  PATIENT GOALS: ***  NEXT MD VISIT: ***  OBJECTIVE:   DIAGNOSTIC FINDINGS: ***  PATIENT SURVEYS:  LEFS ***  MUSCLE LENGTH: Hamstrings: Right *** deg; Left *** deg Thomas test: Right *** deg; Left *** deg  POSTURE: {posture:25561}  PALPATION: ***  LOWER EXTREMITY ROM:  {AROM/PROM:27142} ROM Right eval Left eval  Hip flexion    Hip extension    Hip abduction    Hip adduction    Hip internal rotation    Hip external rotation    Knee flexion    Knee extension    Ankle dorsiflexion    Ankle plantarflexion  Ankle inversion    Ankle eversion     (Blank rows = not tested)  LOWER EXTREMITY MMT:  MMT Right eval Left eval  Hip flexion    Hip extension    Hip abduction    Hip adduction    Hip internal rotation    Hip external rotation    Knee flexion    Knee extension    Ankle dorsiflexion    Ankle plantarflexion    Ankle inversion    Ankle eversion     (Blank rows = not tested)  LOWER EXTREMITY SPECIAL TESTS:   {LEspecialtests:26242}  FUNCTIONAL TESTS:  {Functional tests:24029}  GAIT: Distance walked: *** Assistive device utilized: {Assistive devices:23999} Level of assistance: {Levels of assistance:24026} Comments: ***   TODAY'S TREATMENT:                                                                                                                              DATE: ***    PATIENT EDUCATION:  Education details: *** Person educated: {Person educated:25204} Education method: {Education Method:25205} Education comprehension: {Education Comprehension:25206}  HOME EXERCISE PROGRAM: ***  ASSESSMENT:  CLINICAL IMPRESSION: Patient is a *** y.o. *** who was seen today for physical therapy evaluation and treatment for ***.   OBJECTIVE IMPAIRMENTS: {opptimpairments:25111}.   ACTIVITY LIMITATIONS: {activitylimitations:27494}  PARTICIPATION LIMITATIONS: {participationrestrictions:25113}  PERSONAL FACTORS: {Personal factors:25162} are also affecting patient's functional outcome.   REHAB POTENTIAL: {rehabpotential:25112}  CLINICAL DECISION MAKING: {clinical decision making:25114}  EVALUATION COMPLEXITY: {Evaluation complexity:25115}   GOALS: Goals reviewed with patient? {yes/no:20286}  SHORT TERM GOALS: Target date: *** *** Baseline: Goal status: {GOALSTATUS:25110}  2.  *** Baseline:  Goal status: {GOALSTATUS:25110}  3.  *** Baseline:  Goal status: {GOALSTATUS:25110}  4.  *** Baseline:  Goal status: {GOALSTATUS:25110}  5.  *** Baseline:  Goal status: {GOALSTATUS:25110}  6.  *** Baseline:  Goal status: {GOALSTATUS:25110}  LONG TERM GOALS: Target date: ***  *** Baseline:  Goal status: {GOALSTATUS:25110}  2.  *** Baseline:  Goal status: {GOALSTATUS:25110}  3.  *** Baseline:  Goal status: {GOALSTATUS:25110}  4.  *** Baseline:  Goal status: {GOALSTATUS:25110}  5.  *** Baseline:  Goal status: {GOALSTATUS:25110}  6.  *** Baseline:  Goal status:  {GOALSTATUS:25110}   PLAN:  PT FREQUENCY: {rehab frequency:25116}  PT DURATION: {rehab duration:25117}  PLANNED INTERVENTIONS: {rehab planned interventions:25118::"Therapeutic exercises","Therapeutic activity","Neuromuscular re-education","Balance training","Gait training","Patient/Family education","Self Care","Joint mobilization"}  PLAN FOR NEXT SESSION: ***   Lanice Shirts, PT 04/10/2022, 4:14 PM

## 2022-04-11 ENCOUNTER — Ambulatory Visit: Payer: Medicaid Other | Attending: Internal Medicine

## 2022-06-27 ENCOUNTER — Other Ambulatory Visit: Payer: Self-pay

## 2022-06-27 ENCOUNTER — Emergency Department (HOSPITAL_COMMUNITY)
Admission: EM | Admit: 2022-06-27 | Discharge: 2022-06-28 | Disposition: A | Discharge status: Home / Self Care | Payer: Medicaid Other | Attending: Emergency Medicine | Admitting: Emergency Medicine

## 2022-06-27 DIAGNOSIS — R202 Paresthesia of skin: Secondary | ICD-10-CM | POA: Insufficient documentation

## 2022-06-27 DIAGNOSIS — M545 Low back pain, unspecified: Secondary | ICD-10-CM | POA: Insufficient documentation

## 2022-06-27 DIAGNOSIS — E119 Type 2 diabetes mellitus without complications: Secondary | ICD-10-CM | POA: Diagnosis not present

## 2022-06-27 DIAGNOSIS — W208XXA Other cause of strike by thrown, projected or falling object, initial encounter: Secondary | ICD-10-CM | POA: Insufficient documentation

## 2022-06-27 DIAGNOSIS — G8929 Other chronic pain: Secondary | ICD-10-CM | POA: Insufficient documentation

## 2022-06-27 DIAGNOSIS — M549 Dorsalgia, unspecified: Secondary | ICD-10-CM | POA: Diagnosis present
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
## 2022-06-28 ENCOUNTER — Emergency Department (HOSPITAL_COMMUNITY): Payer: Medicaid Other

## 2022-06-28 ENCOUNTER — Encounter (HOSPITAL_COMMUNITY): Payer: Self-pay | Admitting: *Deleted

## 2022-06-28 MED ORDER — OXYCODONE-ACETAMINOPHEN 5-325 MG PO TABS
1.0000 | ORAL_TABLET | Freq: Once | ORAL | Status: AC
  Administered 2022-06-28: 1 via ORAL
  Filled 2022-06-28: qty 1

## 2022-06-28 MED ORDER — METHYLPREDNISOLONE 4 MG PO TBPK: ORAL_TABLET | ORAL | 0 refills | Status: DC

## 2022-06-28 MED ORDER — HYDROMORPHONE HCL 1 MG/ML IJ SOLN
1.0000 mg | Freq: Once | INTRAMUSCULAR | Status: DC
  Filled 2022-06-28: qty 1

## 2022-06-28 MED ORDER — KETOROLAC TROMETHAMINE 30 MG/ML IJ SOLN
30.0000 mg | Freq: Once | INTRAMUSCULAR | Status: AC
  Administered 2022-06-28: 30 mg via INTRAMUSCULAR
  Filled 2022-06-28: qty 1

## 2022-06-28 NOTE — ED Notes (Signed)
Patient transported to MRI 
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
## 2022-06-28 NOTE — ED Notes (Signed)
MRI retrieved pt.

## 2022-06-28 NOTE — ED Notes (Signed)
Pt back from MRI 

## 2022-06-28 NOTE — ED Notes (Signed)
Pt am\bulated with walker to the door jam. Pt unable to ambulate any further at that time.

## 2022-06-28 NOTE — Discharge Instructions (Signed)
You were seen today for back pain.  Your workup today does not show any new concerning findings.  You do have some spinal canal narrowing in the thoracic spine but this does not likely explain your symptoms.  Follow-up with neurosurgery and neurology.  I have placed a referral for neurology.  Start the Medrol Dosepak and take your home pain medication.

## 2022-10-02 ENCOUNTER — Ambulatory Visit: Payer: No Typology Code available for payment source | Admitting: Neurology

## 2022-10-09 ENCOUNTER — Telehealth: Payer: Self-pay | Admitting: Internal Medicine

## 2022-10-09 NOTE — Telephone Encounter (Signed)
Phone went to VM, VM box is full.  Unable to leave a message.

## 2022-10-09 NOTE — Telephone Encounter (Signed)
Pt c/o swelling: STAT is pt has developed SOB within 24 hours  How much weight have you gained and in what time span?  Patient states it   If swelling, where is the swelling located?  Legs and feet + chest and stomach   Are you currently taking a fluid pill?  No   Are you currently SOB?  No   Do you have a log of your daily weights (if so, list)?   Have you gained 3 pounds in a day or 5 pounds in a week?   Have you traveled recently?  No

## 2022-10-09 NOTE — Telephone Encounter (Signed)
Correction: patient stated that he has gained about 20 lbs but he was unable to gauge a time span.

## 2022-10-23 NOTE — Telephone Encounter (Signed)
Called and spoke w patient.  He reports that he is out of his medications.  His weight is around 270 and he states he has fluid everywhere - legs, chest, abd and that this has been ongoing.  He was seen last year for same.  His last echo was WNL.  He is overdue for yearly visit.  Scheduled him this Friday w. APP.

## 2022-10-24 NOTE — Progress Notes (Signed)
Cardiology Office Note:  .   Date:  10/26/2022  ID:  Roger Woods, DOB [birth year], MRN [id number] PCP: [facility]  Roger Woods Cardiologist:  [name], MD    History of Present Illness: .   Roger Woods is a 52 y.o. male with a past medical history of lower extremity edema, type 2 diabetes.  Patient is followed by Dr. Lynnette Caffey and presents today for an overdue annual follow-up appointment.  Per chart review, patient was first seen by Dr. Lynnette Caffey in 07/2021 for evaluation of lower extremity edema.  He underwent lower extremity Dopplers in 07/2021 that were negative for DVT.  He also underwent echocardiogram in 07/2021 that showed EF 60 to 65%, no regional wall motion abnormalities, mild LVH, normal RV function.  In 10/2021, patient was seen for preoperative evaluation.  At that appointment, he admitted that he was very inactive.  Due to his inactivity, he underwent nuclear stress test on 10/17/2021 that was normal, low risk study.  LV perfusion normal, EF 59%.  Today, patient presents with significant fluid retention, affecting his mobility and causing discomfort. He reports a history of taking diuretics, which were effective in reducing the fluid accumulation, but he has since run out. The fluid retention is most prominent in the lower extremities, extending up to the abdomen and chest area on "bad days". The patient also reports difficulty breathing, particularly when lying flat or sitting upright due to the fluid accumulation.He also reports daily chest pain, described as a stabbing sensation, which he believes is related to his fluid overload. The pain is transient and seems to be positional, worsening when leaning forward due to the pressure from the fluid. He also reports frequent headaches.  The patient has a history of spinal issues and a torn ACL, which further limit his mobility. He reports nerve-related pain in his legs, which is exacerbated by certain  movements. He also reports swelling in his toes, to the point where he can't feel them. He has an MRI spine ordered by a different specialty   The patient has a history of smoking and is not very active due to his mobility issues. He has been diagnosed with diabetes but reports not receiving any treatment for it. He has been experiencing decreased appetite, which he attributes to the fluid overload.  The patient has been seen at another facility, but he reports dissatisfaction with the care received there due to frequent changes in primary care Woods and lack of treatment for his diabetes. He is currently not on any medications.  ROS: Reports lower extremity edema, shortness of breath, decreased appetite, cough, orthopnea.   Studies Reviewed: .   Cardiac Studies & Procedures     STRESS TESTS  MYOCARDIAL PERFUSION IMAGING 10/17/2021  Narrative   The study is normal. The study is low risk.   Normal blood pressure and normal heart rate response noted during stress. Heart rate recovery was normal.   Baseline EKG demonstrates nonspecific T wave abnormality and No change in EKG with infusion from baseline.   LV perfusion is normal. There is no evidence of ischemia. There is no evidence of infarction.   Left ventricular function is normal. Nuclear stress EF: 59 %. The left ventricular ejection fraction is normal (55-65%). End diastolic cavity size is normal. End systolic cavity size is normal.   Prior study not available for comparison.   ECHOCARDIOGRAM  ECHOCARDIOGRAM COMPLETE 07/26/2021  Narrative ECHOCARDIOGRAM REPORT    Patient Name:  Roger Woods Date of Exam: 07/26/2021 Medical Rec #:  [id number]       Height:       69.0 in Accession #:    [id number]      Weight:       246.0 lb Date of Birth:  [birth year]        BSA:          2.256 m Patient Age:    51 years        BP:           136/88 mmHg Patient Gender: M               HR:           70 bpm. Exam Location:  [address]  Procedure: 2D Echo, 3D Echo, Cardiac Doppler and Color Doppler  Indications:    R60.9 Edema  History:        Patient has no prior history of Echocardiogram examinations. No cardiac history.  Sonographer:    Clearence Ped RCS Referring Phys: [id number] [name]  IMPRESSIONS   1. Left ventricular ejection fraction, by estimation, is 60 to 65%. The left ventricle has normal function. The left ventricle has no regional wall motion abnormalities. There is mild left ventricular hypertrophy. Left ventricular diastolic parameters were normal. 2. Right ventricular systolic function is normal. The right ventricular size is normal. Tricuspid regurgitation signal is inadequate for assessing PA pressure. 3. The mitral valve is normal in structure. No evidence of mitral valve regurgitation. 4. The aortic valve is tricuspid. Aortic valve regurgitation is not visualized. 5. The inferior vena cava is normal in size with greater than 50% respiratory variability, suggesting right atrial pressure of 3 mmHg.  Comparison(s): No prior Echocardiogram.  Conclusion(s)/Recommendation(s): Normal biventricular function without evidence of hemodynamically significant valvular heart disease.  FINDINGS Left Ventricle: Left ventricular ejection fraction, by estimation, is 60 to 65%. The left ventricle has normal function. The left ventricle has no regional wall motion abnormalities. The left ventricular internal cavity size was normal in size. There is mild left ventricular hypertrophy. Left ventricular diastolic parameters were normal.  Right Ventricle: The right ventricular size is normal. Right ventricular systolic function is normal. Tricuspid regurgitation signal is inadequate for assessing PA pressure.  Left Atrium: Left atrial size was normal in size.  Right Atrium: Right atrial size was normal in size.  Pericardium: There is no evidence of pericardial effusion.  Mitral Valve: The mitral  valve is normal in structure. No evidence of mitral valve regurgitation.  Tricuspid Valve: The tricuspid valve is normal in structure. Tricuspid valve regurgitation is not demonstrated.  Aortic Valve: The aortic valve is tricuspid. Aortic valve regurgitation is not visualized.  Pulmonic Valve: The pulmonic valve was normal in structure. Pulmonic valve regurgitation is not visualized.  Aorta: The aortic root and ascending aorta are structurally normal, with no evidence of dilitation.  Venous: The inferior vena cava is normal in size with greater than 50% respiratory variability, suggesting right atrial pressure of 3 mmHg.  IAS/Shunts: No atrial level shunt detected by color flow Doppler.   LEFT VENTRICLE PLAX 2D LVIDd:         4.10 cm   Diastology LVIDs:         2.90 cm   LV e' medial:    12.00 cm/s LV PW:         1.30 cm   LV E/e' medial:  7.0 LV IVS:  1.10 cm   LV e' lateral:   14.30 cm/s LVOT diam:     2.00 cm   LV E/e' lateral: 5.9 LV SV:         71 LV SV Index:   31 LVOT Area:     3.14 cm  3D Volume EF: 3D EF:        64 % LV EDV:       169 ml LV ESV:       60 ml LV SV:        109 ml  RIGHT VENTRICLE RV Basal diam:  3.20 cm RV S prime:     18.30 cm/s TAPSE (M-mode): 2.6 cm  LEFT ATRIUM             Index        RIGHT ATRIUM           Index LA diam:        3.40 cm 1.51 cm/m   RA Pressure: 3.00 mmHg LA Vol (A2C):   68.0 ml 30.14 ml/m  RA Area:     16.10 cm LA Vol (A4C):   52.3 ml 23.18 ml/m  RA Volume:   43.20 ml  19.15 ml/m LA Biplane Vol: 65.0 ml 28.81 ml/m AORTIC VALVE LVOT Vmax:   95.00 cm/s LVOT Vmean:  60.600 cm/s LVOT VTI:    0.226 m  AORTA Ao Root diam: 3.00 cm Ao Asc diam:  3.20 cm  MITRAL VALVE               TRICUSPID VALVE MV Area (PHT):             Estimated RAP:  3.00 mmHg MV Decel Time: MV E velocity: 84.40 cm/s  SHUNTS MV A velocity: 65.50 cm/s  Systemic VTI:  0.23 m MV E/A ratio:  1.29        Systemic Diam: 2.00 cm  Carolan Clines Electronically signed by Carolan Clines Signature Date/Time: 07/26/2021/3:28:12 PM    Final             Risk Assessment/Calculations:             Physical Exam:   VS:  BP 118/70   Pulse 72   Ht 5\' 9"  (1.753 m)   SpO2 99%   BMI 39.13 kg/m    Wt Readings from Last 3 Encounters:  06/27/22 264 lb 15.9 oz (120.2 kg)  11/03/21 264 lb 15.9 oz (120.2 kg)  11/01/21 265 lb (120.2 kg)    GEN: Middle aged male, sitting upright in a wheelchair. Appears uncomfortable but is in no acute distress  NECK: No JVD CARDIAC: RRR, no murmurs, rubs, gallops. Radial pulses 2+ bilaterally  RESPIRATORY:  Clear to auscultation without rales, wheezing or rhonchi. Normal work of breathing on room air  ABDOMEN: non-tender, distended EXTREMITIES:  2+ edema in BLE, to the knees. No edema in thighs. No deformity   ASSESSMENT AND PLAN: .    Lower extremity edema Hypervolemia  Shortness of breath  -Echocardiogram in 07/2021 showed EF 60 to 65%, no regional wall motion abnormalities, mild LVH - In the past, patient was on lasix which controlled his symptoms. However, he ran out and has been accumulating fluid since. Swelling usually starts in his ankles, and can spread up to his thighs, abdomen, and chest. Sometimes develops shortness of breath, orthopnea. Today, he only has swelling to his knees. Lungs are clear on exam. Reports swelling has "gone down" and today is a good day  -  Patient is fairly immobile in his day to day life. Ordered dopplers of lower extremities to rule out DVT  - Ordered repeat echocardiogram  - Ordered BNP, BMP - Start lasix 40 mg daily and potassium chloride 20 meq daily  - Discussed low sodium diets  - Instructed patient to follow up in 1-2 weeks to reassess symptoms and check labs on lasix   Chest pain  - Patient reports having chest pain when laying flat or leaning forward. Believes that when he has extra fluid on him, the fluid causes pressure/pain  - Nuclear stress test  in 10/2021 was low risk.  - EKG today nonischemic  - Ordered echocardiogram as above  - I did discuss ordering studies for evaluation of chest pain, but patient is not interested at this time. He would rather wait to see if getting the extra fluid off of him will resolve pain. I stressed the importance of following up in 1-2 weeks as scheduled to discuss symptoms. If he is still having chest pain at that time, he will need further studies   HLD  Type 2 diabetes - Previously managed by his PCP, but patient did not like the practice he was at and has not been followed. Not currently on medications  - Recently established care with [facility]. Educated patient on the importance of diabetes management     Dispo: Follow up in 1-2 weeks   Signed, Jonita Albee, PA-C

## 2022-10-26 ENCOUNTER — Ambulatory Visit: Payer: MEDICAID | Attending: Cardiology | Admitting: Cardiology

## 2022-10-26 ENCOUNTER — Other Ambulatory Visit: Payer: Self-pay | Admitting: Nurse Practitioner

## 2022-10-26 ENCOUNTER — Encounter: Payer: Self-pay | Admitting: Cardiology

## 2022-10-26 VITALS — BP 118/70 | HR 72 | Ht 69.0 in

## 2022-10-26 DIAGNOSIS — M47816 Spondylosis without myelopathy or radiculopathy, lumbar region: Secondary | ICD-10-CM

## 2022-10-26 DIAGNOSIS — R079 Chest pain, unspecified: Secondary | ICD-10-CM | POA: Diagnosis not present

## 2022-10-26 DIAGNOSIS — R0602 Shortness of breath: Secondary | ICD-10-CM

## 2022-10-26 DIAGNOSIS — E8779 Other fluid overload: Secondary | ICD-10-CM | POA: Diagnosis not present

## 2022-10-26 DIAGNOSIS — R609 Edema, unspecified: Secondary | ICD-10-CM | POA: Diagnosis not present

## 2022-10-26 MED ORDER — FUROSEMIDE 40 MG PO TABS: 40.0000 mg | ORAL_TABLET | Freq: Every day | ORAL | 3 refills | Status: DC

## 2022-10-26 MED ORDER — POTASSIUM CHLORIDE CRYS ER 20 MEQ PO TBCR: 20.0000 meq | EXTENDED_RELEASE_TABLET | Freq: Two times a day (BID) | ORAL | 3 refills | Status: DC

## 2022-10-26 NOTE — Patient Instructions (Signed)
Medication Instructions:  Increase and restart Lasix to 40 mg once a day Start Potassium Chloride 20 MEq once a day *If you need a refill on your cardiac medications before your next appointment, please call your pharmacy*   Lab Work: Today: BMP, BNP, and CBC If you have labs (blood work) drawn today and your tests are completely normal, you will receive your results only by: MyChart Message (if you have MyChart) OR A paper copy in the mail If you have any lab test that is abnormal or we need to change your treatment, we will call you to review the results.   Testing/Procedures:  Your physician has requested that you have a lower extremity venous duplex. This test is an ultrasound of the veins in the legs. It looks at venous blood flow that carries blood from the heart to the legs. Allow one hour for a Lower Venous exam. There are no restrictions or special instructions.    Follow-Up: At [facility], you and your health needs are our priority.  As part of our continuing mission to provide you with exceptional heart care, we have created designated Provider Care Teams.  These Care Teams include your primary Cardiologist (physician) and Advanced Practice Providers (APPs -  Physician Assistants and Nurse Practitioners) who all work together to provide you with the care you need, when you need it.  We recommend signing up for the patient portal called "MyChart".  Sign up information is provided on this After Visit Summary.  MyChart is used to connect with patients for Virtual Visits (Telemedicine).  Patients are able to view lab/test results, encounter notes, upcoming appointments, etc.  Non-urgent messages can be sent to your provider as well.   To learn more about what you can do with MyChart, go to [email or web address].    Your next appointment:   October 28th at 2:45 pm with Robet Leu, PA-C

## 2022-10-27 LAB — BASIC METABOLIC PANEL
BUN/Creatinine Ratio: 8 — ABNORMAL LOW (ref 9–20)
BUN: 8 mg/dL (ref 6–24)
CO2: 21 mmol/L (ref 20–29)
Calcium: 9.2 mg/dL (ref 8.7–10.2)
Chloride: 104 mmol/L (ref 96–106)
Creatinine, Ser: 1.05 mg/dL (ref 0.76–1.27)
Glucose: 149 mg/dL — ABNORMAL HIGH (ref 70–99)
Potassium: 4.6 mmol/L (ref 3.5–5.2)
Sodium: 140 mmol/L (ref 134–144)
eGFR: 85 mL/min/{1.73_m2} (ref >59)

## 2022-10-27 LAB — CBC
Hematocrit: 40.4 % (ref 37.5–51.0)
Hemoglobin: 13.7 g/dL (ref 13.0–17.7)
MCH: 30.9 pg (ref 26.6–33.0)
MCHC: 33.9 g/dL (ref 31.5–35.7)
MCV: 91 fL (ref 79–97)
Platelets: 234 10*3/uL (ref 150–450)
RBC: 4.44 x10E6/uL (ref 4.14–5.80)
RDW: 12.7 % (ref 11.6–15.4)
WBC: 6.3 10*3/uL (ref 3.4–10.8)

## 2022-10-27 LAB — BRAIN NATRIURETIC PEPTIDE: BNP: 11.1 pg/mL (ref 0.0–100.0)

## 2022-10-27 NOTE — Progress Notes (Deleted)
  Cardiology Office Note:  .   Date:  10/27/2022  ID:  Roger Woods, DOB [birth year], MRN [id number] PCP: [facility]  [facility] Providers Cardiologist:  [name], MD  History of Present Illness: .   Roger Woods is a 52 y.o. male with a past medical history of lower extremity edema, type 2 diabetes, medication noncompliance.  Patient is followed by Dr. Lynnette Caffey and presents today for a follow up appointment    Per chart review, patient was first seen by Dr. Lynnette Caffey in 07/2021 for evaluation of lower extremity edema.  He underwent lower extremity Dopplers in 07/2021 that were negative for DVT.  He also underwent echocardiogram in 07/2021 that showed EF 60 to 65%, no regional wall motion abnormalities, mild LVH, normal RV function.  In 10/2021, patient was seen for preoperative evaluation.  At that appointment, he admitted that he was very inactive.  Due to his inactivity, he underwent nuclear stress test on 10/17/2021 that was normal, low risk study.  LV perfusion normal, EF 59%.  I saw the patient in clinic on 10/26/2022.  At that time, patient reported that he had not been taking any of his medications.  He had not been following regularly with a primary care provider.  He complained of significant lower extremity edema, starting in his ankles and sometimes extending up to his thighs and abdomen.  Also had episodes of shortness of breath, orthopnea.  When I saw him in clinic, he reported it was one of his "better days", and he only had swelling up to his knees.  I started him on oral Lasix with potassium supplementation and ordered an echocardiogram.Also ordered lower extremity ultrasounds to rule out DVT   Lower extremity edema Hypervolemia  Shortness of breath  -Echocardiogram in 07/2021 showed EF 60 to 65%, no regional wall motion abnormalities, mild LVH - Presented 10/18 with complaints of lower extremity edema  - Now on PO Lasix 40 mg daily  - Echo showed *** -  DVTs ***  - Ordered BMP  - E    ROS: ***  Studies Reviewed: .        *** Risk Assessment/Calculations:   {Does this patient have ATRIAL FIBRILLATION?[phone]}         Physical Exam:   VS:  There were no vitals taken for this visit.   Wt Readings from Last 3 Encounters:  06/27/22 264 lb 15.9 oz (120.2 kg)  11/03/21 264 lb 15.9 oz (120.2 kg)  11/01/21 265 lb (120.2 kg)    GEN: Well nourished, well developed in no acute distress NECK: No JVD; No carotid bruits CARDIAC: ***RRR, no murmurs, rubs, gallops RESPIRATORY:  Clear to auscultation without rales, wheezing or rhonchi  ABDOMEN: Soft, non-tender, non-distended EXTREMITIES:  No edema; No deformity   ASSESSMENT AND PLAN: .   ***    {Are you ordering a CV Procedure (e.g. stress test, cath, DCCV, TEE, etc)?   Press F2        :[id number]}  Dispo: ***  Signed, Jonita Albee, PA-C

## 2022-10-29 ENCOUNTER — Telehealth: Payer: Self-pay

## 2022-10-29 NOTE — Telephone Encounter (Signed)
-----   Message from Jonita Albee sent at 10/29/2022  7:13 AM EDT ----- Please tell patient that his kidney function and electrolytes are normal. CBC also normal. BNP is not elevated, which suggests that his swelling is not due to heart failure.   He should decrease his potassium supplementation to 10 mEq daily (1/2 tablet daily), get echo and lower extremity ultrasound completed, and follow up as arranged for repeat labs.   Thanks [employer]

## 2022-10-29 NOTE — Telephone Encounter (Signed)
Called patient was not able to leave message.

## 2022-10-30 NOTE — Telephone Encounter (Signed)
Called patient to advise of below no way to leave message

## 2022-11-01 ENCOUNTER — Encounter (HOSPITAL_COMMUNITY): Payer: MEDICAID

## 2022-11-02 NOTE — Telephone Encounter (Signed)
Called patient no way to leave message

## 2022-11-05 ENCOUNTER — Telehealth: Payer: Self-pay

## 2022-11-05 ENCOUNTER — Ambulatory Visit: Payer: MEDICAID | Admitting: Cardiology

## 2022-11-05 NOTE — Progress Notes (Unsigned)
Cardiology Office Note:  .   Date:  11/06/2022  ID:  Roger Woods, DOB 1970-02-27, MRN [id number] PCP: [facility]  [facility] Providers Cardiologist:  [name], MD  History of Present Illness: .   Roger Woods is a 52 y.o. male with a past medical history of lower extremity edema, type 2 diabetes, medication noncompliance.  Patient is followed by Dr. Lynnette Caffey and presents today for a follow up appointment    Per chart review, patient was first seen by Dr. Lynnette Caffey in 07/2021 for evaluation of lower extremity edema.  He underwent lower extremity Dopplers in 07/2021 that were negative for DVT.  He also underwent echocardiogram in 07/2021 that showed EF 60 to 65%, no regional wall motion abnormalities, mild LVH, normal RV function.  In 10/2021, patient was seen for preoperative evaluation.  At that appointment, he admitted that he was very inactive.  Due to his inactivity, he underwent nuclear stress test on 10/17/2021 that was normal, low risk study.  LV perfusion normal, EF 59%.  I saw the patient in clinic on 10/26/2022.  At that time, patient reported that he had not been taking any of his medications.  He had not been following regularly with a primary care provider.  He complained of significant lower extremity edema, starting in his ankles and sometimes extending up to his thighs and abdomen.  Also had episodes of shortness of breath, orthopnea.  When I saw him in clinic, he reported it was one of his "better days", and he only had swelling up to his knees.  I started him on oral Lasix with potassium supplementation and ordered an echocardiogram.Also ordered lower extremity ultrasounds to rule out DVT   Today, patient presents for follow up of his lower extremity edema. They report some improvement in the swelling since starting on Lasix, but describe the process as slow. They note that the diuretic therapy causes a sensation of tightness in their muscles, particularly  after urination, which they believe is due to fluid mobilization. .The patient also reports chronic back pain, which they attribute to nerve damage. They describe episodes of uncontrollable leg movements, leading to self-inflicted toenail injuries and blisters. They are scheduled to see a spine specialist but express dissatisfaction with the current management of their back pain. They report that their pain medication was abruptly discontinued, leaving them in significant discomfort.  They report a recent change in their diet, avoiding salty, fried and greasy foods. Did eat 1  salty meal and noticed worsening swelling afterwards. They have had resolution of chest pain since starting diuresis. Denies any recent chest pain.   The patient is scheduled to start physical therapy and is hopeful that this will aid in fluid mobilization. They express a need for a wheelchair to assist with mobility during their appointments. They also mention the use of compression socks, but note the cost as a barrier. They are currently taking a large potassium supplement, which they find difficult to swallow due to its size.   ROS: Denies chest pain, shortness of breath, orthopnea. Continues to have lower extremity edema but it is improving   Studies Reviewed: .   Cardiac Studies & Procedures     STRESS TESTS  MYOCARDIAL PERFUSION IMAGING 10/17/2021  Interpretation Summary   The study is normal. The study is low risk.   Normal blood pressure and normal heart rate response noted during stress. Heart rate recovery was normal.   Baseline EKG demonstrates nonspecific T  wave abnormality and No change in EKG with infusion from baseline.   LV perfusion is normal. There is no evidence of ischemia. There is no evidence of infarction.   Left ventricular function is normal. Nuclear stress EF: 59 %. The left ventricular ejection fraction is normal (55-65%). End diastolic cavity size is normal. End systolic cavity size is  normal.   Prior study not available for comparison.   ECHOCARDIOGRAM  ECHOCARDIOGRAM COMPLETE 07/26/2021  Narrative ECHOCARDIOGRAM REPORT    Patient Name:   Roger Woods [facility] Date of Exam: 07/26/2021 Medical Rec #:  [id number]       Height:       69.0 in Accession #:    [id number]      Weight:       246.0 lb Date of Birth:  Dec 11, 1970        BSA:          2.256 m Patient Age:    51 years        BP:           136/88 mmHg Patient Gender: M               HR:           70 bpm. Exam Location:  [address]  Procedure: 2D Echo, 3D Echo, Cardiac Doppler and Color Doppler  Indications:    R60.9 Edema  History:        Patient has no prior history of Echocardiogram examinations. No cardiac history.  Sonographer:    Clearence Ped RCS Referring Phys: [id number] [name]  IMPRESSIONS   1. Left ventricular ejection fraction, by estimation, is 60 to 65%. The left ventricle has normal function. The left ventricle has no regional wall motion abnormalities. There is mild left ventricular hypertrophy. Left ventricular diastolic parameters were normal. 2. Right ventricular systolic function is normal. The right ventricular size is normal. Tricuspid regurgitation signal is inadequate for assessing PA pressure. 3. The mitral valve is normal in structure. No evidence of mitral valve regurgitation. 4. The aortic valve is tricuspid. Aortic valve regurgitation is not visualized. 5. The inferior vena cava is normal in size with greater than 50% respiratory variability, suggesting right atrial pressure of 3 mmHg.  Comparison(s): No prior Echocardiogram.  Conclusion(s)/Recommendation(s): Normal biventricular function without evidence of hemodynamically significant valvular heart disease.  FINDINGS Left Ventricle: Left ventricular ejection fraction, by estimation, is 60 to 65%. The left ventricle has normal function. The left ventricle has no regional wall motion abnormalities. The left ventricular  internal cavity size was normal in size. There is mild left ventricular hypertrophy. Left ventricular diastolic parameters were normal.  Right Ventricle: The right ventricular size is normal. Right ventricular systolic function is normal. Tricuspid regurgitation signal is inadequate for assessing PA pressure.  Left Atrium: Left atrial size was normal in size.  Right Atrium: Right atrial size was normal in size.  Pericardium: There is no evidence of pericardial effusion.  Mitral Valve: The mitral valve is normal in structure. No evidence of mitral valve regurgitation.  Tricuspid Valve: The tricuspid valve is normal in structure. Tricuspid valve regurgitation is not demonstrated.  Aortic Valve: The aortic valve is tricuspid. Aortic valve regurgitation is not visualized.  Pulmonic Valve: The pulmonic valve was normal in structure. Pulmonic valve regurgitation is not visualized.  Aorta: The aortic root and ascending aorta are structurally normal, with no evidence of dilitation.  Venous: The inferior vena cava is normal in size with greater than 50%  respiratory variability, suggesting right atrial pressure of 3 mmHg.  IAS/Shunts: No atrial level shunt detected by color flow Doppler.   LEFT VENTRICLE PLAX 2D LVIDd:         4.10 cm   Diastology LVIDs:         2.90 cm   LV e' medial:    12.00 cm/s LV PW:         1.30 cm   LV E/e' medial:  7.0 LV IVS:        1.10 cm   LV e' lateral:   14.30 cm/s LVOT diam:     2.00 cm   LV E/e' lateral: 5.9 LV SV:         71 LV SV Index:   31 LVOT Area:     3.14 cm  3D Volume EF: 3D EF:        64 % LV EDV:       169 ml LV ESV:       60 ml LV SV:        109 ml  RIGHT VENTRICLE RV Basal diam:  3.20 cm RV S prime:     18.30 cm/s TAPSE (M-mode): 2.6 cm  LEFT ATRIUM             Index        RIGHT ATRIUM           Index LA diam:        3.40 cm 1.51 cm/m   RA Pressure: 3.00 mmHg LA Vol (A2C):   68.0 ml 30.14 ml/m  RA Area:     16.10 cm LA Vol  (A4C):   52.3 ml 23.18 ml/m  RA Volume:   43.20 ml  19.15 ml/m LA Biplane Vol: 65.0 ml 28.81 ml/m AORTIC VALVE LVOT Vmax:   95.00 cm/s LVOT Vmean:  60.600 cm/s LVOT VTI:    0.226 m  AORTA Ao Root diam: 3.00 cm Ao Asc diam:  3.20 cm  MITRAL VALVE               TRICUSPID VALVE MV Area (PHT):             Estimated RAP:  3.00 mmHg MV Decel Time: MV E velocity: 84.40 cm/s  SHUNTS MV A velocity: 65.50 cm/s  Systemic VTI:  0.23 m MV E/A ratio:  1.29        Systemic Diam: 2.00 cm  Carolan Clines Electronically signed by Carolan Clines Signature Date/Time: 07/26/2021/3:28:12 PM    Final             Risk Assessment/Calculations:             Physical Exam:   VS:  BP 116/64   Pulse 84   Ht 5\' 9"  (1.753 m)   Wt 267 lb (121.1 kg)   SpO2 96%   BMI 39.43 kg/m    Wt Readings from Last 3 Encounters:  11/06/22 267 lb (121.1 kg)  06/27/22 264 lb 15.9 oz (120.2 kg)  11/03/21 264 lb 15.9 oz (120.2 kg)    GEN: Well nourished, well developed in no acute distress. Sitting upright in a wheel chair  NECK: No JVD CARDIAC: RRR, no murmurs, rubs, gallops. Radial pulses 2+ bilaterally  RESPIRATORY:  Clear to auscultation without rales, wheezing or rhonchi. Normal work of breathing on room air   ABDOMEN: Soft, non-tender, non-distended EXTREMITIES: 1+ edema in BLE; No deformity   ASSESSMENT AND PLAN: .    Lower extremity edema Hypervolemia  Shortness of breath  -Echocardiogram in 07/2021 showed EF 60 to 65%, no regional wall motion abnormalities, mild LVH - Presented 10/18 with complaints of lower extremity edema. Now on PO Lasix 40 mg daily and potassium supplementation  - Patient reports improvement in lower extremity edema. On exam, swelling is significantly improved, and he continues to have 1+ edema bilaterally. Denies shortness of breath, chest pain, orthopnea. Lungs are clear and there is no abdominal distention  - Continue lasix 40 mg daily. Decrease potassium to 10 mEq daily  -  Ordered BMP today for monitoring  - Echo pending - scheduled for 11/29/22 - Last visit, I ordered bilateral DVT ultrasounds. Our staff has called him, but he has not yet scheduled the procedure. I have a lower suspicion for DVT as swelling has improved significantly with lasix and he does not have calf pain, but would still like to rule  DVT given he recnet decline in mobility (back pain). Discussed with patient the importance of getting ultrasounds completed- now scheduled for 11/5  Chest Tightness  - Last visit, patient reported occasionally chest tightness when he had abdominal distention. Symptoms were associated with worsening hypervolemic.  - Since starting diuresis and resolution of abdominal distention, he denies chest pain/discomfort/tightness - Given resolution of chest discomfort, OK to hold off on stress test for now  - Echo pending as above     Dispo: Follow up in 4-6 weeks   Signed, Jonita Albee, PA-C

## 2022-11-05 NOTE — Telephone Encounter (Signed)
Called Patient rescheduled them for the 58 th of October.

## 2022-11-06 ENCOUNTER — Encounter: Payer: Self-pay | Admitting: Cardiology

## 2022-11-06 ENCOUNTER — Ambulatory Visit: Payer: MEDICAID | Attending: Cardiology | Admitting: Cardiology

## 2022-11-06 VITALS — BP 116/64 | HR 84 | Ht 69.0 in | Wt 267.0 lb

## 2022-11-06 DIAGNOSIS — R079 Chest pain, unspecified: Secondary | ICD-10-CM

## 2022-11-06 DIAGNOSIS — E8779 Other fluid overload: Secondary | ICD-10-CM | POA: Diagnosis not present

## 2022-11-06 DIAGNOSIS — R609 Edema, unspecified: Secondary | ICD-10-CM

## 2022-11-06 MED ORDER — POTASSIUM CHLORIDE CRYS ER 20 MEQ PO TBCR: 10.0000 meq | EXTENDED_RELEASE_TABLET | Freq: Two times a day (BID) | ORAL | 3 refills | Status: AC

## 2022-11-06 NOTE — Patient Instructions (Addendum)
Medication Instructions:  Decrease Potassium to 10 mg (half a tablet) a day *If you need a refill on your cardiac medications before your next appointment, please call your pharmacy*   Lab Work: Today: BMP If you have labs (blood work) drawn today and your tests are completely normal, you will receive your results only by: MyChart Message (if you have MyChart) OR A paper copy in the mail If you have any lab test that is abnormal or we need to change your treatment, we will call you to review the results.   Testing/Procedures:  Your physician has requested that you have a lower extremity venous duplex. This test is an ultrasound of the veins in the legs. It looks at venous blood flow that carries blood from the heart to the legs. Allow one hour for a Lower Venous exam. There are no restrictions or special instructions.    Follow-Up: At [facility], you and your health needs are our priority.  As part of our continuing mission to provide you with exceptional heart care, we have created designated Provider Care Teams.  These Care Teams include your primary Cardiologist (physician) and Advanced Practice Providers (APPs -  Physician Assistants and Nurse Practitioners) who all work together to provide you with the care you need, when you need it.  We recommend signing up for the patient portal called "MyChart".  Sign up information is provided on this After Visit Summary.  MyChart is used to connect with patients for Virtual Visits (Telemedicine).  Patients are able to view lab/test results, encounter notes, upcoming appointments, etc.  Non-urgent messages can be sent to your provider as well.   To learn more about what you can do with MyChart, go to [email or web address].    Your next appointment:   4-6 week(s)  Provider:   [name], MD

## 2022-11-07 LAB — BASIC METABOLIC PANEL
BUN/Creatinine Ratio: 9 (ref 9–20)
BUN: 9 mg/dL (ref 6–24)
CO2: 24 mmol/L (ref 20–29)
Calcium: 9.5 mg/dL (ref 8.7–10.2)
Chloride: 102 mmol/L (ref 96–106)
Creatinine, Ser: 0.96 mg/dL (ref 0.76–1.27)
Glucose: 145 mg/dL — ABNORMAL HIGH (ref 70–99)
Potassium: 4.6 mmol/L (ref 3.5–5.2)
Sodium: 138 mmol/L (ref 134–144)
eGFR: 95 mL/min/{1.73_m2} (ref >59)

## 2022-11-08 ENCOUNTER — Telehealth: Payer: Self-pay

## 2022-11-08 NOTE — Telephone Encounter (Signed)
-----   Message from Jonita Albee sent at 11/07/2022  8:27 AM EDT ----- Please tell patient that his kidney function and electrolytes are stable on current dose of lasix. No changes to medications at this time   Thanks KJ

## 2022-11-08 NOTE — Telephone Encounter (Signed)
Called patient advised of below they verbalized understanding.

## 2022-11-13 ENCOUNTER — Ambulatory Visit (HOSPITAL_COMMUNITY): Admission: RE | Admit: 2022-11-13 | Payer: No Typology Code available for payment source | Source: Ambulatory Visit

## 2022-11-18 ENCOUNTER — Inpatient Hospital Stay: Admission: RE | Admit: 2022-11-18 | Payer: MEDICAID | Source: Ambulatory Visit

## 2022-11-20 ENCOUNTER — Ambulatory Visit: Payer: MEDICAID | Admitting: Physical Therapy

## 2022-11-20 ENCOUNTER — Ambulatory Visit: Payer: MEDICAID | Attending: Nurse Practitioner

## 2022-11-20 DIAGNOSIS — R2689 Other abnormalities of gait and mobility: Secondary | ICD-10-CM | POA: Insufficient documentation

## 2022-11-20 DIAGNOSIS — M6281 Muscle weakness (generalized): Secondary | ICD-10-CM | POA: Diagnosis present

## 2022-11-20 DIAGNOSIS — M5459 Other low back pain: Secondary | ICD-10-CM | POA: Diagnosis present
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
## 2022-11-21 ENCOUNTER — Other Ambulatory Visit: Payer: Self-pay

## 2022-11-29 ENCOUNTER — Ambulatory Visit (HOSPITAL_COMMUNITY): Payer: MEDICAID | Attending: Cardiology

## 2022-11-29 DIAGNOSIS — E8779 Other fluid overload: Secondary | ICD-10-CM | POA: Insufficient documentation

## 2022-11-29 DIAGNOSIS — R079 Chest pain, unspecified: Secondary | ICD-10-CM | POA: Insufficient documentation

## 2022-11-29 DIAGNOSIS — E119 Type 2 diabetes mellitus without complications: Secondary | ICD-10-CM | POA: Diagnosis not present

## 2022-11-29 DIAGNOSIS — R609 Edema, unspecified: Secondary | ICD-10-CM | POA: Diagnosis present

## 2022-11-29 DIAGNOSIS — R0602 Shortness of breath: Secondary | ICD-10-CM | POA: Diagnosis present

## 2022-11-29 LAB — ECHOCARDIOGRAM COMPLETE
Area-P 1/2: 4.89 cm2
Calc EF: 60.6 %
S' Lateral: 3.3 cm
Single Plane A2C EF: 61.5 %
Single Plane A4C EF: 57.9 %

## 2022-11-30 ENCOUNTER — Telehealth: Payer: Self-pay

## 2022-11-30 NOTE — Telephone Encounter (Signed)
-----   Message from Jonita Albee sent at 11/30/2022  1:31 PM EST ----- Please tell patient that his echocardiogram showed EF (pumping function of the heart) was normal at 55-60%. There were no regional wall motion abnormalities. Heart muscle relaxes well. RV is functioning normally. No significant valvular abnormalities. Overall normal echo   No changes to treatment plan at this time   Thanks KJ

## 2022-11-30 NOTE — Telephone Encounter (Signed)
Called patient advised of below they verbalized understanding.

## 2022-12-12 NOTE — Progress Notes (Unsigned)
Cardiology Office Note:   Date:  12/14/2022  ID:  Roger Woods, DOB [birth year], MRN [id number] PCP:  [facility]  [facility] Providers Cardiologist:  Alverda Skeans, MD Referring MD: [facility]  Chief Complaint/Reason for Referral: Cardiology follow-up ASSESSMENT:    PATIENT DID NOT APPEAR FOR APPOINTMENT   1. Edema, unspecified type   2. Shortness of breath   3. Type 2 diabetes mellitus without complication, without long-term current use of insulin (HCC)   4. BMI 38.0-38.9,adult   5. Hyperlipidemia associated with type 2 diabetes mellitus (HCC)     PLAN:   In order of problems listed above:  PATIENT DID NOT APPEAR FOR APPOINTMENT  BMI: The patient has had multiple BNP's that have not been elevated.  He had a lower extremity ultrasound in 2023 which was negative for DVT but did not evaluate for reflux.  Will order lower extremity ultrasound to evaluate for reflux. Dyspnea: Likely due to body habitus.  Could consider right heart catheterization or cardiopulmonary exercise stress test to evaluate further.  History of smoking PFTs may be helpful. Type 2 diabetes mellitus: Start aspirin 81 mg daily, start Jardiance 10 mg daily, and continue Lipitor.  ARB?   Elevated BMI: Given history of diabetes will refer to pharmacy for recommendation regarding GLP-1 receptor agonist therapy. Hyperlipidemia: Will check lipid panel, LFTs, LP(a) today.  Goal LDL less than 70.  PATIENT DID NOT APPEAR FOR APPOINTMENT         History of Present Illness:      FOCUSED PROBLEM LIST:   Tobacco abuse Schizophrenia BMI 38 Atypical chest pain Lexiscan low risk 2023 Medical noncompliance Type 2 diabetes mellitus on metformin Hyperlipidemia  July 2023: The patient is a 52 y.o. male with the indicated medical history here for emergency room follow-up.  The patient had presented to the emergency department in the middle of June.  He had been seen by general surgery for a  pilonidal cyst abscess.  However peripheral edema was noted.  BNP was checked which was not elevated.  He was discharged on Lasix.  He presented on July 1 thing a refill of Lasix.  His BNP was again checked and was not elevated.  Troponin was also reassuring.  Chest x-ray showed no acute cardiopulmonary process.   The patient tells me that he has been relatively sedentary for a long time.  He has gained about 20 pounds in the last year.  Over the last couple months he has noticed increasing bilateral peripheral edema.  He denies any paroxysmal nocturnal dyspnea, orthopnea, chest pain, presyncope, or syncope.  He says Lasix has helped his edema but also notes that drinking a Pepsi seems to help but as well.  Plan: Trial of Lasix and compression therapy; lower extremity ultrasound for DVT.  December 2024: In the interim the patient was seen for preoperative cardiovascular evaluation last year.  He was referred for stress test which demonstrated low risk findings.  Echocardiogram also demonstrated preserved LV function with no significant valvular abnormalities.  He was seen in October with lower extremity edema.  Empiric Lasix was ordered.  A BNP was drawn and found to be not elevated.         Current Medications: No outpatient medications have been marked as taking for the 12/14/22 encounter (Office Visit) with Roger Pyo, MD.     Review of Systems:   Please see the history of present illness.    All other systems reviewed and are  negative.     EKGs/Labs/Other Test Reviewed:   EKG:    EKG Interpretation Date/Time:    Ventricular Rate:    PR Interval:    QRS Duration:    QT Interval:    QTC Calculation:   R Axis:      Text Interpretation:           Signed, Roger Pyo, MD  12/14/2022 3:23 PM    [facility] [address] [place], [place], [place]  [address] Phone: [phone]; Fax: [phone]   Note:  This document was prepared using Dragon voice  recognition software and may include unintentional dictation errors.

## 2022-12-13 ENCOUNTER — Ambulatory Visit
Admission: RE | Admit: 2022-12-13 | Discharge: 2022-12-13 | Disposition: A | Discharge status: Home / Self Care | Payer: MEDICAID | Source: Ambulatory Visit | Attending: Nurse Practitioner | Admitting: Nurse Practitioner

## 2022-12-13 DIAGNOSIS — M47816 Spondylosis without myelopathy or radiculopathy, lumbar region: Secondary | ICD-10-CM

## 2022-12-14 ENCOUNTER — Ambulatory Visit: Payer: MEDICAID | Attending: Internal Medicine | Admitting: Internal Medicine

## 2022-12-14 DIAGNOSIS — Z6838 Body mass index (BMI) 38.0-38.9, adult: Secondary | ICD-10-CM

## 2022-12-14 DIAGNOSIS — R609 Edema, unspecified: Secondary | ICD-10-CM

## 2022-12-14 DIAGNOSIS — R0609 Other forms of dyspnea: Secondary | ICD-10-CM

## 2022-12-14 DIAGNOSIS — E119 Type 2 diabetes mellitus without complications: Secondary | ICD-10-CM

## 2022-12-14 DIAGNOSIS — E785 Hyperlipidemia, unspecified: Secondary | ICD-10-CM

## 2022-12-17 NOTE — Progress Notes (Signed)
This encounter was created in error - please disregard.

## 2023-01-13 ENCOUNTER — Inpatient Hospital Stay (HOSPITAL_COMMUNITY)
Admission: EM | Admit: 2023-01-13 | Discharge: 2023-01-17 | DRG: 392 | Disposition: A | Discharge status: Home / Self Care | Payer: MEDICAID | Attending: Internal Medicine | Admitting: Internal Medicine

## 2023-01-13 DIAGNOSIS — E1169 Type 2 diabetes mellitus with other specified complication: Secondary | ICD-10-CM

## 2023-01-13 DIAGNOSIS — E1165 Type 2 diabetes mellitus with hyperglycemia: Secondary | ICD-10-CM

## 2023-01-13 DIAGNOSIS — K5900 Constipation, unspecified: Secondary | ICD-10-CM | POA: Diagnosis present

## 2023-01-13 DIAGNOSIS — F209 Schizophrenia, unspecified: Secondary | ICD-10-CM | POA: Diagnosis present

## 2023-01-13 DIAGNOSIS — Z885 Allergy status to narcotic agent status: Secondary | ICD-10-CM

## 2023-01-13 DIAGNOSIS — K5903 Drug induced constipation: Principal | ICD-10-CM | POA: Diagnosis present

## 2023-01-13 DIAGNOSIS — R1084 Generalized abdominal pain: Secondary | ICD-10-CM

## 2023-01-13 DIAGNOSIS — E785 Hyperlipidemia, unspecified: Secondary | ICD-10-CM

## 2023-01-13 DIAGNOSIS — F319 Bipolar disorder, unspecified: Secondary | ICD-10-CM | POA: Diagnosis present

## 2023-01-13 DIAGNOSIS — K562 Volvulus: Principal | ICD-10-CM

## 2023-01-13 DIAGNOSIS — E66812 Obesity, class 2: Secondary | ICD-10-CM | POA: Diagnosis present

## 2023-01-13 DIAGNOSIS — Z79899 Other long term (current) drug therapy: Secondary | ICD-10-CM

## 2023-01-13 DIAGNOSIS — Z6839 Body mass index (BMI) 39.0-39.9, adult: Secondary | ICD-10-CM

## 2023-01-13 DIAGNOSIS — E119 Type 2 diabetes mellitus without complications: Secondary | ICD-10-CM

## 2023-01-13 DIAGNOSIS — R739 Hyperglycemia, unspecified: Secondary | ICD-10-CM

## 2023-01-13 DIAGNOSIS — E782 Mixed hyperlipidemia: Secondary | ICD-10-CM | POA: Diagnosis present

## 2023-01-13 DIAGNOSIS — G8929 Other chronic pain: Secondary | ICD-10-CM | POA: Diagnosis present

## 2023-01-13 DIAGNOSIS — Z794 Long term (current) use of insulin: Secondary | ICD-10-CM

## 2023-01-13 DIAGNOSIS — Z7984 Long term (current) use of oral hypoglycemic drugs: Secondary | ICD-10-CM

## 2023-01-13 DIAGNOSIS — F39 Unspecified mood [affective] disorder: Secondary | ICD-10-CM

## 2023-01-13 DIAGNOSIS — Z880 Allergy status to penicillin: Secondary | ICD-10-CM

## 2023-01-13 DIAGNOSIS — K5641 Fecal impaction: Secondary | ICD-10-CM | POA: Diagnosis present

## 2023-01-13 DIAGNOSIS — T40605A Adverse effect of unspecified narcotics, initial encounter: Secondary | ICD-10-CM | POA: Diagnosis present

## 2023-01-13 DIAGNOSIS — Z981 Arthrodesis status: Secondary | ICD-10-CM

## 2023-01-13 DIAGNOSIS — F17210 Nicotine dependence, cigarettes, uncomplicated: Secondary | ICD-10-CM | POA: Diagnosis present

## 2023-01-13 DIAGNOSIS — R600 Localized edema: Secondary | ICD-10-CM

## 2023-01-13 NOTE — ED Triage Notes (Signed)
 Patient arrived with complaints of constipation for unknown amount of time, states he has used enemas and laxatives with no relief.

## 2023-01-14 ENCOUNTER — Encounter (HOSPITAL_COMMUNITY)
Admission: EM | Disposition: A | Discharge status: Home / Self Care | Payer: Self-pay | Source: Home / Self Care | Attending: Internal Medicine

## 2023-01-14 ENCOUNTER — Inpatient Hospital Stay (HOSPITAL_COMMUNITY): Payer: MEDICAID | Admitting: Certified Registered"

## 2023-01-14 ENCOUNTER — Emergency Department (HOSPITAL_COMMUNITY): Payer: MEDICAID

## 2023-01-14 ENCOUNTER — Encounter (HOSPITAL_COMMUNITY): Payer: Self-pay | Admitting: Emergency Medicine

## 2023-01-14 ENCOUNTER — Other Ambulatory Visit: Payer: Self-pay

## 2023-01-14 DIAGNOSIS — E785 Hyperlipidemia, unspecified: Secondary | ICD-10-CM | POA: Diagnosis not present

## 2023-01-14 DIAGNOSIS — Z885 Allergy status to narcotic agent status: Secondary | ICD-10-CM | POA: Diagnosis not present

## 2023-01-14 DIAGNOSIS — E1169 Type 2 diabetes mellitus with other specified complication: Secondary | ICD-10-CM | POA: Diagnosis present

## 2023-01-14 DIAGNOSIS — E119 Type 2 diabetes mellitus without complications: Secondary | ICD-10-CM | POA: Diagnosis not present

## 2023-01-14 DIAGNOSIS — F319 Bipolar disorder, unspecified: Secondary | ICD-10-CM | POA: Diagnosis present

## 2023-01-14 DIAGNOSIS — Z880 Allergy status to penicillin: Secondary | ICD-10-CM | POA: Diagnosis not present

## 2023-01-14 DIAGNOSIS — R739 Hyperglycemia, unspecified: Secondary | ICD-10-CM | POA: Diagnosis present

## 2023-01-14 DIAGNOSIS — T40605A Adverse effect of unspecified narcotics, initial encounter: Secondary | ICD-10-CM | POA: Diagnosis present

## 2023-01-14 DIAGNOSIS — K5900 Constipation, unspecified: Secondary | ICD-10-CM | POA: Diagnosis present

## 2023-01-14 DIAGNOSIS — K5641 Fecal impaction: Secondary | ICD-10-CM | POA: Diagnosis present

## 2023-01-14 DIAGNOSIS — Z6839 Body mass index (BMI) 39.0-39.9, adult: Secondary | ICD-10-CM | POA: Diagnosis not present

## 2023-01-14 DIAGNOSIS — Z981 Arthrodesis status: Secondary | ICD-10-CM | POA: Diagnosis not present

## 2023-01-14 DIAGNOSIS — R1084 Generalized abdominal pain: Secondary | ICD-10-CM | POA: Diagnosis not present

## 2023-01-14 DIAGNOSIS — E1165 Type 2 diabetes mellitus with hyperglycemia: Secondary | ICD-10-CM | POA: Diagnosis present

## 2023-01-14 DIAGNOSIS — F209 Schizophrenia, unspecified: Secondary | ICD-10-CM | POA: Diagnosis present

## 2023-01-14 DIAGNOSIS — K562 Volvulus: Secondary | ICD-10-CM

## 2023-01-14 DIAGNOSIS — Z794 Long term (current) use of insulin: Secondary | ICD-10-CM | POA: Diagnosis not present

## 2023-01-14 DIAGNOSIS — F39 Unspecified mood [affective] disorder: Secondary | ICD-10-CM

## 2023-01-14 DIAGNOSIS — Z7984 Long term (current) use of oral hypoglycemic drugs: Secondary | ICD-10-CM | POA: Diagnosis not present

## 2023-01-14 DIAGNOSIS — E782 Mixed hyperlipidemia: Secondary | ICD-10-CM | POA: Diagnosis present

## 2023-01-14 DIAGNOSIS — R600 Localized edema: Secondary | ICD-10-CM | POA: Diagnosis not present

## 2023-01-14 DIAGNOSIS — Z79899 Other long term (current) drug therapy: Secondary | ICD-10-CM | POA: Diagnosis not present

## 2023-01-14 DIAGNOSIS — K5909 Other constipation: Secondary | ICD-10-CM | POA: Diagnosis not present

## 2023-01-14 DIAGNOSIS — G8929 Other chronic pain: Secondary | ICD-10-CM | POA: Diagnosis present

## 2023-01-14 DIAGNOSIS — K5903 Drug induced constipation: Secondary | ICD-10-CM | POA: Diagnosis present

## 2023-01-14 DIAGNOSIS — F17210 Nicotine dependence, cigarettes, uncomplicated: Secondary | ICD-10-CM | POA: Diagnosis present

## 2023-01-14 DIAGNOSIS — E66812 Obesity, class 2: Secondary | ICD-10-CM | POA: Diagnosis present

## 2023-01-14 HISTORY — PX: FLEXIBLE SIGMOIDOSCOPY: SHX5431

## 2023-01-14 LAB — URINALYSIS, ROUTINE W REFLEX MICROSCOPIC
Bacteria, UA: NONE SEEN
Bilirubin Urine: NEGATIVE
Glucose, UA: >=500 mg/dL — AB
Hgb urine dipstick: NEGATIVE
Ketones, ur: 5 mg/dL — AB
Leukocytes,Ua: NEGATIVE
Nitrite: NEGATIVE
Protein, ur: NEGATIVE mg/dL
Specific Gravity, Urine: 1.031 — ABNORMAL HIGH (ref 1.005–1.030)
pH: 6 (ref 5.0–8.0)

## 2023-01-14 LAB — COMPREHENSIVE METABOLIC PANEL
ALT: 34 U/L (ref 0–44)
AST: 25 U/L (ref 15–41)
Albumin: 4.2 g/dL (ref 3.5–5.0)
Alkaline Phosphatase: 87 U/L (ref 38–126)
Anion gap: 10 (ref 5–15)
BUN: 14 mg/dL (ref 6–20)
CO2: 23 mmol/L (ref 22–32)
Calcium: 8.9 mg/dL (ref 8.9–10.3)
Chloride: 93 mmol/L — ABNORMAL LOW (ref 98–111)
Creatinine, Ser: 0.99 mg/dL (ref 0.61–1.24)
GFR, Estimated: >60 mL/min (ref >60)
Glucose, Bld: 582 mg/dL (ref 70–99)
Potassium: 4.3 mmol/L (ref 3.5–5.1)
Sodium: 126 mmol/L — ABNORMAL LOW (ref 135–145)
Total Bilirubin: 0.6 mg/dL (ref 0.0–1.2)
Total Protein: 8 g/dL (ref 6.5–8.1)

## 2023-01-14 LAB — CBC
HCT: 38.5 % — ABNORMAL LOW (ref 39.0–52.0)
HCT: 35.6 % — ABNORMAL LOW (ref 39.0–52.0)
Hemoglobin: 13.6 g/dL (ref 13.0–17.0)
Hemoglobin: 12.6 g/dL — ABNORMAL LOW (ref 13.0–17.0)
MCH: 30.9 pg (ref 26.0–34.0)
MCH: 31 pg (ref 26.0–34.0)
MCHC: 35.3 g/dL (ref 30.0–36.0)
MCHC: 35.4 g/dL (ref 30.0–36.0)
MCV: 87.5 fL (ref 80.0–100.0)
MCV: 87.5 fL (ref 80.0–100.0)
Platelets: 224 10*3/uL (ref 150–400)
Platelets: 222 10*3/uL (ref 150–400)
RBC: 4.4 MIL/uL (ref 4.22–5.81)
RBC: 4.07 MIL/uL — ABNORMAL LOW (ref 4.22–5.81)
RDW: 12.4 % (ref 11.5–15.5)
RDW: 12.4 % (ref 11.5–15.5)
WBC: 7.8 10*3/uL (ref 4.0–10.5)
WBC: 6.4 10*3/uL (ref 4.0–10.5)
nRBC: 0 % (ref 0.0–0.2)
nRBC: 0 % (ref 0.0–0.2)

## 2023-01-14 LAB — CBG MONITORING, ED
Glucose-Capillary: 193 mg/dL — ABNORMAL HIGH (ref 70–99)
Glucose-Capillary: 260 mg/dL — ABNORMAL HIGH (ref 70–99)
Glucose-Capillary: 262 mg/dL — ABNORMAL HIGH (ref 70–99)
Glucose-Capillary: 350 mg/dL — ABNORMAL HIGH (ref 70–99)
Glucose-Capillary: 443 mg/dL — ABNORMAL HIGH (ref 70–99)

## 2023-01-14 LAB — OSMOLALITY: Osmolality: 297 mosm/kg — ABNORMAL HIGH (ref 275–295)

## 2023-01-14 LAB — LIPASE, BLOOD: Lipase: 31 U/L (ref 11–51)

## 2023-01-14 LAB — POCT I-STAT, CHEM 8
BUN: 12 mg/dL (ref 6–20)
Calcium, Ion: 1.24 mmol/L (ref 1.15–1.40)
Chloride: 101 mmol/L (ref 98–111)
Creatinine, Ser: 1 mg/dL (ref 0.61–1.24)
Glucose, Bld: 100 mg/dL — ABNORMAL HIGH (ref 70–99)
HCT: 38 % — ABNORMAL LOW (ref 39.0–52.0)
Hemoglobin: 12.9 g/dL — ABNORMAL LOW (ref 13.0–17.0)
Potassium: 3.3 mmol/L — ABNORMAL LOW (ref 3.5–5.1)
Sodium: 140 mmol/L (ref 135–145)
TCO2: 28 mmol/L (ref 22–32)

## 2023-01-14 LAB — LACTIC ACID, PLASMA: Lactic Acid, Venous: 0.9 mmol/L (ref 0.5–1.9)

## 2023-01-14 LAB — MAGNESIUM: Magnesium: 2.3 mg/dL (ref 1.7–2.4)

## 2023-01-14 SURGERY — SIGMOIDOSCOPY, FLEXIBLE
Anesthesia: Monitor Anesthesia Care | Laterality: Left

## 2023-01-14 MED ORDER — SENNOSIDES-DOCUSATE SODIUM 8.6-50 MG PO TABS
1.0000 | ORAL_TABLET | Freq: Two times a day (BID) | ORAL | Status: DC
  Administered 2023-01-14 – 2023-01-17 (×7): 1 via ORAL
  Filled 2023-01-14 (×7): qty 1

## 2023-01-14 MED ORDER — SMOG ENEMA
960.0000 mL | Freq: Once | RECTAL | Status: AC
  Administered 2023-01-14: 960 mL via RECTAL
  Filled 2023-01-14: qty 960

## 2023-01-14 MED ORDER — SODIUM CHLORIDE 0.9 % IV SOLN: INTRAVENOUS | Status: DC

## 2023-01-14 MED ORDER — POLYETHYLENE GLYCOL 3350 17 G PO PACK
17.0000 g | PACK | Freq: Two times a day (BID) | ORAL | Status: DC
  Administered 2023-01-14 – 2023-01-15 (×3): 17 g via ORAL
  Filled 2023-01-14 (×3): qty 1

## 2023-01-14 MED ORDER — SODIUM CHLORIDE 0.9 % IV SOLN: INTRAVENOUS | Status: AC

## 2023-01-14 MED ORDER — BISACODYL 10 MG RE SUPP
10.0000 mg | Freq: Once | RECTAL | Status: AC
  Administered 2023-01-14: 10 mg via RECTAL
  Filled 2023-01-14: qty 1

## 2023-01-14 MED ORDER — KETOROLAC TROMETHAMINE 15 MG/ML IJ SOLN
15.0000 mg | Freq: Once | INTRAMUSCULAR | Status: AC
  Administered 2023-01-14: 15 mg via INTRAVENOUS
  Filled 2023-01-14: qty 1

## 2023-01-14 MED ORDER — IOHEXOL 300 MG/ML  SOLN
100.0000 mL | Freq: Once | INTRAMUSCULAR | Status: AC | PRN
  Administered 2023-01-14: 100 mL via INTRAVENOUS

## 2023-01-14 MED ORDER — PHENYLEPHRINE 80 MCG/ML (10ML) SYRINGE FOR IV PUSH (FOR BLOOD PRESSURE SUPPORT)
PREFILLED_SYRINGE | INTRAVENOUS | Status: DC | PRN
  Administered 2023-01-14 (×4): 160 ug via INTRAVENOUS

## 2023-01-14 MED ORDER — SMOG ENEMA: 400.0000 mL | Freq: Once | RECTAL | Status: DC

## 2023-01-14 MED ORDER — EPHEDRINE 5 MG/ML INJ
INTRAVENOUS | Status: DC | PRN
  Administered 2023-01-14 (×2): 5 mg via INTRAVENOUS

## 2023-01-14 MED ORDER — PROPOFOL 10 MG/ML IV BOLUS
INTRAVENOUS | Status: DC | PRN
  Administered 2023-01-14 (×2): 50 mg via INTRAVENOUS
  Administered 2023-01-14: 30 mg via INTRAVENOUS

## 2023-01-14 MED ORDER — INSULIN ASPART 100 UNIT/ML IJ SOLN: 0.0000 [IU] | Freq: Three times a day (TID) | INTRAMUSCULAR | Status: DC

## 2023-01-14 MED ORDER — ACETAMINOPHEN 650 MG RE SUPP: 650.0000 mg | Freq: Four times a day (QID) | RECTAL | Status: DC | PRN

## 2023-01-14 MED ORDER — FENTANYL CITRATE PF 50 MCG/ML IJ SOSY
100.0000 ug | PREFILLED_SYRINGE | Freq: Once | INTRAMUSCULAR | Status: AC
  Administered 2023-01-14: 100 ug via INTRAVENOUS
  Filled 2023-01-14: qty 2

## 2023-01-14 MED ORDER — ACETAMINOPHEN 325 MG PO TABS
650.0000 mg | ORAL_TABLET | Freq: Four times a day (QID) | ORAL | Status: DC | PRN
  Administered 2023-01-14: 650 mg via ORAL
  Filled 2023-01-14: qty 2

## 2023-01-14 MED ORDER — LIDOCAINE 2% (20 MG/ML) 5 ML SYRINGE
INTRAMUSCULAR | Status: DC | PRN
  Administered 2023-01-14: 100 mg via INTRAVENOUS

## 2023-01-14 MED ORDER — FENTANYL CITRATE PF 50 MCG/ML IJ SOSY
25.0000 ug | PREFILLED_SYRINGE | INTRAMUSCULAR | Status: DC | PRN
  Administered 2023-01-14 (×2): 25 ug via INTRAVENOUS
  Filled 2023-01-14 (×2): qty 1

## 2023-01-14 MED ORDER — INSULIN ASPART 100 UNIT/ML IJ SOLN
0.0000 [IU] | INTRAMUSCULAR | Status: DC
  Administered 2023-01-14: 7 [IU] via SUBCUTANEOUS
  Administered 2023-01-14 (×2): 5 [IU] via SUBCUTANEOUS
  Administered 2023-01-14: 2 [IU] via SUBCUTANEOUS
  Administered 2023-01-15: 7 [IU] via SUBCUTANEOUS
  Administered 2023-01-15: 5 [IU] via SUBCUTANEOUS
  Administered 2023-01-15: 2 [IU] via SUBCUTANEOUS
  Administered 2023-01-16: 5 [IU] via SUBCUTANEOUS
  Administered 2023-01-16: 9 [IU] via SUBCUTANEOUS
  Administered 2023-01-16: 2 [IU] via SUBCUTANEOUS
  Administered 2023-01-16: 1 [IU] via SUBCUTANEOUS
  Filled 2023-01-14: qty 0.09

## 2023-01-14 MED ORDER — INSULIN ASPART 100 UNIT/ML IJ SOLN: 0.0000 [IU] | Freq: Every day | INTRAMUSCULAR | Status: DC

## 2023-01-14 MED ORDER — NALOXONE HCL 0.4 MG/ML IJ SOLN: 0.4000 mg | INTRAMUSCULAR | Status: DC | PRN

## 2023-01-14 MED ORDER — LACTATED RINGERS IV BOLUS
1000.0000 mL | Freq: Once | INTRAVENOUS | Status: AC
  Administered 2023-01-14: 1000 mL via INTRAVENOUS

## 2023-01-14 MED ORDER — INSULIN ASPART 100 UNIT/ML IJ SOLN
10.0000 [IU] | Freq: Once | INTRAMUSCULAR | Status: AC
  Administered 2023-01-14: 10 [IU] via SUBCUTANEOUS
  Filled 2023-01-14: qty 0.1

## 2023-01-14 MED ORDER — OXYCODONE HCL 5 MG PO TABS
5.0000 mg | ORAL_TABLET | Freq: Four times a day (QID) | ORAL | Status: DC | PRN
  Administered 2023-01-14 – 2023-01-17 (×6): 5 mg via ORAL
  Filled 2023-01-14 (×6): qty 1

## 2023-01-14 MED ORDER — PROPOFOL 500 MG/50ML IV EMUL
INTRAVENOUS | Status: DC | PRN
  Administered 2023-01-14: 150 ug/kg/min via INTRAVENOUS

## 2023-01-14 NOTE — ED Notes (Signed)
 Patient will go to Endo at a later time

## 2023-01-14 NOTE — ED Notes (Signed)
 Currently attempting [place] PIV access.

## 2023-01-14 NOTE — ED Notes (Signed)
 Gave pt beef broth

## 2023-01-14 NOTE — Transfer of Care (Signed)
 Immediate Anesthesia Transfer of Care Note  Patient: Roger Woods  Procedure(s) Performed: FLEXIBLE SIGMOIDOSCOPY (Left)  Patient Location: PACU and Endoscopy Unit  Anesthesia Type:MAC  Level of Consciousness: drowsy and responds to stimulation  Airway & Oxygen Therapy: Patient Spontanous Breathing and Patient connected to face mask oxygen  Post-op Assessment: Report given to RN and Post -op Vital signs reviewed and stable  Post vital signs: Reviewed and stable, fluid open  Last Vitals:  Vitals Value Taken Time  BP 89/32   Temp    Pulse 50 01/14/23 1252  Resp 13 01/14/23 1252  SpO2 100 % 01/14/23 1252  Vitals shown include unfiled device data.  Last Pain:  Vitals:   01/14/23 1159  TempSrc: Temporal  PainSc: 10-Worst pain ever         Complications: No notable events documented.

## 2023-01-14 NOTE — Consult Note (Signed)
 Consult Note  Roger Woods 1970/04/21  [id number].    Requesting MD: Bernardino Fireman, MD Chief Complaint/Reason for Consult: possible sigmoid volvulus   HPI:  Patient is a 53 year old male who presented to the ED with constipation and abdominal discomfort. Patient is a poor historian but does report several months of constipation and abdominal pain/bloating. He has tried taking OTC laxatives and enemas but stated he has not had a normal BM in months. He reports some abdominal discomfort currently but not severe. He is diabetic and not currently on medications for this. He reports he was seeing a PCP at [facility] but has switched to another PCP. He has never had a colonoscopy. Does report having to strain with BMs and hx of hemorrhoidal bleeding. He is not currently on any blood thinners. He has a hx of chronic pain but reportedly is not taking opioids any longer. Allergy to PCNs. No prior abdominal surgery. He denies current alcohol or drug use, smokes tobacco occasionally. Denies nausea or vomiting, fever or chills.   ROS: Negative other than HPI  Family History  Problem Relation Age of Onset   High blood pressure Maternal Grandmother    Cancer Other     Past Medical History:  Diagnosis Date   ACL tear    left leg   Bipolar disorder (HCC)    Chronic pain    Complication of anesthesia    Prolonged sedation   Diabetes mellitus without complication (HCC)    Edema    Headache(784.0)    Hemorrhoids    Insomnia due to medical condition    Psychosis (HCC)    Schizophrenia (HCC)    Weakness    occasionally in both arms and hands;related to neck issues    Past Surgical History:  Procedure Laterality Date   HARDWARE REMOVAL N/A 06/24/2013   Procedure: EXPLORATION/HARDWARE REMOVAL CERVICAL FOUR-FIVE, CERVICAL FIVE-SIX, AND CERVICAL SIX-SEVEN.;  Surgeon: Arley SHAUNNA Helling, MD;  Location: [facility];  Service: Neurosurgery;  Laterality: N/A;  posteriorpt. states hardware  was not removed   PILONIDAL CYST EXCISION N/A 11/03/2021   Procedure: EXCISION OF PILONIDAL CYST;  Surgeon: Lyndel Deward PARAS, MD;  Location: [facility];  Service: General;  Laterality: N/A;   POSTERIOR CERVICAL FUSION/FORAMINOTOMY N/A 11/19/2012   Procedure: CERVICAL TWO TO CERVICAL SEVEN POSTERIOR CERVICAL FUSION/FORAMINOTOMY LEVEL 5;  Surgeon: Arley SHAUNNA Helling, MD;  Location: [facility];  Service: Neurosurgery;  Laterality: N/A;  C2-7 posteior cervical arthrodesis with instrumentation   SHOULDER ARTHROSCOPY WITH ROTATOR CUFF REPAIR AND SUBACROMIAL DECOMPRESSION Left 04/20/2020   Procedure: SHOULDER ARTHROSCOPY WITH MINI OPEN  ROTATOR CUFF REPAIR AND SUBACROMIAL DECOMPRESSION;  Surgeon: Duwayne Purchase, MD;  Location: [facility];  Service: Orthopedics;  Laterality: Left;  90 MINS    Social History:  reports that he has been smoking cigarettes. He has a 7 pack-year smoking history. He has never used smokeless tobacco. He reports that he does not currently use alcohol. He reports that he does not use drugs.  Allergies:  Allergies  Allergen Reactions   Hydrocodone  Itching and Other (See Comments)    Throat itching    Penicillins Other (See Comments)    Unknown reaction Has patient had a PCN reaction causing immediate rash, facial/tongue/throat swelling, SOB or lightheadedness with hypotension: Yes Has patient had a PCN reaction causing severe rash involving mucus membranes or skin necrosis: Yes Has patient had a PCN reaction that required hospitalization No Has patient had a  PCN reaction occurring within the last 10 years: No If all of the above answers are NO, then may proceed with Cephalosporin use.     (Not in a hospital admission)   Blood pressure (!) 115/49, pulse 68, temperature 98.4 F (36.9 C), temperature source Oral, resp. rate 13, height 5' 9 (1.753 m), weight 121.1 kg, SpO2 94%. Physical Exam:  General: pleasant, WD, overweight male who is laying in bed in NAD HEENT: head is  normocephalic, atraumatic.  Sclera are noninjected.  EOMI.  Ears and nose without any masses or lesions.  Mouth is pink and moist Heart: regular, rate, and rhythm.  Lungs: CTAB, no wheezes, rhonchi, or rales noted.  Respiratory effort nonlabored Abd: soft, NT, mild distention, +BS, no masses, hernias, or organomegaly MS: all 4 extremities are symmetrical with no cyanosis, clubbing, or edema. Skin: warm and dry with no masses, lesions, or rashes    Results for orders placed or performed during the hospital encounter of 01/13/23 (from the past 48 hours)  Urinalysis, Routine w reflex microscopic -Urine, Clean Catch     Status: Abnormal   Collection Time: 01/13/23 11:22 PM  Result Value Ref Range   Color, Urine COLORLESS (A) YELLOW   APPearance CLEAR CLEAR   Specific Gravity, Urine 1.031 (H) 1.005 - 1.030   pH 6.0 5.0 - 8.0   Glucose, UA >=500 (A) NEGATIVE mg/dL   Hgb urine dipstick NEGATIVE NEGATIVE   Bilirubin Urine NEGATIVE NEGATIVE   Ketones, ur 5 (A) NEGATIVE mg/dL   Protein, ur NEGATIVE NEGATIVE mg/dL   Nitrite NEGATIVE NEGATIVE   Leukocytes,Ua NEGATIVE NEGATIVE   RBC / HPF 0-5 0 - 5 RBC/hpf   WBC, UA 0-5 0 - 5 WBC/hpf   Bacteria, UA NONE SEEN NONE SEEN   Squamous Epithelial / HPF 0-5 0 - 5 /HPF    Comment: Performed at [facility], [address], [place], [place] [address]  Lipase, blood     Status: None   Collection Time: 01/14/23 12:16 AM  Result Value Ref Range   Lipase 31 11 - 51 U/L    Comment: Performed at [facility], [address] [place], [place] [address]  Comprehensive metabolic panel     Status: Abnormal   Collection Time: 01/14/23 12:16 AM  Result Value Ref Range   Sodium 126 (L) 135 - 145 mmol/L   Potassium 4.3 3.5 - 5.1 mmol/L   Chloride 93 (L) 98 - 111 mmol/L   CO2 23 22 - 32 mmol/L   Glucose, Bld 582 (HH) 70 - 99 mg/dL    Comment: CRITICAL RESULT CALLED TO, READ BACK BY AND VERIFIED WITH BRATU D. @ 0211  01/14/2023  MCLEAN K. Glucose reference range applies only to samples taken after fasting for at least 8 hours.    BUN 14 6 - 20 mg/dL   Creatinine, Ser 9.00 0.61 - 1.24 mg/dL   Calcium  8.9 8.9 - 10.3 mg/dL   Total Protein 8.0 6.5 - 8.1 g/dL   Albumin 4.2 3.5 - 5.0 g/dL   AST 25 15 - 41 U/L   ALT 34 0 - 44 U/L   Alkaline Phosphatase 87 38 - 126 U/L   Total Bilirubin 0.6 0.0 - 1.2 mg/dL   GFR, Estimated >39 >39 mL/min    Comment: (NOTE) Calculated using the CKD-EPI Creatinine Equation (2021)    Anion gap 10 5 - 15    Comment: Performed at [facility], [address]  [place], [place] [address]  CBC     Status: Abnormal   Collection Time: 01/14/23 12:16 AM  Result Value Ref Range   WBC 7.8 4.0 - 10.5 K/uL   RBC 4.40 4.22 - 5.81 MIL/uL   Hemoglobin 13.6 13.0 - 17.0 g/dL   HCT 61.4 (L) 60.9 - 47.9 %   MCV 87.5 80.0 - 100.0 fL   MCH 30.9 26.0 - 34.0 pg   MCHC 35.3 30.0 - 36.0 g/dL   RDW 87.5 88.4 - 84.4 %   Platelets 224 150 - 400 K/uL   nRBC 0.0 0.0 - 0.2 %    Comment: Performed at [facility], [address], [place], [place] [address]  Magnesium     Status: None   Collection Time: 01/14/23 12:16 AM  Result Value Ref Range   Magnesium 2.3 1.7 - 2.4 mg/dL    Comment: Performed at [facility], [address], [place], [place] [address]  CBG monitoring, ED     Status: Abnormal   Collection Time: 01/14/23  4:05 AM  Result Value Ref Range   Glucose-Capillary 443 (H) 70 - 99 mg/dL    Comment: Glucose reference range applies only to samples taken after fasting for at least 8 hours.  Lactic acid, plasma     Status: None   Collection Time: 01/14/23  6:38 AM  Result Value Ref Range   Lactic Acid, Venous 0.9 0.5 - 1.9 mmol/L    Comment: Performed at [facility], [address] [place], [place] [address]  CBC     Status: Abnormal   Collection Time: 01/14/23  6:38 AM  Result Value Ref Range   WBC 6.4  4.0 - 10.5 K/uL   RBC 4.07 (L) 4.22 - 5.81 MIL/uL   Hemoglobin 12.6 (L) 13.0 - 17.0 g/dL   HCT 64.3 (L) 60.9 - 47.9 %   MCV 87.5 80.0 - 100.0 fL   MCH 31.0 26.0 - 34.0 pg   MCHC 35.4 30.0 - 36.0 g/dL   RDW 87.5 88.4 - 84.4 %   Platelets 222 150 - 400 K/uL   nRBC 0.0 0.0 - 0.2 %    Comment: Performed at [facility], [address] [place], [place] [address]  CBG monitoring, ED     Status: Abnormal   Collection Time: 01/14/23  7:43 AM  Result Value Ref Range   Glucose-Capillary 260 (H) 70 - 99 mg/dL    Comment: Glucose reference range applies only to samples taken after fasting for at least 8 hours.   CT ABDOMEN PELVIS W CONTRAST Result Date: 01/14/2023 CLINICAL DATA:  Constipation. Abdominal pain. Enemas and laxatives not providing relief EXAM: CT ABDOMEN AND PELVIS WITH CONTRAST TECHNIQUE: Multidetector CT imaging of the abdomen and pelvis was performed using the standard protocol following bolus administration of intravenous contrast. RADIATION DOSE REDUCTION: This exam was performed according to the departmental dose-optimization program which includes automated exposure control, adjustment of the mA and/or kV according to patient size and/or use of iterative reconstruction technique. CONTRAST:  OMNIPAQUE  IOHEXOL  300 MG/ML  SOLN COMPARISON:  02/26/2021 FINDINGS: Lower chest: No acute abnormality. Hepatobiliary: Hepatic steatosis. Normal gallbladder. No biliary dilation. Pancreas: Unremarkable. Spleen: Unremarkable. Adrenals/Urinary Tract: Normal adrenal glands. No urinary calculi or hydronephrosis. Bladder is unremarkable. Stomach/Bowel: Redundant sigmoid colon which twists around the sigmoid mesocolon (circa series 9/image 83-137). The course of the sigmoid colon is altered compared with 02/26/2021. The twisted segment sigmoid colon is decompressed. There  is moderate colonic stool upstream from the twisted segment. No dilation. No bowel wall thickening. Stomach and  appendix are within normal limits. Vascular/Lymphatic: No significant vascular findings are present. No enlarged abdominal or pelvic lymph nodes. Reproductive: Unremarkable. Other: No free intraperitoneal fluid or air. Musculoskeletal: No acute fracture. IMPRESSION: 1. Redundant sigmoid colon which twists around the sigmoid mesocolon. The twisted segment is narrowed with moderate colonic stool upstream. No dilation or wall thickening. Findings are suspicious but not definitive for sigmoid volvulus. Recommend surgical consult and low threshold for repeat imaging if there is clinical deterioration or increasing abdominal pain. 2. Hepatic steatosis. These results were called by telephone at the time of interpretation on 01/14/2023 at 3:14 am to provider [facility] , who verbally acknowledged these results. Electronically Signed   By: Norman Gatlin M.D.   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 No dilation of colon or wall thickening, suspicious but not definitive for sigmoid volvulus  - on exam pt with mild distention but no peritonitis or significant TTP, HD stable  - no leukocytosis or elevated lactate - in general treatment for sigmoid volvulus is usually GI decompression prior to consideration of surgical intervention if hemodynamically stable. GI has been consulted as well. Await results of flexible sigmoidoscopy, but no acute surgical intervention planned at present.   FEN: NPO, IVF @100cc /h per [facility] VTE: SCDs ID: no current abx  - per [facility] -  T2DM with hyperglycemia  HLD Bipolar disorder  Chronic pain  LE edema  I reviewed ED provider notes, hospitalist notes, last 24 h vitals and pain scores, last 48 h intake and output, last 24 h labs and trends, and last 24 h imaging results.   Burnard JONELLE Louder, [facility]  Surgery 01/14/2023, 9:19 AM Please see Amion for pager number during day hours 7:00am-4:30pm

## 2023-01-14 NOTE — Op Note (Signed)
 [facility] Patient Name: Roger Woods Procedure Date: 01/14/2023 MRN: [id number] Attending MD: Elsie Cree , MD, [id number] Date of Birth: 07-06-1970 CSN: [id number] Age: 53 Admit Type: Inpatient Procedure:                Flexible Sigmoidoscopy Indications:              Abnormal CT of the GI tract Providers:                Elsie Cree, MD, Particia Fischer, RN, Hoy Penner, RN, Fairy Marina, Technician Referring MD:             [facility] Medicines:                Monitored Anesthesia Care Complications:            No immediate complications. Estimated Blood Loss:     Estimated blood loss: none. Procedure:                Pre-Anesthesia Assessment:                           - Prior to the procedure, a History and Physical                            was performed, and patient medications and                            allergies were reviewed. The patient's tolerance of                            previous anesthesia was also reviewed. The risks                            and benefits of the procedure and the sedation                            options and risks were discussed with the patient.                            All questions were answered, and informed consent                            was obtained. Prior Anticoagulants: The patient has                            taken no anticoagulant or antiplatelet agents. ASA                            Grade Assessment: III - A patient with severe                            systemic disease. After reviewing the risks and  benefits, the patient was deemed in satisfactory                            condition to undergo the procedure.                           After obtaining informed consent, the scope was                            passed under direct vision. The [id number]-HQ190L                            ([id number]) Olympus colonoscope was introduced                             through the anus and advanced to the the splenic                            flexure. The flexible sigmoidoscopy was                            accomplished without difficulty. The patient                            tolerated the procedure well. The quality of the                            bowel preparation was poor. Scope In: 12:35:37 PM Scope Out: 12:43:49 PM Total Procedure Duration: 0 hours 8 minutes 12 seconds  Findings:      The perianal and digital rectal examinations were normal.      A large amount of stool was found in the rectum, in the recto-sigmoid       colon, in the sigmoid colon and in the descending colon, interfering       with visualization. No obvious sigmoid volvulus.      Copious stool in rectum; unable to perform retroflexion. Impression:               - Preparation of the colon was poor.                           - Stool in the rectum, in the recto-sigmoid colon,                            in the sigmoid colon and in the descending colon.                           - No obvious volvulus seen, but evaluation of                            mucosa limited by presence of copious stool. Moderate Sedation:      Not Applicable - Patient had care per Anesthesia. Recommendation:           - Return patient to hospital ward for ongoing care.                           -  Clear liquid diet today.                           - Continue present medications.                           - Enemas today, maybe peroral bowel prep tomorrow                            and possible colonoscopy this admission.                           - Case discussed with Dr. Rosalie.                           [facility] will follow. Procedure Code(s):        --- Professional ---                           [phone], Sigmoidoscopy, flexible; diagnostic,                            including collection of specimen(s) by brushing or                            washing, when performed (separate  procedure) Diagnosis Code(s):        --- Professional ---                           R93.3, Abnormal findings on diagnostic imaging of                            other parts of digestive tract CPT copyright 2022 [employer]. All rights reserved. The codes documented in this report are preliminary and upon coder review may  be revised to meet current compliance requirements. Elsie Cree, MD 01/14/2023 12:58:56 PM This report has been signed electronically. Number of Addenda: 0

## 2023-01-14 NOTE — ED Notes (Signed)
 Unsuccesful [place] PIV in both arms, will have another [place] RN attempt.

## 2023-01-14 NOTE — H&P (Signed)
 History and Physical    Roger Woods FMW:[id number] DOB: 03-05-1970 DOA: 01/13/2023  PCP: [facility]  Patient coming from: Home  Chief Complaint: Constipation  HPI: Roger Woods is a 53 y.o. male with medical history significant of type 2 diabetes, hyperlipidemia, schizophrenia, bipolar disorder presented to the ED complaining of constipation and abdominal distention/pain.  Vital signs stable.  Labs showing no leukocytosis, sodium 126, chloride 93, glucose 582, bicarb 23, anion gap 10, normal lipase and LFTs, UA with >500 glucose, 5 ketones, and no signs of infection.  CT abdomen pelvis showing findings suspicious for sigmoid volvulus. Patient was given fentanyl , Toradol , and smog enema in the ED.  CBG 443 after 2 L IV fluids and subsequently given NovoLog  10 units. EDP discussed with Dr. Sheldon with general surgery who recommended consulting GI.  General surgery will follow in consultation.  Case was subsequently discussed with Dr. Rosalie with [facility] who will see the patient in consultation and recommended holding off placing NG tube at this time.  [facility] called to admit.  Patient is a poor historian.  He reports having constipation and generalized abdominal pain/distention for several months.  He has tried over-the-counter laxatives and enemas.  He is not sure when he last had a bowel movement, he thinks maybe several months ago.  Denies fevers, chills, nausea, or vomiting.  No other complaints.  Not able to give any additional history.  Review of Systems:  Review of Systems  All other systems reviewed and are negative.   Past Medical History:  Diagnosis Date   ACL tear    left leg   Bipolar disorder (HCC)    Chronic pain    Complication of anesthesia    Prolonged sedation   Diabetes mellitus without complication (HCC)    Edema    Headache(784.0)    Hemorrhoids    Insomnia due to medical condition    Psychosis (HCC)    Schizophrenia (HCC)    Weakness    occasionally in  both arms and hands;related to neck issues    Past Surgical History:  Procedure Laterality Date   HARDWARE REMOVAL N/A 06/24/2013   Procedure: EXPLORATION/HARDWARE REMOVAL CERVICAL FOUR-FIVE, CERVICAL FIVE-SIX, AND CERVICAL SIX-SEVEN.;  Surgeon: Arley SHAUNNA Helling, MD;  Location: [facility];  Service: Neurosurgery;  Laterality: N/A;  posteriorpt. states hardware was not removed   PILONIDAL CYST EXCISION N/A 11/03/2021   Procedure: EXCISION OF PILONIDAL CYST;  Surgeon: Lyndel Deward PARAS, MD;  Location: [facility];  Service: General;  Laterality: N/A;   POSTERIOR CERVICAL FUSION/FORAMINOTOMY N/A 11/19/2012   Procedure: CERVICAL TWO TO CERVICAL SEVEN POSTERIOR CERVICAL FUSION/FORAMINOTOMY LEVEL 5;  Surgeon: Arley SHAUNNA Helling, MD;  Location: [facility];  Service: Neurosurgery;  Laterality: N/A;  C2-7 posteior cervical arthrodesis with instrumentation   SHOULDER ARTHROSCOPY WITH ROTATOR CUFF REPAIR AND SUBACROMIAL DECOMPRESSION Left 04/20/2020   Procedure: SHOULDER ARTHROSCOPY WITH MINI OPEN  ROTATOR CUFF REPAIR AND SUBACROMIAL DECOMPRESSION;  Surgeon: Duwayne Purchase, MD;  Location: [facility];  Service: Orthopedics;  Laterality: Left;  90 MINS     reports that he has been smoking cigarettes. He has a 7 pack-year smoking history. He has never used smokeless tobacco. He reports that he does not currently use alcohol. He reports that he does not use drugs.  Allergies  Allergen Reactions   Hydrocodone  Itching and Other (See Comments)    Throat itching    Penicillins Other (See Comments)    Unknown reaction Has patient had a  PCN reaction causing immediate rash, facial/tongue/throat swelling, SOB or lightheadedness with hypotension: Yes Has patient had a PCN reaction causing severe rash involving mucus membranes or skin necrosis: Yes Has patient had a PCN reaction that required hospitalization No Has patient had a PCN reaction occurring within the last 10 years: No If all of the above answers are NO, then may  proceed with Cephalosporin use.     Family History  Problem Relation Age of Onset   High blood pressure Maternal Grandmother    Cancer Other     Prior to Admission medications   Medication Sig Start Date End Date Taking? Authorizing Provider  acetaminophen  (TYLENOL ) 500 MG tablet Take 500-1,000 mg by mouth every 6 (six) hours as needed (pain.).   Yes [provider]  baclofen (LIORESAL) 10 MG tablet Take 10 mg by mouth daily as needed for muscle spasms.   Yes [provider]  docusate sodium  (COLACE) 100 MG capsule Take 100 mg by mouth daily as needed (constipation.).   Yes [provider]  furosemide  (LASIX ) 40 MG tablet Take 1 tablet (40 mg total) by mouth daily. 10/26/22 01/24/23 Yes Vicci Rollo SAUNDERS, PA-C  naloxone  (NARCAN ) nasal spray 4 mg/0.1 mL Place 0.4 mg into the nose as needed (opioid overdose).   Yes [provider]  Oxycodone  HCl 10 MG TABS Take 10 mg by mouth 5 (five) times daily. 09/19/21  Yes [provider]  potassium chloride  SA (KLOR-CON  M) 20 MEQ tablet Take 0.5 tablets (10 mEq total) by mouth 2 (two) times daily. 11/06/22  Yes Vicci Rollo SAUNDERS, PA-C  rosuvastatin  (CRESTOR ) 5 MG tablet Take 5 mg by mouth daily.   Yes [provider]  methylPREDNISolone  (MEDROL  DOSEPAK) 4 MG TBPK tablet Take as directed on packet. Patient not taking: Reported on 01/14/2023 06/28/22   Horton, Charmaine FALCON, MD  oxyCODONE -acetaminophen  (PERCOCET/ROXICET) 5-325 MG tablet Take 1 tablet by mouth every 6 (six) hours as needed for up to 5 days Patient not taking: Reported on 01/14/2023 12/20/21       Physical Exam: Vitals:   01/14/23 0227 01/14/23 0400 01/14/23 0422 01/14/23 0500  BP: 134/84 127/72 117/61 (!) 111/54  Pulse: 69 74 64 68  Resp: 18 18 16 11   Temp: 98.6 F (37 C)     TempSrc: Oral     SpO2: 97% 96% 98% 94%  Weight:      Height:        Physical Exam Vitals reviewed.  Constitutional:      General: He is not in acute  distress.    Comments: Resting comfortably, watching television  HENT:     Head: Normocephalic and atraumatic.  Eyes:     Extraocular Movements: Extraocular movements intact.  Cardiovascular:     Rate and Rhythm: Normal rate and regular rhythm.     Pulses: Normal pulses.  Pulmonary:     Effort: Pulmonary effort is normal. No respiratory distress.     Breath sounds: Normal breath sounds. No wheezing or rales.  Abdominal:     General: There is distension.     Palpations: Abdomen is soft.     Comments: Bowel sounds present Mild generalized tenderness to palpation No rebound, guarding, or rigidity.  Musculoskeletal:     Cervical back: Normal range of motion.     Right lower leg: Edema present.     Left lower leg: Edema present.     Comments: Bilateral pedal edema (chronic per patient)  Skin:    General:  Skin is warm and dry.  Neurological:     General: No focal deficit present.     Mental Status: He is alert and oriented to person, place, and time.     Labs on Admission: I have personally reviewed following labs and imaging studies  CBC: Recent Labs  Lab 01/14/23 0016  WBC 7.8  HGB 13.6  HCT 38.5*  MCV 87.5  PLT 224   Basic Metabolic Panel: Recent Labs  Lab 01/14/23 0016  NA 126*  K 4.3  CL 93*  CO2 23  GLUCOSE 582*  BUN 14  CREATININE 0.99  CALCIUM  8.9  MG 2.3   GFR: Estimated Creatinine Clearance: 112.2 mL/min (by C-G formula based on SCr of 0.99 mg/dL). Liver Function Tests: Recent Labs  Lab 01/14/23 0016  AST 25  ALT 34  ALKPHOS 87  BILITOT 0.6  PROT 8.0  ALBUMIN 4.2   Recent Labs  Lab 01/14/23 0016  LIPASE 31   No results for input(s): AMMONIA in the last 168 hours. Coagulation Profile: No results for input(s): INR, PROTIME in the last 168 hours. Cardiac Enzymes: No results for input(s): CKTOTAL, CKMB, CKMBINDEX, TROPONINI in the last 168 hours. BNP (last 3 results) No results for input(s): PROBNP in the last 8760  hours. HbA1C: No results for input(s): HGBA1C in the last 72 hours. CBG: Recent Labs  Lab 01/14/23 0405  GLUCAP 443*   Lipid Profile: No results for input(s): CHOL, HDL, LDLCALC, TRIG, CHOLHDL, LDLDIRECT in the last 72 hours. Thyroid Function Tests: No results for input(s): TSH, T4TOTAL, FREET4, T3FREE, THYROIDAB in the last 72 hours. Anemia Panel: No results for input(s): VITAMINB12, FOLATE, FERRITIN, TIBC, IRON, RETICCTPCT in the last 72 hours. Urine analysis:    Component Value Date/Time   COLORURINE COLORLESS (A) 01/13/2023 2322   APPEARANCEUR CLEAR 01/13/2023 2322   LABSPEC 1.031 (H) 01/13/2023 2322   PHURINE 6.0 01/13/2023 2322   GLUCOSEU >=500 (A) 01/13/2023 2322   HGBUR NEGATIVE 01/13/2023 2322   BILIRUBINUR NEGATIVE 01/13/2023 2322   KETONESUR 5 (A) 01/13/2023 2322   PROTEINUR NEGATIVE 01/13/2023 2322   UROBILINOGEN 2.0 (H) 06/19/2021 1648   NITRITE NEGATIVE 01/13/2023 2322   LEUKOCYTESUR NEGATIVE 01/13/2023 2322    Radiological Exams on Admission: CT ABDOMEN PELVIS W CONTRAST Result Date: 01/14/2023 CLINICAL DATA:  Constipation. Abdominal pain. Enemas and laxatives not providing relief EXAM: CT ABDOMEN AND PELVIS WITH CONTRAST TECHNIQUE: Multidetector CT imaging of the abdomen and pelvis was performed using the standard protocol following bolus administration of intravenous contrast. RADIATION DOSE REDUCTION: This exam was performed according to the departmental dose-optimization program which includes automated exposure control, adjustment of the mA and/or kV according to patient size and/or use of iterative reconstruction technique. CONTRAST:  OMNIPAQUE  IOHEXOL  300 MG/ML  SOLN COMPARISON:  02/26/2021 FINDINGS: Lower chest: No acute abnormality. Hepatobiliary: Hepatic steatosis. Normal gallbladder. No biliary dilation. Pancreas: Unremarkable. Spleen: Unremarkable. Adrenals/Urinary Tract: Normal adrenal glands. No urinary calculi or  hydronephrosis. Bladder is unremarkable. Stomach/Bowel: Redundant sigmoid colon which twists around the sigmoid mesocolon (circa series 9/image 83-137). The course of the sigmoid colon is altered compared with 02/26/2021. The twisted segment sigmoid colon is decompressed. There is moderate colonic stool upstream from the twisted segment. No dilation. No bowel wall thickening. Stomach and appendix are within normal limits. Vascular/Lymphatic: No significant vascular findings are present. No enlarged abdominal or pelvic lymph nodes. Reproductive: Unremarkable. Other: No free intraperitoneal fluid or air. Musculoskeletal: No acute fracture. IMPRESSION: 1. Redundant sigmoid colon which twists  around the sigmoid mesocolon. The twisted segment is narrowed with moderate colonic stool upstream. No dilation or wall thickening. Findings are suspicious but not definitive for sigmoid volvulus. Recommend surgical consult and low threshold for repeat imaging if there is clinical deterioration or increasing abdominal pain. 2. Hepatic steatosis. These results were called by telephone at the time of interpretation on 01/14/2023 at 3:14 am to provider [facility] , who verbally acknowledged these results. Electronically Signed   By: Norman Gatlin M.D.   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  He is reporting constipation and generalized abdominal pain/distention for several months.  He was trying over-the-counter laxatives and enemas at home, unclear when he last had a bowel movement.  CT showing redundant sigmoid colon which twists around the sigmoid mesocolon. The twisted segment is narrowed with moderate colonic stool upstream. No dilation or wall thickening. Findings are suspicious but not definitive for sigmoid volvulus.  Patient is afebrile and no leukocytosis on labs. No signs of peritonitis on exam.  He will likely need endoscopic detorsion.  Both general  surgery and [facility] consulted by ED physician.  GI recommended holding off placing NG tube at this time.  Keep n.p.o., IV fluid hydration, and continue pain management.  Trend WBC count and check lactate.  Hyperglycemia Type 2 diabetes Patient is not on medications for his diabetes and last A1c was 6.1 in October 2023.  Glucose 582 on initial labs without signs of DKA.  He was given 2 L IV fluids in the ED after which CBG improved to 443.  He was subsequently given NovoLog  10 units.  Will place on sensitive sliding scale insulin  every 4 hours for now as he is n.p.o.  Check serum osmolarity.  Repeat A1c ordered.  Hyperlipidemia Hold statin at this time as patient is n.p.o.  Schizophrenia, bipolar disorder Does not appear to be on meds.  DVT prophylaxis: SCDs Code Status: Full Code (discussed with the patient) Family Communication: Girlfriend at bedside. Level of care: Progressive Care Unit Admission status: It is my clinical opinion that admission to INPATIENT is reasonable and necessary because of the expectation that this patient will require hospital care that crosses at least 2 midnights to treat this condition based on the medical complexity of the problems presented.  Given the aforementioned information, the predictability of an adverse outcome is felt to be significant.  Editha Ram MD [facility]  If 7PM-7AM, please contact night-coverage [email or web address]  01/14/2023, 5:32 AM

## 2023-01-14 NOTE — Progress Notes (Addendum)
 Patient underwent flexible sigmoidoscopy that did not show concern for sigmoid volvulus. General surgery team discussed with GI. No indication for surgical intervention or involvement at this time. Recommend bowel regimen and outpatient GI follow up for eventual colonoscopy. General surgery will sign off.  Burnard JONELLE Louder, [facility] 01/14/2023, 1:45 PM Please see Amion for pager number during day hours 7:00am-4:30pm

## 2023-01-14 NOTE — ED Notes (Signed)
Patient transported to Endo

## 2023-01-14 NOTE — ED Notes (Signed)
 Enema performed with little/small bowel movement following.

## 2023-01-14 NOTE — Anesthesia Procedure Notes (Signed)
 Procedure Name: MAC Date/Time: 01/14/2023 12:30 PM  Performed by: Metta Andrea NOVAK, CRNAPre-anesthesia Checklist: Patient identified, Emergency Drugs available, Suction available, Patient being monitored and Timeout performed Oxygen Delivery Method: Simple face mask

## 2023-01-14 NOTE — Anesthesia Preprocedure Evaluation (Addendum)
 Anesthesia Evaluation  Patient identified by MRN, date of birth, ID band Patient awake    Reviewed: Allergy & Precautions, NPO status , Patient's Chart, lab work & pertinent test results  Airway Mallampati: II  TM Distance: >3 FB Neck ROM: Full    Dental no notable dental hx.    Pulmonary Current Smoker and Patient abstained from smoking.   Pulmonary exam normal        Cardiovascular negative cardio ROS Normal cardiovascular exam     Neuro/Psych  Headaches PSYCHIATRIC DISORDERS   Bipolar Disorder Schizophrenia     GI/Hepatic negative GI ROS,,,(+)     substance abuse    Endo/Other  diabetes  Class 3 obesityhyponatremia  Renal/GU negative Renal ROS     Musculoskeletal  (+) Arthritis ,  narcotic dependentChronic pain   Abdominal  (+) + obese  Peds  Hematology  (+) Blood dyscrasia, anemia   Anesthesia Other Findings sigmoid volvulus  Reproductive/Obstetrics                             Anesthesia Physical Anesthesia Plan  ASA: 3  Anesthesia Plan: MAC   Post-op Pain Management:    Induction:   PONV Risk Score and Plan: 0 and Propofol  infusion and Treatment may vary due to age or medical condition  Airway Management Planned: Simple Face Mask  Additional Equipment:   Intra-op Plan:   Post-operative Plan:   Informed Consent: I have reviewed the patients History and Physical, chart, labs and discussed the procedure including the risks, benefits and alternatives for the proposed anesthesia with the patient or authorized representative who has indicated his/her understanding and acceptance.     Dental advisory given  Plan Discussed with: CRNA  Anesthesia Plan Comments:        Anesthesia Quick Evaluation

## 2023-01-14 NOTE — ED Provider Notes (Signed)
 [place] EMERGENCY DEPARTMENT AT [facility] Provider Note   CSN: [id number] Arrival date & time: 01/13/23  2307     History  Chief Complaint  Patient presents with   Constipation    Roger Woods is a 53 y.o. male.   Constipation Associated symptoms: abdominal pain   Patient presents for concern of constipation.  Medical history includes bipolar disorder, DM, hemorrhoids.  He has been prescribed oxycodone  but no longer takes this.  He has not had a normal bowel movement in the past several months.  He does feel that his abdomen is distended.  At home, he has tried magnesium citrate, over-the-counter laxative pills, as well as over-the-counter enema.  This morning, he did an enema and got a very small amount of stool out.  He continues to have abdominal distention and discomfort.     Home Medications Prior to Admission medications   Medication Sig Start Date End Date Taking? Authorizing Provider  acetaminophen  (TYLENOL ) 500 MG tablet Take 500-1,000 mg by mouth every 6 (six) hours as needed (pain.).   Yes [provider]  baclofen (LIORESAL) 10 MG tablet Take 10 mg by mouth daily as needed for muscle spasms.   Yes [provider]  docusate sodium  (COLACE) 100 MG capsule Take 100 mg by mouth daily as needed (constipation.).   Yes [provider]  furosemide  (LASIX ) 40 MG tablet Take 1 tablet (40 mg total) by mouth daily. 10/26/22 01/24/23 Yes Vicci Rollo SAUNDERS, PA-C  naloxone  (NARCAN ) nasal spray 4 mg/0.1 mL Place 0.4 mg into the nose as needed (opioid overdose).   Yes [provider]  Oxycodone  HCl 10 MG TABS Take 10 mg by mouth 5 (five) times daily. 09/19/21  Yes [provider]  potassium chloride  SA (KLOR-CON  M) 20 MEQ tablet Take 0.5 tablets (10 mEq total) by mouth 2 (two) times daily. 11/06/22  Yes Vicci Rollo SAUNDERS, PA-C  rosuvastatin  (CRESTOR ) 5 MG tablet Take 5 mg by mouth daily.   Yes [provider]   methylPREDNISolone  (MEDROL  DOSEPAK) 4 MG TBPK tablet Take as directed on packet. Patient not taking: Reported on 01/14/2023 06/28/22   Horton, Charmaine FALCON, MD  oxyCODONE -acetaminophen  (PERCOCET/ROXICET) 5-325 MG tablet Take 1 tablet by mouth every 6 (six) hours as needed for up to 5 days Patient not taking: Reported on 01/14/2023 12/20/21         Allergies    Hydrocodone  and Penicillins    Review of Systems   Review of Systems  Gastrointestinal:  Positive for abdominal distention, abdominal pain and constipation.  All other systems reviewed and are negative.   Physical Exam Updated Vital Signs BP 127/72   Pulse 74   Temp 98.6 F (37 C) (Oral)   Resp 18   Ht 5' 9 (1.753 m)   Wt 121.1 kg   SpO2 96%   BMI 39.43 kg/m  Physical Exam Vitals and nursing note reviewed.  Constitutional:      General: He is not in acute distress.    Appearance: Normal appearance. He is well-developed. He is not ill-appearing, toxic-appearing or diaphoretic.  HENT:     Head: Normocephalic and atraumatic.     Right Ear: External ear normal.     Left Ear: External ear normal.     Nose: Nose normal.     Mouth/Throat:     Mouth: Mucous membranes are moist.  Eyes:     Extraocular Movements: Extraocular movements intact.     Conjunctiva/sclera: Conjunctivae normal.  Cardiovascular:     Rate and Rhythm: Normal rate and regular rhythm.  Pulmonary:     Effort: Pulmonary effort is normal. No respiratory distress.  Abdominal:     General: There is distension.     Palpations: Abdomen is soft.     Tenderness: There is abdominal tenderness. There is no guarding or rebound.  Musculoskeletal:        General: No swelling. Normal range of motion.     Cervical back: Normal range of motion and neck supple.  Skin:    General: Skin is warm and dry.     Coloration: Skin is not jaundiced or pale.  Neurological:     General: No focal deficit present.     Mental Status: He is alert and oriented to person, place,  and time.  Psychiatric:        Mood and Affect: Mood normal.        Behavior: Behavior normal.     ED Results / Procedures / Treatments   Labs (all labs ordered are listed, but only abnormal results are displayed) Labs Reviewed  COMPREHENSIVE METABOLIC PANEL - Abnormal; Notable for the following components:      Result Value   Sodium 126 (*)    Chloride 93 (*)    Glucose, Bld 582 (*)    All other components within normal limits  CBC - Abnormal; Notable for the following components:   HCT 38.5 (*)    All other components within normal limits  URINALYSIS, ROUTINE W REFLEX MICROSCOPIC - Abnormal; Notable for the following components:   Color, Urine COLORLESS (*)    Specific Gravity, Urine 1.031 (*)    Glucose, UA >=500 (*)    Ketones, ur 5 (*)    All other components within normal limits  CBG MONITORING, ED - Abnormal; Notable for the following components:   Glucose-Capillary 443 (*)    All other components within normal limits  LIPASE, BLOOD  MAGNESIUM    EKG None  Radiology CT ABDOMEN PELVIS W CONTRAST Result Date: 01/14/2023 CLINICAL DATA:  Constipation. Abdominal pain. Enemas and laxatives not providing relief EXAM: CT ABDOMEN AND PELVIS WITH CONTRAST TECHNIQUE: Multidetector CT imaging of the abdomen and pelvis was performed using the standard protocol following bolus administration of intravenous contrast. RADIATION DOSE REDUCTION: This exam was performed according to the departmental dose-optimization program which includes automated exposure control, adjustment of the mA and/or kV according to patient size and/or use of iterative reconstruction technique. CONTRAST:  OMNIPAQUE  IOHEXOL  300 MG/ML  SOLN COMPARISON:  02/26/2021 FINDINGS: Lower chest: No acute abnormality. Hepatobiliary: Hepatic steatosis. Normal gallbladder. No biliary dilation. Pancreas: Unremarkable. Spleen: Unremarkable. Adrenals/Urinary Tract: Normal adrenal glands. No urinary calculi or hydronephrosis.  Bladder is unremarkable. Stomach/Bowel: Redundant sigmoid colon which twists around the sigmoid mesocolon (circa series 9/image 83-137). The course of the sigmoid colon is altered compared with 02/26/2021. The twisted segment sigmoid colon is decompressed. There is moderate colonic stool upstream from the twisted segment. No dilation. No bowel wall thickening. Stomach and appendix are within normal limits. Vascular/Lymphatic: No significant vascular findings are present. No enlarged abdominal or pelvic lymph nodes. Reproductive: Unremarkable. Other: No free intraperitoneal fluid or air. Musculoskeletal: No acute fracture. IMPRESSION: 1. Redundant sigmoid colon which twists around the sigmoid mesocolon. The twisted segment is narrowed with moderate colonic stool upstream. No dilation or wall thickening. Findings are suspicious but not definitive for sigmoid volvulus. Recommend surgical consult and low threshold for repeat imaging if there is clinical  deterioration or increasing abdominal pain. 2. Hepatic steatosis. These results were called by telephone at the time of interpretation on 01/14/2023 at 3:14 am to provider [facility] , who verbally acknowledged these results. Electronically Signed   By: Norman Gatlin M.D.   On: 01/14/2023 03:17    Procedures Procedures    Medications Ordered in ED Medications  fentaNYL  (SUBLIMAZE ) injection 100 mcg (has no administration in time range)  lactated ringers  bolus 1,000 mL (0 mLs Intravenous Stopped 01/14/23 0229)  sorbitol , magnesium hydroxide, mineral oil, glycerin (SMOG) enema (960 mLs Rectal Given 01/14/23 0318)  ketorolac  (TORADOL ) 15 MG/ML injection 15 mg (15 mg Intravenous Given 01/14/23 0133)  lactated ringers  bolus 1,000 mL (1,000 mLs Intravenous New Bag/Given 01/14/23 0229)  iohexol  (OMNIPAQUE ) 300 MG/ML solution 100 mL (100 mLs Intravenous Contrast Given 01/14/23 0241)  insulin  aspart (novoLOG ) injection 10 Units (10 Units Subcutaneous Given 01/14/23 0409)     ED Course/ Medical Decision Making/ A&P                                 Medical Decision Making Amount and/or Complexity of Data Reviewed Labs: ordered. Radiology: ordered.  Risk Prescription drug management. Decision regarding hospitalization.   This patient presents to the ED for concern of abdominal distention and discomfort, this involves an extensive number of treatment options, and is a complaint that carries with it a high risk of complications and morbidity.  The differential diagnosis includes constipation, bowel obstruction, colitis, enteritis   Co morbidities that complicate the patient evaluation  bipolar disorder, DM, hemorrhoids   Additional history obtained:  Additional history obtained from N/A External records from outside source obtained and reviewed including EMR   Lab Tests:  I Ordered, and personally interpreted labs.  The pertinent results include: Hyperglycemia with pseudohyponatremia and no evidence of DKA.  No current leukocytosis.   Imaging Studies ordered:  I ordered imaging studies including CT of abdomen pelvis I independently visualized and interpreted imaging which showed concern for sigmoid volvulus I agree with the radiologist interpretation   Cardiac Monitoring: / EKG:  The patient was maintained on a cardiac monitor.  I personally viewed and interpreted the cardiac monitored which showed an underlying rhythm of: Sinus rhythm   Consultations Obtained:  I requested consultation with the general surgeon, Dr. Sheldon,  and discussed lab and imaging findings as well as pertinent plan - they recommend: Gastroenterology management.  Surgery will follow in consult. I requested consultation with the gastroenterologist, Dr. Rosalie,  and discussed lab and imaging findings as well as pertinent plan - they recommend: Hospitalist admission, will see in consult.  No need for NG tube at this time.   Problem List / ED Course / Critical  interventions / Medication management  Patient presenting for abdominal distention and discomfort in the setting of ongoing constipation over the past several months.  Vital signs on arrival are normal.  Patient is overall well-appearing on exam.  He does endorse some generalized abdominal tenderness.  No guarding is present.  Will obtain lab work and CT scans to assess for electrolytes and any other possible causes of his symptoms other than constipation.  Patient's lab work was notable for hyperglycemia with pseudohyponatremia.  Additional IV fluids were ordered.  On CT scan, patient had findings suggestive of sigmoid volvulus.  He did receive a small amount of enema but this was canceled.  I discussed CT findings with both general  surgery and gastroenterology.  Both will see in consult.  After Toradol , patient had ongoing pain.  Fentanyl  was ordered for analgesia.  After IV fluids, CBG improved to 443.  Correction dose of insulin  was ordered.  Patient was admitted for further management. I ordered medication including IV fluids for hydration; insulin  for hyperglycemia; Toradol  and fentanyl  for analgesia Reevaluation of the patient after these medicines showed that the patient improved I have reviewed the patients home medicines and have made adjustments as needed   Social Determinants of Health:  Lives independently        Final Clinical Impression(s) / ED Diagnoses Final diagnoses:  Sigmoid volvulus (HCC)  Hyperglycemia    Rx / DC Orders ED Discharge Orders     None         Melvenia Motto, MD 01/14/23 [phone]

## 2023-01-14 NOTE — Progress Notes (Signed)
 PROGRESS NOTE  Roger Woods FMW:[id number] DOB: 10-04-1970 DOA: 01/13/2023 PCP: [facility]  HPI/Recap of past 24 hours: Roger Woods is a 53 y.o. male with medical history significant of DM2, HLD, schizophrenia, bipolar disorder presented to the ED complaining of constipation and abdominal distention/pain intermittently for several months as per patient. He has tried over-the-counter laxatives and enemas without success, unsure when last he had a good BM. In the ED, labs fairly unremarkable, UA with >500 glucose, 5 ketones, and no signs of infection.  CT abdomen pelvis showing findings suspicious for sigmoid volvulus. General surgery and GI consulted. [facility] called to admit.     Today, patient denies any new complaints, still has not had any BM, still with abdominal tenderness and some mild distention.  Denies any nausea/vomiting, chest pain, fever/chills.    Assessment/Plan: Principal Problem:   Sigmoid volvulus (HCC) Active Problems:   Hyperglycemia   Type 2 diabetes mellitus (HCC)   HLD (hyperlipidemia)   Mood disorder (HCC)   Suspected ??sigmoid volvulus Currently afebrile, with no leukocytosis CT showing redundant sigmoid colon which twists around the sigmoid mesocolon. The twisted segment is narrowed with moderate colonic stool upstream. No dilation or wall thickening. Findings are suspicious but not definitive for sigmoid volvulus General Surgery consulted, unlikely sigmoid volvulus, recommend to consult GI S/p smog enema in the ED GI consulted, recommended holding off NG tube for now N.p.o., IV fluids Pain management   Type 2 diabetes melitis with hyperglycemia Not on any medication PTA Glucose 582 on initial labs without signs of DKA Last A1c was 6.1 in October 2023, repeat A1c pending SSI every 4 hours while NPO  Pseudohyponatremia Likely 2/2 hyperglycemia, glucose of greater than 500 Continue SSI, IV fluids Daily BMP   Hyperlipidemia Hold statin at  this time as patient is n.p.o.   Schizophrenia, bipolar disorder Does not appear to be on meds  Obesity type II Lifestyle modification advised     Estimated body mass index is 39.43 kg/m as calculated from the following:   Height as of this encounter: 5' 9 (1.753 m).   Weight as of this encounter: 121.1 kg.     Code Status: Full  Family Communication: Friend at bedside  Disposition Plan: Status is: Inpatient Remains inpatient appropriate because: Level of care      Consultants: General surgery GI  Procedures: None  Antimicrobials: None  DVT prophylaxis: SCDs for now   Objective: Vitals:   01/14/23 0634 01/14/23 0638 01/14/23 0800 01/14/23 1021  BP:  119/80 (!) 115/49   Pulse:  69 68   Resp:  13 13   Temp: 98.4 F (36.9 C)   98.2 F (36.8 C)  TempSrc: Oral   Oral  SpO2:  99% 94%   Weight:      Height:        Intake/Output Summary (Last 24 hours) at 01/14/2023 1038 Last data filed at 01/14/2023 0229 Gross per 24 hour  Intake 1000 ml  Output --  Net 1000 ml   Filed Weights   01/13/23 2316  Weight: 121.1 kg    Exam: General: NAD  Cardiovascular: S1, S2 present Respiratory: CTAB Abdomen: Soft, tender, distended, bowel sounds present Musculoskeletal: 1+ bilateral pedal edema noted Skin: Normal Psychiatry: Normal mood     Data Reviewed: CBC: Recent Labs  Lab 01/14/23 0016 01/14/23 0638  WBC 7.8 6.4  HGB 13.6 12.6*  HCT 38.5* 35.6*  MCV 87.5 87.5  PLT 224 222   Basic Metabolic  Panel: Recent Labs  Lab 01/14/23 0016  NA 126*  K 4.3  CL 93*  CO2 23  GLUCOSE 582*  BUN 14  CREATININE 0.99  CALCIUM  8.9  MG 2.3   GFR: Estimated Creatinine Clearance: 112.2 mL/min (by C-G formula based on SCr of 0.99 mg/dL). Liver Function Tests: Recent Labs  Lab 01/14/23 0016  AST 25  ALT 34  ALKPHOS 87  BILITOT 0.6  PROT 8.0  ALBUMIN 4.2   Recent Labs  Lab 01/14/23 0016  LIPASE 31   No results for input(s): AMMONIA in the last  168 hours. Coagulation Profile: No results for input(s): INR, PROTIME in the last 168 hours. Cardiac Enzymes: No results for input(s): CKTOTAL, CKMB, CKMBINDEX, TROPONINI in the last 168 hours. BNP (last 3 results) No results for input(s): PROBNP in the last 8760 hours. HbA1C: No results for input(s): HGBA1C in the last 72 hours. CBG: Recent Labs  Lab 01/14/23 0405 01/14/23 0743  GLUCAP 443* 260*   Lipid Profile: No results for input(s): CHOL, HDL, LDLCALC, TRIG, CHOLHDL, LDLDIRECT in the last 72 hours. Thyroid Function Tests: No results for input(s): TSH, T4TOTAL, FREET4, T3FREE, THYROIDAB in the last 72 hours. Anemia Panel: No results for input(s): VITAMINB12, FOLATE, FERRITIN, TIBC, IRON, RETICCTPCT in the last 72 hours. Urine analysis:    Component Value Date/Time   COLORURINE COLORLESS (A) 01/13/2023 2322   APPEARANCEUR CLEAR 01/13/2023 2322   LABSPEC 1.031 (H) 01/13/2023 2322   PHURINE 6.0 01/13/2023 2322   GLUCOSEU >=500 (A) 01/13/2023 2322   HGBUR NEGATIVE 01/13/2023 2322   BILIRUBINUR NEGATIVE 01/13/2023 2322   KETONESUR 5 (A) 01/13/2023 2322   PROTEINUR NEGATIVE 01/13/2023 2322   UROBILINOGEN 2.0 (H) 06/19/2021 1648   NITRITE NEGATIVE 01/13/2023 2322   LEUKOCYTESUR NEGATIVE 01/13/2023 2322   Sepsis Labs: @LABRCNTIP (procalcitonin:4,lacticidven:4)  )No results found for this or any previous visit (from the past 240 hours).    Studies: CT ABDOMEN PELVIS W CONTRAST Result Date: 01/14/2023 CLINICAL DATA:  Constipation. Abdominal pain. Enemas and laxatives not providing relief EXAM: CT ABDOMEN AND PELVIS WITH CONTRAST TECHNIQUE: Multidetector CT imaging of the abdomen and pelvis was performed using the standard protocol following bolus administration of intravenous contrast. RADIATION DOSE REDUCTION: This exam was performed according to the departmental dose-optimization program which includes automated exposure  control, adjustment of the mA and/or kV according to patient size and/or use of iterative reconstruction technique. CONTRAST:  OMNIPAQUE  IOHEXOL  300 MG/ML  SOLN COMPARISON:  02/26/2021 FINDINGS: Lower chest: No acute abnormality. Hepatobiliary: Hepatic steatosis. Normal gallbladder. No biliary dilation. Pancreas: Unremarkable. Spleen: Unremarkable. Adrenals/Urinary Tract: Normal adrenal glands. No urinary calculi or hydronephrosis. Bladder is unremarkable. Stomach/Bowel: Redundant sigmoid colon which twists around the sigmoid mesocolon (circa series 9/image 83-137). The course of the sigmoid colon is altered compared with 02/26/2021. The twisted segment sigmoid colon is decompressed. There is moderate colonic stool upstream from the twisted segment. No dilation. No bowel wall thickening. Stomach and appendix are within normal limits. Vascular/Lymphatic: No significant vascular findings are present. No enlarged abdominal or pelvic lymph nodes. Reproductive: Unremarkable. Other: No free intraperitoneal fluid or air. Musculoskeletal: No acute fracture. IMPRESSION: 1. Redundant sigmoid colon which twists around the sigmoid mesocolon. The twisted segment is narrowed with moderate colonic stool upstream. No dilation or wall thickening. Findings are suspicious but not definitive for sigmoid volvulus. Recommend surgical consult and low threshold for repeat imaging if there is clinical deterioration or increasing abdominal pain. 2. Hepatic steatosis. These results were called by telephone  at the time of interpretation on 01/14/2023 at 3:14 am to provider BERNARDINO FIREMAN , who verbally acknowledged these results. Electronically Signed   By: Norman Gatlin M.D.   On: 01/14/2023 03:17    Scheduled Meds:  insulin  aspart  0-9 Units Subcutaneous Q4H    Continuous Infusions:  sodium chloride  100 mL/hr at 01/14/23 0644     LOS: 0 days     Lebron JINNY Cage, MD [facility]  If 7PM-7AM, please contact  night-coverage [email or web address] 01/14/2023, 10:38 AM

## 2023-01-14 NOTE — Consult Note (Signed)
 [facility] Consultation Note  Referring Provider: [facility] Primary Care Physician:  [facility] Primary Gastroenterologist:  Sampson  Reason for Consultation:  Possible sigmoid volvulus  HPI: Roger Woods is a 53 y.o. male presenting progressive abdominal distention and constipation for several months.  He is unable to provide cogent history, but denies any acute onset abdominal pain.  Denies nausea, vomiting, blood in stool, fevers.  CT scan done showed redundant sigmoid colon and possible sigmoid volvulus.   Past Medical History:  Diagnosis Date   ACL tear    left leg   Bipolar disorder (HCC)    Chronic pain    Complication of anesthesia    Prolonged sedation   Diabetes mellitus without complication (HCC)    Edema    Headache(784.0)    Hemorrhoids    Insomnia due to medical condition    Psychosis (HCC)    Schizophrenia (HCC)    Weakness    occasionally in both arms and hands;related to neck issues    Past Surgical History:  Procedure Laterality Date   HARDWARE REMOVAL N/A 06/24/2013   Procedure: EXPLORATION/HARDWARE REMOVAL CERVICAL FOUR-FIVE, CERVICAL FIVE-SIX, AND CERVICAL SIX-SEVEN.;  Surgeon: Arley SHAUNNA Helling, MD;  Location: [facility];  Service: Neurosurgery;  Laterality: N/A;  posteriorpt. states hardware was not removed   PILONIDAL CYST EXCISION N/A 11/03/2021   Procedure: EXCISION OF PILONIDAL CYST;  Surgeon: Lyndel Deward PARAS, MD;  Location: [facility];  Service: General;  Laterality: N/A;   POSTERIOR CERVICAL FUSION/FORAMINOTOMY N/A 11/19/2012   Procedure: CERVICAL TWO TO CERVICAL SEVEN POSTERIOR CERVICAL FUSION/FORAMINOTOMY LEVEL 5;  Surgeon: Arley SHAUNNA Helling, MD;  Location: [facility];  Service: Neurosurgery;  Laterality: N/A;  C2-7 posteior cervical arthrodesis with instrumentation   SHOULDER ARTHROSCOPY WITH ROTATOR CUFF REPAIR AND SUBACROMIAL DECOMPRESSION Left 04/20/2020   Procedure: SHOULDER ARTHROSCOPY WITH MINI OPEN  ROTATOR CUFF  REPAIR AND SUBACROMIAL DECOMPRESSION;  Surgeon: Duwayne Purchase, MD;  Location: [facility];  Service: Orthopedics;  Laterality: Left;  90 MINS    Prior to Admission medications   Medication Sig Start Date End Date Taking? Authorizing Provider  acetaminophen  (TYLENOL ) 500 MG tablet Take 500-1,000 mg by mouth every 6 (six) hours as needed (pain.).   Yes [provider]  baclofen (LIORESAL) 10 MG tablet Take 10 mg by mouth daily as needed for muscle spasms.   Yes [provider]  docusate sodium  (COLACE) 100 MG capsule Take 100 mg by mouth daily as needed (constipation.).   Yes [provider]  furosemide  (LASIX ) 40 MG tablet Take 1 tablet (40 mg total) by mouth daily. 10/26/22 01/24/23 Yes Vicci Rollo SAUNDERS, PA-C  naloxone  (NARCAN ) nasal spray 4 mg/0.1 mL Place 0.4 mg into the nose as needed (opioid overdose).   Yes [provider]  Oxycodone  HCl 10 MG TABS Take 10 mg by mouth 5 (five) times daily. 09/19/21  Yes [provider]  potassium chloride  SA (KLOR-CON  M) 20 MEQ tablet Take 0.5 tablets (10 mEq total) by mouth 2 (two) times daily. 11/06/22  Yes Vicci Rollo SAUNDERS, PA-C  rosuvastatin  (CRESTOR ) 5 MG tablet Take 5 mg by mouth daily.   Yes [provider]  methylPREDNISolone  (MEDROL  DOSEPAK) 4 MG TBPK tablet Take as directed on packet. Patient not taking: Reported on 01/14/2023 06/28/22   Horton, Charmaine FALCON, MD  oxyCODONE -acetaminophen  (PERCOCET/ROXICET) 5-325 MG tablet Take 1 tablet by mouth every 6 (six) hours as needed for up to 5 days Patient not taking: Reported on 01/14/2023  12/20/21       Current Facility-Administered Medications  Medication Dose Route Frequency Provider Last Rate Last Admin   0.9 %  sodium chloride  infusion   Intravenous Continuous Ezenduka, Nkeiruka J, MD       acetaminophen  (TYLENOL ) tablet 650 mg  650 mg Oral Q6H PRN Rathore, Vasundhra, MD   650 mg at 01/14/23 9191   Or   acetaminophen  (TYLENOL ) suppository 650 mg  650  mg Rectal Q6H PRN Alfornia Madison, MD       fentaNYL  (SUBLIMAZE ) injection 25 mcg  25 mcg Intravenous Q2H PRN Rathore, Vasundhra, MD   25 mcg at 01/14/23 9355   insulin  aspart (novoLOG ) injection 0-9 Units  0-9 Units Subcutaneous Q4H Alfornia Madison, MD   5 Units at 01/14/23 9191   naloxone  (NARCAN ) injection 0.4 mg  0.4 mg Intravenous PRN Alfornia Madison, MD       Current Outpatient Medications  Medication Sig Dispense Refill   acetaminophen  (TYLENOL ) 500 MG tablet Take 500-1,000 mg by mouth every 6 (six) hours as needed (pain.).     baclofen (LIORESAL) 10 MG tablet Take 10 mg by mouth daily as needed for muscle spasms.     docusate sodium  (COLACE) 100 MG capsule Take 100 mg by mouth daily as needed (constipation.).     furosemide  (LASIX ) 40 MG tablet Take 1 tablet (40 mg total) by mouth daily. 90 tablet 3   naloxone  (NARCAN ) nasal spray 4 mg/0.1 mL Place 0.4 mg into the nose as needed (opioid overdose).     Oxycodone  HCl 10 MG TABS Take 10 mg by mouth 5 (five) times daily.     potassium chloride  SA (KLOR-CON  M) 20 MEQ tablet Take 0.5 tablets (10 mEq total) by mouth 2 (two) times daily. 90 tablet 3   rosuvastatin  (CRESTOR ) 5 MG tablet Take 5 mg by mouth daily.     methylPREDNISolone  (MEDROL  DOSEPAK) 4 MG TBPK tablet Take as directed on packet. (Patient not taking: Reported on 01/14/2023) 21 tablet 0   oxyCODONE -acetaminophen  (PERCOCET/ROXICET) 5-325 MG tablet Take 1 tablet by mouth every 6 (six) hours as needed for up to 5 days (Patient not taking: Reported on 01/14/2023) 20 tablet 0    Allergies as of 01/13/2023 - Review Complete 01/13/2023  Allergen Reaction Noted   Hydrocodone  Itching and Other (See Comments) 04/07/2020   Penicillins Other (See Comments) 01/30/2012    Family History  Problem Relation Age of Onset   High blood pressure Maternal Grandmother    Cancer Other     Social History   Socioeconomic History   Marital status: Divorced    Spouse name: Not on file    Number of children: 7   Years of education: GED   Highest education level: Not on file  Occupational History   Not on file  Tobacco Use   Smoking status: Some Days    Current packs/day: 1.00    Average packs/day: 1 pack/day for 7.0 years (7.0 ttl pk-yrs)    Types: Cigarettes   Smokeless tobacco: Never  Vaping Use   Vaping status: Never Used  Substance and Sexual Activity   Alcohol use: Not Currently   Drug use: No   Sexual activity: Yes  Other Topics Concern   Not on file  Social History Narrative   Patient is single and lives alone.   Patient has 7 children.   Patient is disabled.   Patient has his GED.   Patient is right-handed.   Patient drinks some caffeine but not  everyday.   Social Drivers of Corporate Investment Banker Strain: Not on file  Food Insecurity: Not on file  Transportation Needs: Not on file  Physical Activity: Not on file  Stress: Not on file  Social Connections: Unknown (05/22/2021)   Received from [facility]   Social Network    Social Network: Not on file  Intimate Partner Violence: Unknown (04/13/2021)   Received from [facility], [facility]   HITS    Physically Hurt: Not on file    Insult or Talk Down To: Not on file    Threaten Physical Harm: Not on file    Scream or Curse: Not on file    Review of Systems: As per HPI, all others negative  Physical Exam: Vital signs in last 24 hours: Temp:  [98.2 F (36.8 C)-98.6 F (37 C)] 98.2 F (36.8 C) (01/06 1021) Pulse Rate:  [64-87] 68 (01/06 0800) Resp:  [11-18] 13 (01/06 0800) BP: (111-134)/(49-93) 115/49 (01/06 0800) SpO2:  [94 %-99 %] 94 % (01/06 0800) Weight:  [121.1 kg] 121.1 kg (01/05 2316) Last BM Date :  (unknown) General:   Alert,  Well-developed, well-nourished, pleasant and cooperative in NAD Head:  Normocephalic and atraumatic. Eyes:  Sclera clear, no icterus.   Conjunctiva pink. Ears:  Normal auditory acuity. Nose:  No deformity, discharge,  or  lesions. Mouth:  No deformity or lesions.  Oropharynx pink & moist. Neck:  Supple; no masses or thyromegaly. Lungs:  No respiratory distress Abdomen:  Soft, mild distended and tympanic, no peritonitis. No masses, hepatosplenomegaly or hernias noted. Normal bowel sounds, without guarding, and without rebound.     Msk:  Symmetrical without gross deformities. Normal posture. Pulses:  Normal pulses noted. Extremities:  Without clubbing or edema. Neurologic:  Alert and  oriented x4;  grossly normal neurologically. Skin:  Intact without significant lesions or rashes. Psych:  Alert and cooperative. Normal mood and affect.   Lab Results: Recent Labs    01/14/23 0016 01/14/23 0638  WBC 7.8 6.4  HGB 13.6 12.6*  HCT 38.5* 35.6*  PLT 224 222   BMET Recent Labs    01/14/23 0016  NA 126*  K 4.3  CL 93*  CO2 23  GLUCOSE 582*  BUN 14  CREATININE 0.99  CALCIUM  8.9   LFT Recent Labs    01/14/23 0016  PROT 8.0  ALBUMIN 4.2  AST 25  ALT 34  ALKPHOS 87  BILITOT 0.6   PT/INR No results for input(s): LABPROT, INR in the last 72 hours.  Studies/Results: CT ABDOMEN PELVIS W CONTRAST Result Date: 01/14/2023 CLINICAL DATA:  Constipation. Abdominal pain. Enemas and laxatives not providing relief EXAM: CT ABDOMEN AND PELVIS WITH CONTRAST TECHNIQUE: Multidetector CT imaging of the abdomen and pelvis was performed using the standard protocol following bolus administration of intravenous contrast. RADIATION DOSE REDUCTION: This exam was performed according to the departmental dose-optimization program which includes automated exposure control, adjustment of the mA and/or kV according to patient size and/or use of iterative reconstruction technique. CONTRAST:  OMNIPAQUE  IOHEXOL  300 MG/ML  SOLN COMPARISON:  02/26/2021 FINDINGS: Lower chest: No acute abnormality. Hepatobiliary: Hepatic steatosis. Normal gallbladder. No biliary dilation. Pancreas: Unremarkable. Spleen: Unremarkable.  Adrenals/Urinary Tract: Normal adrenal glands. No urinary calculi or hydronephrosis. Bladder is unremarkable. Stomach/Bowel: Redundant sigmoid colon which twists around the sigmoid mesocolon (circa series 9/image 83-137). The course of the sigmoid colon is altered compared with 02/26/2021. The twisted segment sigmoid colon is decompressed. There is moderate  colonic stool upstream from the twisted segment. No dilation. No bowel wall thickening. Stomach and appendix are within normal limits. Vascular/Lymphatic: No significant vascular findings are present. No enlarged abdominal or pelvic lymph nodes. Reproductive: Unremarkable. Other: No free intraperitoneal fluid or air. Musculoskeletal: No acute fracture. IMPRESSION: 1. Redundant sigmoid colon which twists around the sigmoid mesocolon. The twisted segment is narrowed with moderate colonic stool upstream. No dilation or wall thickening. Findings are suspicious but not definitive for sigmoid volvulus. Recommend surgical consult and low threshold for repeat imaging if there is clinical deterioration or increasing abdominal pain. 2. Hepatic steatosis. These results were called by telephone at the time of interpretation on 01/14/2023 at 3:14 am to provider [facility] , who verbally acknowledged these results. Electronically Signed   By: Norman Gatlin M.D.   On: 01/14/2023 03:17    Impression:   Progressive abdominal distention x months. Imaging report raises suspicion of sigmoid volvulus but clinical story not typical of this. Constipation/obstipation. Multiple medical problems.  Plan:   Correcting hyperglycemia (in progress). NPO. Unprepped sigmoidoscopy for further evaluation today. Surgery consultation. If no clear evidence of sigmoid volvulus, would consider enemas and then slow peroral bowel preparation.   Risks (bleeding, infection, bowel perforation that could require surgery, sedation-related changes in cardiopulmonary systems), benefits  (identification and possible treatment of source of symptoms, exclusion of certain causes of symptoms), and alternatives (watchful waiting, radiographic imaging studies, empiric medical treatment) of sigmoidoscopy were explained to patient/family in detail and patient wishes to proceed.  [facility] will follow.   LOS: 0 days   Glennie Rodda M  01/14/2023, 10:59 AM  Cell [phone] If no answer or after 5 PM call [phone]

## 2023-01-14 NOTE — ED Notes (Signed)
 Gave pt a mango icee

## 2023-01-15 ENCOUNTER — Other Ambulatory Visit (HOSPITAL_COMMUNITY): Payer: Self-pay

## 2023-01-15 ENCOUNTER — Telehealth (HOSPITAL_COMMUNITY): Payer: Self-pay | Admitting: Pharmacy Technician

## 2023-01-15 ENCOUNTER — Inpatient Hospital Stay (HOSPITAL_COMMUNITY): Payer: MEDICAID

## 2023-01-15 DIAGNOSIS — K562 Volvulus: Secondary | ICD-10-CM | POA: Diagnosis not present

## 2023-01-15 LAB — BASIC METABOLIC PANEL
Anion gap: 10 (ref 5–15)
BUN: 9 mg/dL (ref 6–20)
CO2: 19 mmol/L — ABNORMAL LOW (ref 22–32)
Calcium: 8.7 mg/dL — ABNORMAL LOW (ref 8.9–10.3)
Chloride: 101 mmol/L (ref 98–111)
Creatinine, Ser: 0.72 mg/dL (ref 0.61–1.24)
GFR, Estimated: >60 mL/min (ref >60)
Glucose, Bld: 200 mg/dL — ABNORMAL HIGH (ref 70–99)
Potassium: 4.3 mmol/L (ref 3.5–5.1)
Sodium: 130 mmol/L — ABNORMAL LOW (ref 135–145)

## 2023-01-15 LAB — GLUCOSE, CAPILLARY
Glucose-Capillary: 311 mg/dL — ABNORMAL HIGH (ref 70–99)
Glucose-Capillary: 486 mg/dL — ABNORMAL HIGH (ref 70–99)
Glucose-Capillary: 234 mg/dL — ABNORMAL HIGH (ref 70–99)
Glucose-Capillary: 72 mg/dL (ref 70–99)
Glucose-Capillary: 275 mg/dL — ABNORMAL HIGH (ref 70–99)

## 2023-01-15 LAB — CBC
HCT: 31.2 % — ABNORMAL LOW (ref 39.0–52.0)
Hemoglobin: 10.6 g/dL — ABNORMAL LOW (ref 13.0–17.0)
MCH: 30.7 pg (ref 26.0–34.0)
MCHC: 34 g/dL (ref 30.0–36.0)
MCV: 90.4 fL (ref 80.0–100.0)
Platelets: 178 10*3/uL (ref 150–400)
RBC: 3.45 MIL/uL — ABNORMAL LOW (ref 4.22–5.81)
RDW: 12.7 % (ref 11.5–15.5)
WBC: 5.8 10*3/uL (ref 4.0–10.5)
nRBC: 0 % (ref 0.0–0.2)

## 2023-01-15 LAB — HEMOGLOBIN A1C
Hgb A1c MFr Bld: 12.2 % — ABNORMAL HIGH (ref 4.8–5.6)
Mean Plasma Glucose: 303 mg/dL

## 2023-01-15 LAB — CBG MONITORING, ED
Glucose-Capillary: 84 mg/dL (ref 70–99)
Glucose-Capillary: 160 mg/dL — ABNORMAL HIGH (ref 70–99)

## 2023-01-15 MED ORDER — POLYETHYLENE GLYCOL 3350 17 GM/SCOOP PO POWD
1.0000 | Freq: Once | ORAL | Status: AC
  Administered 2023-01-15: 255 g via ORAL
  Filled 2023-01-15: qty 255

## 2023-01-15 MED ORDER — ENOXAPARIN SODIUM 40 MG/0.4ML IJ SOSY
40.0000 mg | PREFILLED_SYRINGE | INTRAMUSCULAR | Status: DC
Start: 2023-01-15 — End: 2023-01-17
  Administered 2023-01-15 – 2023-01-16 (×2): 40 mg via SUBCUTANEOUS
  Filled 2023-01-15 (×2): qty 0.4

## 2023-01-15 MED ORDER — INSULIN ASPART 100 UNIT/ML IJ SOLN
10.0000 [IU] | Freq: Once | INTRAMUSCULAR | Status: AC
  Administered 2023-01-15: 10 [IU] via SUBCUTANEOUS

## 2023-01-15 MED ORDER — LIVING WELL WITH DIABETES BOOK
Freq: Once | Status: DC
  Filled 2023-01-15: qty 1

## 2023-01-15 NOTE — Progress Notes (Signed)
 Subjective: No bowel movement after dulcolax suppositories or SMOG enemas.  Objective: Vital signs in last 24 hours: Temp:  [97.4 F (36.3 C)-99.3 F (37.4 C)] 97.6 F (36.4 C) (01/07 0853) Pulse Rate:  [41-78] 78 (01/07 0853) Resp:  [10-19] 16 (01/07 0853) BP: (89-133)/(32-90) 128/90 (01/07 0853) SpO2:  [96 %-100 %] 100 % (01/07 0853) Weight:  [121.1 kg] 121.1 kg (01/06 1159) Weight change: 0 kg Last BM Date :  (unable to recall)  PE: GEN:  NAD ABD:  Mild distended, mild tenderness, no peritonitis  Lab Results: CBC    Component Value Date/Time   WBC 5.8 01/15/2023 0538   RBC 3.45 (L) 01/15/2023 0538   HGB 10.6 (L) 01/15/2023 0538   HGB 13.7 10/26/2022 1603   HCT 31.2 (L) 01/15/2023 0538   HCT 40.4 10/26/2022 1603   PLT 178 01/15/2023 0538   PLT 234 10/26/2022 1603   MCV 90.4 01/15/2023 0538   MCV 91 10/26/2022 1603   MCH 30.7 01/15/2023 0538   MCHC 34.0 01/15/2023 0538   RDW 12.7 01/15/2023 0538   RDW 12.7 10/26/2022 1603   LYMPHSABS 1.0 07/08/2021 0609   MONOABS 0.5 07/08/2021 0609   EOSABS 0.3 07/08/2021 0609   BASOSABS 0.0 07/08/2021 0609  CMP     Component Value Date/Time   NA 130 (L) 01/15/2023 0941   NA 138 11/06/2022 1605   K 4.3 01/15/2023 0941   CL 101 01/15/2023 0941   CO2 19 (L) 01/15/2023 0941   GLUCOSE 200 (H) 01/15/2023 0941   BUN 9 01/15/2023 0941   BUN 9 11/06/2022 1605   CREATININE 0.72 01/15/2023 0941   CALCIUM  8.7 (L) 01/15/2023 0941   PROT 8.0 01/14/2023 0016   ALBUMIN 4.2 01/14/2023 0016   AST 25 01/14/2023 0016   ALT 34 01/14/2023 0016   ALKPHOS 87 01/14/2023 0016   BILITOT 0.6 01/14/2023 0016   EGFR 95 11/06/2022 1605   GFRNONAA >60 01/15/2023 0941    Assessment:   Constipation/obstipation in setting of chronic narcotics for chronic pain.  Imaging yesterday reported possible volvulus but imaging on my review and surgical team review not typical of volvulus.  Sigmoidoscopy yesterday, limited due to abundant stool, did not  show typical twist or other findings suspicious for volvulus and Xray today shows non-obstructive bowel gas pattern.  Plan:   Proceed with slow peroral bowel preparation. Minimize narcotics as clinically feasible. Slow miralax /gatorade peroral preparation. Clear liquid diet only. [facility] will follow.   Roger Woods 01/15/2023, 11:29 AM   Cell [phone] If no answer or after 5 PM call [phone]

## 2023-01-15 NOTE — ED Notes (Signed)
 ED TO INPATIENT HANDOFF REPORT  Name/Age/Gender Roger Woods 53 y.o. 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  May admit patient to [facility] or [facility] if equivalent level of care is available:: Yes  Covid Evaluation: Asymptomatic - no recent exposure (last 10 days) testing not required  Diagnosis: Sigmoid volvulus [facility]) [[id number]]  Admitting Physician: ALFORNIA MADISON [[id number]]  Attending Physician: RATHORE, VASUNDHRA [[id number]]  Certification:: I certify this patient will need inpatient services for at least 2 midnights  Expected Medical Readiness: 01/16/2023          Medical History Past Medical History:  Diagnosis Date   ACL tear    left leg   Bipolar disorder (HCC)    Chronic pain    Complication of anesthesia    Prolonged sedation   Diabetes mellitus without complication (HCC)    Edema    Headache(784.0)    Hemorrhoids    Insomnia due to medical condition    Psychosis (HCC)    Schizophrenia (HCC)     Weakness    occasionally in both arms and hands;related to neck issues    Allergies Allergies  Allergen Reactions   Hydrocodone  Itching and Other (See Comments)    Throat itching    Penicillins Other (See Comments)    Unknown reaction Has patient had a PCN reaction causing immediate rash, facial/tongue/throat swelling, SOB or lightheadedness with hypotension: Yes Has patient had a PCN reaction causing severe rash involving mucus membranes or skin necrosis: Yes Has patient had a PCN reaction that required hospitalization No Has patient had a PCN reaction occurring within the last 10 years: No If all of the above answers are NO, then may proceed with Cephalosporin use.     IV Location/Drains/Wounds Patient Lines/Drains/Airways Status     Active Line/Drains/Airways     Name Placement date Placement time Site Days   Peripheral IV 01/14/23 20 G 1.88 Anterior;Right;Upper Arm 01/14/23  0121  Arm  1            Labs/Imaging Results for orders placed or performed during the hospital encounter of 01/13/23 (from the past 48 hours)  Urinalysis, Routine w reflex microscopic -Urine, Clean Catch     Status: Abnormal   Collection Time: 01/13/23 11:22 PM  Result Value Ref Range   Color, Urine COLORLESS (A) YELLOW   APPearance CLEAR CLEAR   Specific  Gravity, Urine 1.031 (H) 1.005 - 1.030   pH 6.0 5.0 - 8.0   Glucose, UA >=500 (A) NEGATIVE mg/dL   Hgb urine dipstick NEGATIVE NEGATIVE   Bilirubin Urine NEGATIVE NEGATIVE   Ketones, ur 5 (A) NEGATIVE mg/dL   Protein, ur NEGATIVE NEGATIVE mg/dL   Nitrite NEGATIVE NEGATIVE   Leukocytes,Ua NEGATIVE NEGATIVE   RBC / HPF 0-5 0 - 5 RBC/hpf   WBC, UA 0-5 0 - 5 WBC/hpf   Bacteria, UA NONE SEEN NONE SEEN   Squamous Epithelial / HPF 0-5 0 - 5 /HPF    Comment: Performed at [facility], [address] [place], [place] [address]  Lipase, blood     Status: None   Collection Time: 01/14/23 12:16 AM  Result Value Ref Range    Lipase 31 11 - 51 U/L    Comment: Performed at [facility], [address], [place], [place] [address]  Comprehensive metabolic panel     Status: Abnormal   Collection Time: 01/14/23 12:16 AM  Result Value Ref Range   Sodium 126 (L) 135 - 145 mmol/L   Potassium 4.3 3.5 - 5.1 mmol/L   Chloride 93 (L) 98 - 111 mmol/L   CO2 23 22 - 32 mmol/L   Glucose, Bld 582 (HH) 70 - 99 mg/dL    Comment: CRITICAL RESULT CALLED TO, READ BACK BY AND VERIFIED WITH Raynaldo Falco D. @ 0211 01/14/2023  MCLEAN K. Glucose reference range applies only to samples taken after fasting for at least 8 hours.    BUN 14 6 - 20 mg/dL   Creatinine, Ser 9.00 0.61 - 1.24 mg/dL   Calcium  8.9 8.9 - 10.3 mg/dL   Total Protein 8.0 6.5 - 8.1 g/dL   Albumin 4.2 3.5 - 5.0 g/dL   AST 25 15 - 41 U/L   ALT 34 0 - 44 U/L   Alkaline Phosphatase 87 38 - 126 U/L   Total Bilirubin 0.6 0.0 - 1.2 mg/dL   GFR, Estimated >39 >39 mL/min    Comment: (NOTE) Calculated using the CKD-EPI Creatinine Equation (2021)    Anion gap 10 5 - 15    Comment: Performed at [facility], [address], [place], [place] [address]  CBC     Status: Abnormal   Collection Time: 01/14/23 12:16 AM  Result Value Ref Range   WBC 7.8 4.0 - 10.5 K/uL   RBC 4.40 4.22 - 5.81 MIL/uL   Hemoglobin 13.6 13.0 - 17.0 g/dL   HCT 61.4 (L) 60.9 - 47.9 %   MCV 87.5 80.0 - 100.0 fL   MCH 30.9 26.0 - 34.0 pg   MCHC 35.3 30.0 - 36.0 g/dL   RDW 87.5 88.4 - 84.4 %   Platelets 224 150 - 400 K/uL   nRBC 0.0 0.0 - 0.2 %    Comment: Performed at [facility], [address], [place], [place] [address]  Magnesium     Status: None   Collection Time: 01/14/23 12:16 AM  Result Value Ref Range   Magnesium 2.3 1.7 - 2.4 mg/dL    Comment: Performed at [facility], [address], [place], [place] [address]  CBG monitoring, ED     Status: Abnormal   Collection Time: 01/14/23  4:05 AM  Result Value Ref Range    Glucose-Capillary 443 (H) 70 - 99 mg/dL    Comment: Glucose reference range applies only to samples taken after fasting for at least  8 hours.  Lactic acid, plasma     Status: None   Collection Time: 01/14/23  6:38 AM  Result Value Ref Range   Lactic Acid, Venous 0.9 0.5 - 1.9 mmol/L    Comment: Performed at [facility], [address], [place], [place] [address]  Hemoglobin A1c     Status: Abnormal   Collection Time: 01/14/23  6:38 AM  Result Value Ref Range   Hgb A1c MFr Bld 12.2 (H) 4.8 - 5.6 %    Comment: (NOTE)         Prediabetes: 5.7 - 6.4         Diabetes: >6.4         Glycemic control for adults with diabetes: <7.0    Mean Plasma Glucose 303 mg/dL    Comment: (NOTE) Performed At: [facility] [address] [place], [place] [id number] Jennette Shorter MD Ey:[id number]   Osmolality     Status: Abnormal   Collection Time: 01/14/23  6:38 AM  Result Value Ref Range   Osmolality 297 (H) 275 - 295 mOsm/kg    Comment: Performed at [facility], [address], [place], [place] [address]  CBC     Status: Abnormal   Collection Time: 01/14/23  6:38 AM  Result Value Ref Range   WBC 6.4 4.0 - 10.5 K/uL   RBC 4.07 (L) 4.22 - 5.81 MIL/uL   Hemoglobin 12.6 (L) 13.0 - 17.0 g/dL   HCT 64.3 (L) 60.9 - 47.9 %   MCV 87.5 80.0 - 100.0 fL   MCH 31.0 26.0 - 34.0 pg   MCHC 35.4 30.0 - 36.0 g/dL   RDW 87.5 88.4 - 84.4 %   Platelets 222 150 - 400 K/uL   nRBC 0.0 0.0 - 0.2 %    Comment: Performed at [facility], [address], [place], [place] [address]  CBG monitoring, ED     Status: Abnormal   Collection Time: 01/14/23  7:43 AM  Result Value Ref Range   Glucose-Capillary 260 (H) 70 - 99 mg/dL    Comment: Glucose reference range applies only to samples taken after fasting for at least 8 hours.  I-STAT, chem 8     Status: Abnormal   Collection Time: 01/14/23 12:20 PM  Result Value Ref Range   Sodium 140 135 - 145 mmol/L    Potassium 3.3 (L) 3.5 - 5.1 mmol/L   Chloride 101 98 - 111 mmol/L   BUN 12 6 - 20 mg/dL   Creatinine, Ser 8.99 0.61 - 1.24 mg/dL   Glucose, Bld 899 (H) 70 - 99 mg/dL    Comment: Glucose reference range applies only to samples taken after fasting for at least 8 hours.   Calcium , Ion 1.24 1.15 - 1.40 mmol/L   TCO2 28 22 - 32 mmol/L   Hemoglobin 12.9 (L) 13.0 - 17.0 g/dL   HCT 61.9 (L) 60.9 - 47.9 %  CBG monitoring, ED     Status: Abnormal   Collection Time: 01/14/23  3:09 PM  Result Value Ref Range   Glucose-Capillary 193 (H) 70 - 99 mg/dL    Comment: Glucose reference range applies only to samples taken after fasting for at least 8 hours.  CBG monitoring, ED     Status: Abnormal   Collection Time: 01/14/23  7:39 PM  Result Value Ref Range   Glucose-Capillary 350 (H) 70 - 99 mg/dL    Comment: Glucose reference range applies only to samples taken  after fasting for at least 8 hours.  CBG monitoring, ED     Status: Abnormal   Collection Time: 01/14/23 11:34 PM  Result Value Ref Range   Glucose-Capillary 262 (H) 70 - 99 mg/dL    Comment: Glucose reference range applies only to samples taken after fasting for at least 8 hours.  CBG monitoring, ED     Status: None   Collection Time: 01/15/23  3:51 AM  Result Value Ref Range   Glucose-Capillary 84 70 - 99 mg/dL    Comment: Glucose reference range applies only to samples taken after fasting for at least 8 hours.  CBC     Status: Abnormal   Collection Time: 01/15/23  5:38 AM  Result Value Ref Range   WBC 5.8 4.0 - 10.5 K/uL   RBC 3.45 (L) 4.22 - 5.81 MIL/uL   Hemoglobin 10.6 (L) 13.0 - 17.0 g/dL   HCT 68.7 (L) 60.9 - 47.9 %   MCV 90.4 80.0 - 100.0 fL   MCH 30.7 26.0 - 34.0 pg   MCHC 34.0 30.0 - 36.0 g/dL   RDW 87.2 88.4 - 84.4 %   Platelets 178 150 - 400 K/uL   nRBC 0.0 0.0 - 0.2 %    Comment: Performed at [facility], [address], [place], [place] [address]   CT ABDOMEN PELVIS W CONTRAST Result Date:  01/14/2023 CLINICAL DATA:  Constipation. Abdominal pain. Enemas and laxatives not providing relief EXAM: CT ABDOMEN AND PELVIS WITH CONTRAST TECHNIQUE: Multidetector CT imaging of the abdomen and pelvis was performed using the standard protocol following bolus administration of intravenous contrast. RADIATION DOSE REDUCTION: This exam was performed according to the departmental dose-optimization program which includes automated exposure control, adjustment of the mA and/or kV according to patient size and/or use of iterative reconstruction technique. CONTRAST:  OMNIPAQUE  IOHEXOL  300 MG/ML  SOLN COMPARISON:  02/26/2021 FINDINGS: Lower chest: No acute abnormality. Hepatobiliary: Hepatic steatosis. Normal gallbladder. No biliary dilation. Pancreas: Unremarkable. Spleen: Unremarkable. Adrenals/Urinary Tract: Normal adrenal glands. No urinary calculi or hydronephrosis. Bladder is unremarkable. Stomach/Bowel: Redundant sigmoid colon which twists around the sigmoid mesocolon (circa series 9/image 83-137). The course of the sigmoid colon is altered compared with 02/26/2021. The twisted segment sigmoid colon is decompressed. There is moderate colonic stool upstream from the twisted segment. No dilation. No bowel wall thickening. Stomach and appendix are within normal limits. Vascular/Lymphatic: No significant vascular findings are present. No enlarged abdominal or pelvic lymph nodes. Reproductive: Unremarkable. Other: No free intraperitoneal fluid or air. Musculoskeletal: No acute fracture. IMPRESSION: 1. Redundant sigmoid colon which twists around the sigmoid mesocolon. The twisted segment is narrowed with moderate colonic stool upstream. No dilation or wall thickening. Findings are suspicious but not definitive for sigmoid volvulus. Recommend surgical consult and low threshold for repeat imaging if there is clinical deterioration or increasing abdominal pain. 2. Hepatic steatosis. These results were called by  telephone at the time of interpretation on 01/14/2023 at 3:14 am to provider [facility] , who verbally acknowledged these results. Electronically Signed   By: Norman Gatlin M.D.   On: 01/14/2023 03:17    Pending Labs Unresulted Labs (From admission, onward)     Start     Ordered   01/15/23 0520  Basic metabolic panel  Once,   R        01/15/23 0520   01/15/23 0500  CBC  Daily,   R      01/14/23 1057   01/15/23 0500  Basic metabolic panel  Daily,   R      01/14/23 1057   01/14/23 0622  HIV Antibody (routine testing w rflx)  (HIV Antibody (Routine testing w reflex) panel)  Once,   R        01/14/23 0623            Vitals/Pain Today's Vitals   01/15/23 0352 01/15/23 0355 01/15/23 0520 01/15/23 0600  BP:  129/80 113/70   Pulse:  64 62   Resp:  16 17   Temp:   98.7 F (37.1 C)   TempSrc:   Oral   SpO2:  100% 97%   Weight:      Height:      PainSc: 5    0-No pain    Isolation Precautions No active isolations  Medications Medications  acetaminophen  (TYLENOL ) tablet 650 mg ( Oral MAR Unhold 01/14/23 1402)    Or  acetaminophen  (TYLENOL ) suppository 650 mg ( Rectal MAR Unhold 01/14/23 1402)  insulin  aspart (novoLOG ) injection 0-9 Units ( Subcutaneous Not Given 01/15/23 0352)  naloxone  (NARCAN ) injection 0.4 mg ( Intravenous MAR Unhold 01/14/23 1402)  0.9 %  sodium chloride  infusion ( Intravenous New Bag/Given 01/15/23 0549)  sorbitol , magnesium hydroxide, mineral oil, glycerin (SMOG) enema (400 mLs Rectal Not Given 01/14/23 1905)  polyethylene glycol (MIRALAX  / GLYCOLAX ) packet 17 g (17 g Oral Given 01/14/23 2217)  senna-docusate (Senokot-S) tablet 1 tablet (1 tablet Oral Given 01/14/23 2217)  oxyCODONE  (Oxy IR/ROXICODONE ) immediate release tablet 5 mg (5 mg Oral Given 01/15/23 0359)  lactated ringers  bolus 1,000 mL (0 mLs Intravenous Stopped 01/14/23 0229)  sorbitol , magnesium hydroxide, mineral oil, glycerin (SMOG) enema (960 mLs Rectal Given 01/14/23 0318)  ketorolac  (TORADOL ) 15 MG/ML  injection 15 mg (15 mg Intravenous Given 01/14/23 0133)  lactated ringers  bolus 1,000 mL (0 mLs Intravenous Stopped 01/14/23 0630)  iohexol  (OMNIPAQUE ) 300 MG/ML solution 100 mL (100 mLs Intravenous Contrast Given 01/14/23 0241)  insulin  aspart (novoLOG ) injection 10 Units (10 Units Subcutaneous Given 01/14/23 0409)  fentaNYL  (SUBLIMAZE ) injection 100 mcg (100 mcg Intravenous Given 01/14/23 0425)  bisacodyl  (DULCOLAX) suppository 10 mg (10 mg Rectal Given 01/14/23 2226)    Mobility walks with person assist

## 2023-01-15 NOTE — Telephone Encounter (Signed)
 Patient Product/process Development Scientist completed.    The patient is insured through [employer].     Ran test claim for Lantus  Pen and the current 30 day co-pay is $4.00.  Ran test claim for Novolog  FlexPen and the current 30 day co-pay is $4.00.  This test claim was processed through [facility]- copay amounts may vary at other pharmacies due to pharmacy/plan contracts, or as the patient moves through the different stages of their insurance plan.     Reyes Sharps, CPHT Pharmacy Technician III Certified Patient Advocate [facility] Patient Advocate Team Direct Number: [phone]  Fax: [phone]

## 2023-01-15 NOTE — Anesthesia Postprocedure Evaluation (Signed)
 Anesthesia Post Note  Patient: Roger Woods  Procedure(s) Performed: FLEXIBLE SIGMOIDOSCOPY (Left)     Patient location during evaluation: Endoscopy Anesthesia Type: MAC Level of consciousness: awake Pain management: pain level controlled Vital Signs Assessment: post-procedure vital signs reviewed and stable Respiratory status: spontaneous breathing, nonlabored ventilation and respiratory function stable Cardiovascular status: blood pressure returned to baseline and stable Postop Assessment: no apparent nausea or vomiting Anesthetic complications: no   No notable events documented.  Last Vitals:  Vitals:   01/15/23 0520 01/15/23 0600  BP: 113/70 119/66  Pulse: 62 61  Resp: 17 15  Temp: 37.1 C   SpO2: 97% 100%    Last Pain:  Vitals:   01/15/23 0600  TempSrc:   PainSc: 0-No pain                 Esteven Overfelt P Jowan Skillin

## 2023-01-15 NOTE — Progress Notes (Signed)
 PROGRESS NOTE  Roger Woods FMW:[id number] DOB: Jan 11, 1970 DOA: 01/13/2023 PCP: [facility]  HPI/Recap of past 24 hours: Roger Woods is a 53 y.o. male with medical history significant of DM2, HLD, schizophrenia, bipolar disorder presented to the ED complaining of constipation and abdominal distention/pain intermittently for several months as per patient. He has tried over-the-counter laxatives and enemas without success, unsure when last he had a good BM. In the ED, labs fairly unremarkable, UA with >500 glucose, 5 ketones, and no signs of infection.  CT abdomen pelvis showing findings suspicious for sigmoid volvulus. General surgery and GI consulted. TRH called to admit.     Still no BM.  Still with abdominal discomfort.  Requesting to eat.     Assessment/Plan: Principal Problem:   Sigmoid volvulus (HCC) Active Problems:   Hyperglycemia   Type 2 diabetes mellitus (HCC)   HLD (hyperlipidemia)   Mood disorder (HCC)   Severe constipation/stool impaction Currently afebrile, with no leukocytosis CT showing redundant sigmoid colon which twists around the sigmoid mesocolon. The twisted segment is narrowed with moderate colonic stool upstream. No dilation or wall thickening. Findings are suspicious but not definitive for sigmoid volvulus General Surgery consulted, unlikely sigmoid volvulus, signed off GI consulted, status post flex sig on 01/14/2023 with noted stool impaction, no evidence of sigmoid volvulus GI following, recommend bowel prep, clear liquid diet   Type 2 diabetes melitis with hyperglycemia Not on any medication PTA Glucose 582 on initial labs without signs of DKA A1c now 12.2 Diabetes coordinator consulted SSI every 4 hours while on clear liquid diet  Pseudohyponatremia Likely 2/2 hyperglycemia, glucose of greater than 500 Daily BMP   Hyperlipidemia Hold statin   Schizophrenia, bipolar disorder Does not appear to be on meds  Obesity type  II Lifestyle modification advised     Estimated body mass index is 39.43 kg/m as calculated from the following:   Height as of this encounter: 5' 9 (1.753 m).   Weight as of this encounter: 121.1 kg.     Code Status: Full  Family Communication: Friend at bedside  Disposition Plan: Status is: Inpatient Remains inpatient appropriate because: Level of care      Consultants: General surgery GI  Procedures: Flex sig on 01/14/2023  Antimicrobials: None  DVT prophylaxis: Lovenox    Objective: Vitals:   01/15/23 0520 01/15/23 0600 01/15/23 0853 01/15/23 1322  BP: 113/70 119/66 (!) 128/90 122/76  Pulse: 62 61 78 64  Resp: 17 15 16 18   Temp: 98.7 F (37.1 C)  97.6 F (36.4 C) 98.2 F (36.8 C)  TempSrc: Oral  Oral Oral  SpO2: 97% 100% 100% 97%  Weight:      Height:        Intake/Output Summary (Last 24 hours) at 01/15/2023 1421 Last data filed at 01/15/2023 0002 Gross per 24 hour  Intake --  Output 200 ml  Net -200 ml   Filed Weights   01/13/23 2316 01/14/23 1159  Weight: 121.1 kg 121.1 kg    Exam: General: NAD  Cardiovascular: S1, S2 present Respiratory: CTAB Abdomen: Soft, tender, distended, bowel sounds present Musculoskeletal: 1+ bilateral pedal edema noted Skin: Normal Psychiatry: Normal mood     Data Reviewed: CBC: Recent Labs  Lab 01/14/23 0016 01/14/23 0638 01/14/23 1220 01/15/23 0538  WBC 7.8 6.4  --  5.8  HGB 13.6 12.6* 12.9* 10.6*  HCT 38.5* 35.6* 38.0* 31.2*  MCV 87.5 87.5  --  90.4  PLT 224 222  --  178   Basic Metabolic Panel: Recent Labs  Lab 01/14/23 0016 01/14/23 1220 01/15/23 0941  NA 126* 140 130*  K 4.3 3.3* 4.3  CL 93* 101 101  CO2 23  --  19*  GLUCOSE 582* 100* 200*  BUN 14 12 9   CREATININE 0.99 1.00 0.72  CALCIUM  8.9  --  8.7*  MG 2.3  --   --    GFR: Estimated Creatinine Clearance: 138.9 mL/min (by C-G formula based on SCr of 0.72 mg/dL). Liver Function Tests: Recent Labs  Lab 01/14/23 0016  AST 25   ALT 34  ALKPHOS 87  BILITOT 0.6  PROT 8.0  ALBUMIN 4.2   Recent Labs  Lab 01/14/23 0016  LIPASE 31   No results for input(s): AMMONIA in the last 168 hours. Coagulation Profile: No results for input(s): INR, PROTIME in the last 168 hours. Cardiac Enzymes: No results for input(s): CKTOTAL, CKMB, CKMBINDEX, TROPONINI in the last 168 hours. BNP (last 3 results) No results for input(s): PROBNP in the last 8760 hours. HbA1C: Recent Labs    01/14/23 0638  HGBA1C 12.2*   CBG: Recent Labs  Lab 01/14/23 2334 01/15/23 0351 01/15/23 0734 01/15/23 1151 01/15/23 1326  GLUCAP 262* 84 160* 311* 486*   Lipid Profile: No results for input(s): CHOL, HDL, LDLCALC, TRIG, CHOLHDL, LDLDIRECT in the last 72 hours. Thyroid Function Tests: No results for input(s): TSH, T4TOTAL, FREET4, T3FREE, THYROIDAB in the last 72 hours. Anemia Panel: No results for input(s): VITAMINB12, FOLATE, FERRITIN, TIBC, IRON, RETICCTPCT in the last 72 hours. Urine analysis:    Component Value Date/Time   COLORURINE COLORLESS (A) 01/13/2023 2322   APPEARANCEUR CLEAR 01/13/2023 2322   LABSPEC 1.031 (H) 01/13/2023 2322   PHURINE 6.0 01/13/2023 2322   GLUCOSEU >=500 (A) 01/13/2023 2322   HGBUR NEGATIVE 01/13/2023 2322   BILIRUBINUR NEGATIVE 01/13/2023 2322   KETONESUR 5 (A) 01/13/2023 2322   PROTEINUR NEGATIVE 01/13/2023 2322   UROBILINOGEN 2.0 (H) 06/19/2021 1648   NITRITE NEGATIVE 01/13/2023 2322   LEUKOCYTESUR NEGATIVE 01/13/2023 2322   Sepsis Labs: @LABRCNTIP (procalcitonin:4,lacticidven:4)  )No results found for this or any previous visit (from the past 240 hours).    Studies: DG Abd 2 Views Result Date: 01/15/2023 CLINICAL DATA:  Constipation EXAM: ABDOMEN - 2 VIEW COMPARISON:  CT abdomen and pelvis dated 01/14/2023 FINDINGS: Nonobstructive bowel gas pattern. Gas-filled dilation of the sigmoid colon. No free air or pneumatosis. Small to moderate  volume stool at the splenic flexure. No abnormal radio-opaque calculi or mass effect. No acute or substantial osseous abnormality. The sacrum and coccyx are partially obscured by overlying bowel contents. Partially imaged lung bases are clear. IMPRESSION: 1. Small to moderate volume stool at the splenic flexure. 2. Nonobstructive bowel gas pattern. Gas-filled dilation of the sigmoid colon. Electronically Signed   By: Limin  Xu M.D.   On: 01/15/2023 08:23    Scheduled Meds:  insulin  aspart  0-9 Units Subcutaneous Q4H   living well with diabetes book   Does not apply Once   senna-docusate  1 tablet Oral BID   SMOG  400 mL Rectal Once    Continuous Infusions:     LOS: 1 day     Emilianna Barlowe J Sailor Hevia, MD [facility]  If 7PM-7AM, please contact night-coverage [email or web address] 01/15/2023, 2:21 PM

## 2023-01-15 NOTE — ED Notes (Signed)
 Pt has had multiple phlebotomy, [place] PIV sticks, and attempted to draw off IV with multiple attempts without success.

## 2023-01-15 NOTE — Inpatient Diabetes Management (Signed)
 Inpatient Diabetes Program Recommendations  AACE/ADA: New Consensus Statement on Inpatient Glycemic Control (2015)  Target Ranges:  Prepandial:   less than 140 mg/dL      Peak postprandial:   less than 180 mg/dL (1-2 hours)      Critically ill patients:  140 - 180 mg/dL   Lab Results  Component Value Date   GLUCAP 311 (H) 01/15/2023   HGBA1C 12.2 (H) 01/14/2023    Review of Glycemic Control  Latest Reference Range & Units 01/14/23 07:43 01/14/23 15:09 01/14/23 19:39 01/14/23 23:34 01/15/23 03:51 01/15/23 07:34 01/15/23 11:51  Glucose-Capillary 70 - 99 mg/dL 739 (H) 806 (H) 649 (H) 262 (H) 84 160 (H) 311 (H)  (H): Data is abnormally high  Diabetes history: DM2 Outpatient Diabetes medications: NONE Current orders for Inpatient glycemic control: Novolog  0-9 units Q4H  Met with patient at bedside.  He confirms he has DM2 and does not take any medications for DM.  Reviewed patient's current A1c of 12.2% (average BG of 303 mg/dL). Explained what a A1c is and what it measures. Also reviewed goal A1c with patient, importance of good glucose control @ home, and blood sugar goals.  He falls in and out of sleep when speaking with him.  Asking pharmacy for a benefit check on insulins.  Currently on clear liquid diet.  Peroral bowel prep.    Will continue to follow while inpatient.  Thank you, Wyvonna Pinal, MSN, CDCES Diabetes Coordinator Inpatient Diabetes Program [phone] (team pager from 8a-5p)

## 2023-01-16 ENCOUNTER — Encounter (HOSPITAL_COMMUNITY): Payer: Self-pay | Admitting: Gastroenterology

## 2023-01-16 ENCOUNTER — Inpatient Hospital Stay (HOSPITAL_COMMUNITY): Payer: MEDICAID

## 2023-01-16 DIAGNOSIS — E1165 Type 2 diabetes mellitus with hyperglycemia: Secondary | ICD-10-CM | POA: Diagnosis not present

## 2023-01-16 DIAGNOSIS — K5909 Other constipation: Secondary | ICD-10-CM | POA: Diagnosis not present

## 2023-01-16 DIAGNOSIS — R1084 Generalized abdominal pain: Secondary | ICD-10-CM | POA: Diagnosis not present

## 2023-01-16 DIAGNOSIS — E1169 Type 2 diabetes mellitus with other specified complication: Secondary | ICD-10-CM | POA: Diagnosis not present

## 2023-01-16 DIAGNOSIS — E782 Mixed hyperlipidemia: Secondary | ICD-10-CM

## 2023-01-16 LAB — URINALYSIS, ROUTINE W REFLEX MICROSCOPIC
Bacteria, UA: NONE SEEN
Bilirubin Urine: NEGATIVE
Glucose, UA: >=500 mg/dL — AB
Hgb urine dipstick: NEGATIVE
Ketones, ur: NEGATIVE mg/dL
Leukocytes,Ua: NEGATIVE
Nitrite: NEGATIVE
Protein, ur: NEGATIVE mg/dL
Specific Gravity, Urine: 1.013 (ref 1.005–1.030)
pH: 5 (ref 5.0–8.0)

## 2023-01-16 LAB — CBC
HCT: 37.8 % — ABNORMAL LOW (ref 39.0–52.0)
Hemoglobin: 12.9 g/dL — ABNORMAL LOW (ref 13.0–17.0)
MCH: 30.4 pg (ref 26.0–34.0)
MCHC: 34.1 g/dL (ref 30.0–36.0)
MCV: 88.9 fL (ref 80.0–100.0)
Platelets: 230 10*3/uL (ref 150–400)
RBC: 4.25 MIL/uL (ref 4.22–5.81)
RDW: 12.8 % (ref 11.5–15.5)
WBC: 6.1 10*3/uL (ref 4.0–10.5)
nRBC: 0 % (ref 0.0–0.2)

## 2023-01-16 LAB — GLUCOSE, CAPILLARY
Glucose-Capillary: 140 mg/dL — ABNORMAL HIGH (ref 70–99)
Glucose-Capillary: 148 mg/dL — ABNORMAL HIGH (ref 70–99)
Glucose-Capillary: 177 mg/dL — ABNORMAL HIGH (ref 70–99)
Glucose-Capillary: 258 mg/dL — ABNORMAL HIGH (ref 70–99)
Glucose-Capillary: 262 mg/dL — ABNORMAL HIGH (ref 70–99)
Glucose-Capillary: 299 mg/dL — ABNORMAL HIGH (ref 70–99)
Glucose-Capillary: 365 mg/dL — ABNORMAL HIGH (ref 70–99)

## 2023-01-16 LAB — BASIC METABOLIC PANEL
Anion gap: 9 (ref 5–15)
BUN: <5 mg/dL — ABNORMAL LOW (ref 6–20)
CO2: 22 mmol/L (ref 22–32)
Calcium: 9 mg/dL (ref 8.9–10.3)
Chloride: 105 mmol/L (ref 98–111)
Creatinine, Ser: 0.79 mg/dL (ref 0.61–1.24)
GFR, Estimated: >60 mL/min (ref >60)
Glucose, Bld: 142 mg/dL — ABNORMAL HIGH (ref 70–99)
Potassium: 4.4 mmol/L (ref 3.5–5.1)
Sodium: 136 mmol/L (ref 135–145)

## 2023-01-16 LAB — PROTEIN / CREATININE RATIO, URINE
Creatinine, Urine: 63 mg/dL
Total Protein, Urine: <6 mg/dL

## 2023-01-16 MED ORDER — LISINOPRIL 5 MG PO TABS
5.0000 mg | ORAL_TABLET | Freq: Every day | ORAL | Status: DC
  Administered 2023-01-16 – 2023-01-17 (×2): 5 mg via ORAL
  Filled 2023-01-16 (×2): qty 1

## 2023-01-16 MED ORDER — POLYETHYLENE GLYCOL 3350 17 G PO PACK
17.0000 g | PACK | Freq: Two times a day (BID) | ORAL | Status: DC
  Administered 2023-01-16 – 2023-01-17 (×3): 17 g via ORAL
  Filled 2023-01-16 (×3): qty 1

## 2023-01-16 MED ORDER — INSULIN ASPART 100 UNIT/ML IJ SOLN
0.0000 [IU] | Freq: Three times a day (TID) | INTRAMUSCULAR | Status: DC
  Administered 2023-01-16: 8 [IU] via SUBCUTANEOUS
  Administered 2023-01-17 (×2): 3 [IU] via SUBCUTANEOUS

## 2023-01-16 NOTE — Evaluation (Signed)
 Physical Therapy Evaluation Patient Details Name: Roger Woods MRN: [id number] DOB: December 30, 1970 Today's Date: 01/16/2023  History of Present Illness  Roger Woods is a 53 y.o. male with medical history significant of DM2, HLD, schizophrenia, bipolar disorder ,cervical fusion and multiple surgeries in neck presented to the ED 01/13/23 complaining of constipation and abdominal distention/pain intermittently for several months as per patient.CT abdomen pelvis showing findings suspicious for sigmoid volvulus  Clinical Impression  Pt admitted with above diagnosis.  Pt currently with functional limitations due to the deficits listed below (see PT Problem List). Pt will benefit from acute skilled PT to increase their independence and safety with mobility to allow discharge.       The patient presents with significant neurological deficits of the LE's/trunk that he  states are long standing. States that he is waiting to have further surgery.  Patient reports inconsistent function.' RN reports that patient ambulated with RW 1/7, today has to be lifted to transfer.  Patient will benefit from a WC to improve safety and function on days that his motor function is  impaired.  PT will follow  tomorrow.    If plan is discharge home, recommend the following: Assistance with cooking/housework;Assist for transportation;Help with stairs or ramp for entrance;A lot of help with walking and/or transfers   Can travel by private vehicle        Equipment Recommendations Wheelchair cushion (measurements PT);Wheelchair (measurements PT)  Recommendations for Other Services       Functional Status Assessment Patient has had a recent decline in their functional status and/or demonstrates limited ability to make significant improvements in function in a reasonable and predictable amount of time     Precautions / Restrictions Precautions Precautions: Fall Precaution Comments: legs and trunlock up, will get  tremors and  has had multiple falls. Restrictions Weight Bearing Restrictions Per Provider Order: No      Mobility  Bed Mobility               General bed mobility comments: seated on bed edge    Transfers Overall transfer level: Needs assistance Equipment used: Rolling walker (2 wheels) Transfers: Sit to/from Stand, Bed to chair/wheelchair/BSC Sit to Stand: +2 safety/equipment, +2 physical assistance, Max assist Stand pivot transfers: Max assist, +2 physical assistance, +2 safety/equipment         General transfer comment: nephew transferred to  chair while PT out of rom, nephew and PT attepted to assist to transfer to  [facility], Patient unable to  pivot. Max assist to stand from small low chair, patient able to turn small seps to bed.    Ambulation/Gait                  Stairs            Wheelchair Mobility     Tilt Bed    Modified Rankin (Stroke Patients Only)       Balance Overall balance assessment: Needs assistance, History of Falls Sitting-balance support: Feet supported, No upper extremity supported Sitting balance-Leahy Scale: Fair     Standing balance support: Bilateral upper extremity supported, Reliant on assistive device for balance, During functional activity Standing balance-Leahy Scale: Poor                               Pertinent Vitals/Pain Pain Assessment Pain Assessment: Faces Faces Pain Scale: Hurts even more Pain Location: back neck Pain Descriptors / Indicators: Discomfort  Home Living Family/patient expects to be discharged to:: Private residence Living Arrangements: Children;Spouse/significant other;Other relatives Available Help at Discharge: Family;Available 24 hours/day Type of Home: House Home Access: Stairs to enter       Home Layout: Able to live on main level with bedroom/bathroom Home Equipment: [employer] (2 wheels);Wheelchair - manual Additional Comments: reports borrowed Wc that is  broken    Prior Function Prior Level of Function : Needs assist       Physical Assist : Mobility (physical) Mobility (physical): Bed mobility;Transfers;Gait   Mobility Comments: reports inconsistent that he can ambulate and then cannot, will fall as leg  give out       Extremity/Trunk Assessment        Lower Extremity Assessment Lower Extremity Assessment: RLE deficits/detail;LLE deficits/detail RLE Deficits / Details: knee flex to ~ 60 passive, unable to step when standing, impaired snsation LLE Deficits / Details: able to flex knee  to 90 , has to manually move    Cervical / Trunk Assessment Cervical / Trunk Assessment: Neck Surgery;Back Surgery  Communication   Communication Communication: No apparent difficulties  Cognition Arousal: Alert Behavior During Therapy: WFL for tasks assessed/performed Overall Cognitive Status: Within Functional Limits for tasks assessed                                          General Comments      Exercises     Assessment/Plan    PT Assessment Patient needs continued PT services  PT Problem List Decreased strength;Decreased balance;Decreased knowledge of precautions;Decreased mobility;Decreased range of motion;Decreased activity tolerance;Decreased coordination;Impaired sensation       PT Treatment Interventions DME instruction;Therapeutic activities;Therapeutic exercise;Patient/family education;Gait training;Functional mobility training;Wheelchair mobility training;Neuromuscular re-education    PT Goals (Current goals can be found in the Care Plan section)  Acute Rehab PT Goals Patient Stated Goal: to get  back surgery and get  muscles working PT Goal Formulation: With patient/family Time For Goal Achievement: 01/30/23 Potential to Achieve Goals: Good    Frequency Min 1X/week     Co-evaluation               AM-PAC PT 6 Clicks Mobility  Outcome Measure Help needed turning from your back to your  side while in a flat bed without using bedrails?: None Help needed moving from lying on your back to sitting on the side of a flat bed without using bedrails?: None Help needed moving to and from a bed to a chair (including a wheelchair)?: A Lot Help needed standing up from a chair using your arms (e.g., wheelchair or bedside chair)?: A Lot Help needed to walk in hospital room?: Total Help needed climbing 3-5 steps with a railing? : Total 6 Click Score: 14    End of Session Equipment Utilized During Treatment: Gait belt Activity Tolerance: Patient tolerated treatment well Patient left: in bed;with family/visitor present;with call bell/phone within reach;with bed alarm set Nurse Communication: Mobility status PT Visit Diagnosis: Unsteadiness on feet (R26.81);Difficulty in walking, not elsewhere classified (R26.2);Other symptoms and signs involving the nervous system (R29.898)    Time: 8490-8464 PT Time Calculation (min) (ACUTE ONLY): 26 min   Charges:   PT Evaluation $PT Eval Low Complexity: 1 Low PT Treatments $Therapeutic Activity: 8-22 mins PT General Charges $$ ACUTE PT VISIT: 1 Visit         Darice Potters PT Acute Rehabilitation Services  Office [phone] Weekend pager-[phone]   Leigh Darice Norris 01/16/2023, 4:11 PM

## 2023-01-16 NOTE — Progress Notes (Signed)
 Subjective: Lots stool output with prep.  Objective: Vital signs in last 24 hours: Temp:  [97.8 F (36.6 C)-98.4 F (36.9 C)] 98 F (36.7 C) (01/08 1313) Pulse Rate:  [53-59] 58 (01/08 1313) Resp:  [16-19] 18 (01/08 1313) BP: (100-128)/(59-76) 103/75 (01/08 1313) SpO2:  [97 %-100 %] 99 % (01/08 1313) Weight change:  Last BM Date : 01/15/23  PE: GEN:  NAD ABD:  Less distended, no peritonitis  Lab Results: CBC    Component Value Date/Time   WBC 6.1 01/16/2023 0819   RBC 4.25 01/16/2023 0819   HGB 12.9 (L) 01/16/2023 0819   HGB 13.7 10/26/2022 1603   HCT 37.8 (L) 01/16/2023 0819   HCT 40.4 10/26/2022 1603   PLT 230 01/16/2023 0819   PLT 234 10/26/2022 1603   MCV 88.9 01/16/2023 0819   MCV 91 10/26/2022 1603   MCH 30.4 01/16/2023 0819   MCHC 34.1 01/16/2023 0819   RDW 12.8 01/16/2023 0819   RDW 12.7 10/26/2022 1603   LYMPHSABS 1.0 07/08/2021 0609   MONOABS 0.5 07/08/2021 0609   EOSABS 0.3 07/08/2021 0609   BASOSABS 0.0 07/08/2021 0609  CMP     Component Value Date/Time   NA 136 01/16/2023 0819   NA 138 11/06/2022 1605   K 4.4 01/16/2023 0819   CL 105 01/16/2023 0819   CO2 22 01/16/2023 0819   GLUCOSE 142 (H) 01/16/2023 0819   BUN <5 (L) 01/16/2023 0819   BUN 9 11/06/2022 1605   CREATININE 0.79 01/16/2023 0819   CALCIUM  9.0 01/16/2023 0819   PROT 8.0 01/14/2023 0016   ALBUMIN 4.2 01/14/2023 0016   AST 25 01/14/2023 0016   ALT 34 01/14/2023 0016   ALKPHOS 87 01/14/2023 0016   BILITOT 0.6 01/14/2023 0016   EGFR 95 11/06/2022 1605   GFRNONAA >60 01/16/2023 0819   Assessment:   Constipation/obstipation.  Better clinically and upon today's repeat xray.  Plan:   Advance soft diet. Miralax  17 grams po bid. 3.  If doing ok, could go home later today or in morning, and will arrange outpatient follow-up with us , and outpatient colonoscopy. 4.  Margarete Berke will sign-off; please call with questions; thank you for the consultation.   BURNETTE ELSIE HERO 01/16/2023,  1:26 PM   Cell [phone] If no answer or after 5 PM call [phone]

## 2023-01-16 NOTE — Plan of Care (Signed)
  Problem: Metabolic: Goal: Ability to maintain appropriate glucose levels will improve Outcome: Progressing   Problem: Skin Integrity: Goal: Risk for impaired skin integrity will decrease Outcome: Progressing   Problem: Education: Goal: Knowledge of General Education information will improve Description: Including pain rating scale, medication(s)/side effects and non-pharmacologic comfort measures Outcome: Progressing   Problem: Clinical Measurements: Goal: Will remain free from infection Outcome: Progressing   Problem: Elimination: Goal: Will not experience complications related to bowel motility Outcome: Progressing   Problem: Pain Management: Goal: General experience of comfort will improve Outcome: Progressing   Problem: Safety: Goal: Ability to remain free from injury will improve Outcome: Progressing

## 2023-01-16 NOTE — Progress Notes (Signed)
 PROGRESS NOTE   Roger Woods  FMW:[id number] DOB: January 16, 1970 DOA: 01/13/2023 PCP: [facility]   Date of Service: the patient was seen and examined on 01/16/2023  Brief Narrative:  53 y.o. male with medical history significant of DM2, HLD, schizophrenia, bipolar disorder presented to the [facility] emergency department complaining of constipation and abdominal distention and abdominal for the past several months.    Upon eval patient in the emergency department CT imaging of the abdomen pelvis was performed revealing findings suspicious for sigmoid volvulus. General surgery and GI consulted.  Hospital service was then called to assess the patient for admission of the hospital.  While being evaluated on the hospitalist service further review of the images by both gastroenterology and general surgery felt that the imaging was not consistent with volvulus.  It was recommended the patient be initiated on a clear liquid diet with minimization of narcotics and increasing doses of MiraLAX  and Gatorade preparation.  Patient's presentation was also complicated by severe hyperglycemia with no DKA.  Patient was found to have a hemoglobin A1c of 12.2.  This was managed with increasing doses of subcutaneous insulin .   Assessment & Plan Generalized abdominal pain Initial concern for volvulus however after consults with gastroenterology and general surgery it is felt the patient is more likely suffer from severe constipation resulting in abdominal pain Likely improving with aggressive regimen of laxatives Advancing diet At time of discharge patient will need follow-up with gastroenterology for likely outpatient endoscopic workup Constipation Assessment and plan as above Uncontrolled type 2 diabetes mellitus with hyperglycemia, without long-term current use of insulin  (HCC) Accu-Cheks before every meal and nightly with sliding scale insulin  Hemoglobin A1c found to be 12.2% Lacing patient  on basal bolus insulin  therapy with 18 units of Lantus  nightly and 6 units of NovoLog  before every meal Mixed diabetic hyperlipidemia associated with type 2 diabetes mellitus (HCC) Resume home regimen of Crestor      Subjective:  Patient reports he has had several large bowel movements resulting in significant improvement in his abdominal pain.  Pain is now mild in intensity, generalized sore in quality.  Patient denies any nausea or vomiting.  Physical Exam:  Vitals:   01/15/23 1322 01/15/23 1717 01/15/23 2042 01/16/23 0409  BP: 122/76 119/67 128/76 (!) 100/59  Pulse: 64 (!) 59 (!) 55 (!) 53  Resp: 18 16 19 18   Temp: 98.2 F (36.8 C) 98.4 F (36.9 C) 98 F (36.7 C) 97.8 F (36.6 C)  TempSrc: Oral Oral Oral Oral  SpO2: 97% 100% 100% 97%  Weight:      Height:         Constitutional: Awake alert and oriented x3, no associated distress.   Skin: no rashes, no lesions, good skin turgor noted. Eyes: Pupils are equally reactive to light.  No evidence of scleral icterus or conjunctival pallor.  ENMT: Moist mucous membranes noted.  Posterior pharynx clear of any exudate or lesions.   Respiratory: clear to auscultation bilaterally, no wheezing, no crackles. Normal respiratory effort. No accessory muscle use.  Cardiovascular: Regular rate and rhythm, no murmurs / rubs / gallops. No extremity edema. 2+ pedal pulses. No carotid bruits.  Abdomen: Soft right generalized abdominal tenderness.  Abdomen is soft.  No evidence of intra-abdominal masses.  Positive bowel sounds noted in all quadrants.   Musculoskeletal: No joint deformity upper and lower extremities. Good ROM, no contractures. Normal muscle tone.    Data Reviewed:  I have personally reviewed and interpreted labs,  imaging.  Significant findings are   CBC: Recent Labs  Lab 01/14/23 0016 01/14/23 0638 01/14/23 1220 01/15/23 0538 01/16/23 0819  WBC 7.8 6.4  --  5.8 6.1  HGB 13.6 12.6* 12.9* 10.6* 12.9*  HCT 38.5* 35.6*  38.0* 31.2* 37.8*  MCV 87.5 87.5  --  90.4 88.9  PLT 224 222  --  178 230   Basic Metabolic Panel: Recent Labs  Lab 01/14/23 0016 01/14/23 1220 01/15/23 0941 01/16/23 0819  NA 126* 140 130* 136  K 4.3 3.3* 4.3 4.4  CL 93* 101 101 105  CO2 23  --  19* 22  GLUCOSE 582* 100* 200* 142*  BUN 14 12 9  <5*  CREATININE 0.99 1.00 0.72 0.79  CALCIUM  8.9  --  8.7* 9.0  MG 2.3  --   --   --    GFR: Estimated Creatinine Clearance: 138.9 mL/min (by C-G formula based on SCr of 0.79 mg/dL). Liver Function Tests: Recent Labs  Lab 01/14/23 0016  AST 25  ALT 34  ALKPHOS 87  BILITOT 0.6  PROT 8.0  ALBUMIN 4.2    Code Status:  Full code.  Code status decision has been confirmed with: patient    Severity of Illness:  The appropriate patient status for this patient is INPATIENT. Inpatient status is judged to be reasonable and necessary in order to provide the required intensity of service to ensure the patient's safety. The patient's presenting symptoms, physical exam findings, and initial radiographic and laboratory data in the context of their chronic comorbidities is felt to place them at high risk for further clinical deterioration. Furthermore, it is not anticipated that the patient will be medically stable for discharge from the hospital within 2 midnights of admission.   * I certify that at the point of admission it is my clinical judgment that the patient will require inpatient hospital care spanning beyond 2 midnights from the point of admission due to high intensity of service, high risk for further deterioration and high frequency of surveillance required.*  Time spent:  39 minutes  Author:  Zachary JINNY Ba MD  01/16/2023 10:49 AM

## 2023-01-16 NOTE — TOC CM/SW Note (Signed)
 Transition of Care [facility]) - Inpatient Brief Assessment   Patient Details  Name: MILBERT BIXLER MRN: [id number] Date of Birth: 1970/04/01  Transition of Care [facility]) CM/SW Contact:    Tawni CHRISTELLA Eva, LCSW Phone Number: 01/16/2023, 1:16 PM   Clinical Narrative:  Pt has PCP and no SDOH needs. TOC sign off.  Transition of Care Asessment: Insurance and Status: Insurance coverage has been reviewed Patient has primary care physician: Yes Home environment has been reviewed: home with self Prior level of function:: independent Prior/Current Home Services: No current home services Social Drivers of Health Review: SDOH reviewed no interventions necessary Readmission risk has been reviewed: Yes Transition of care needs: no transition of care needs at this time

## 2023-01-16 NOTE — Inpatient Diabetes Management (Addendum)
 Inpatient Diabetes Program Recommendations  AACE/ADA: New Consensus Statement on Inpatient Glycemic Control (2015)  Target Ranges:  Prepandial:   less than 140 mg/dL      Peak postprandial:   less than 180 mg/dL (1-2 hours)      Critically ill patients:  140 - 180 mg/dL   Lab Results  Component Value Date   GLUCAP 177 (H) 01/16/2023   HGBA1C 12.2 (H) 01/14/2023    Inpatient diabetes recommendations:  Please consider:  Semglee  12 units at bedtime Novolog  0-9 units TID with meals   Discharge Recommendations: Long acting recommendations: Insulin  Glargine (LANTUS ) Solostar Pen 15 units QHS  Supply/Referral recommendations: Glucometer Test strips Lancet device Pen needles - standard   Use Adult Diabetes Insulin  Treatment Post Discharge order set.   Met with patient at bedside.  We reviewed the Living Well with Diabetes booklet.  Reviewed portion sizes, the plate method, carbohydrates, exercise and glucose goals.  Explained long and short term complications of uncontrolled glucose.  He will need to follow up with his PCP every 3 months and bring his glucometer with him.    Educated patient on insulin  pen use at home. Reviewed contents of insulin  flexpen starter kit. Reviewed all steps of insulin  pen including attachment of needle, 2-unit air shot, dialing up dose, giving injection, removing needle, disposal of sharps, storage of unused insulin , disposal of insulin  etc. Also reviewed troubleshooting with insulin  pen. MD to give patient Rxs for insulin  pens and insulin  pen needles.  Will follow up again tomorrow to reinforce insulin  pen education.    Will continue to follow while inpatient.  Thank you, Wyvonna Pinal, MSN, CDCES Diabetes Coordinator Inpatient Diabetes Program [phone] (team pager from 8a-5p)
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
## 2023-01-17 DIAGNOSIS — R600 Localized edema: Secondary | ICD-10-CM

## 2023-01-17 DIAGNOSIS — K5900 Constipation, unspecified: Secondary | ICD-10-CM

## 2023-01-17 DIAGNOSIS — R1084 Generalized abdominal pain: Secondary | ICD-10-CM

## 2023-01-17 DIAGNOSIS — F39 Unspecified mood [affective] disorder: Secondary | ICD-10-CM

## 2023-01-17 DIAGNOSIS — E1169 Type 2 diabetes mellitus with other specified complication: Secondary | ICD-10-CM | POA: Diagnosis not present

## 2023-01-17 LAB — CBC WITH DIFFERENTIAL/PLATELET
Abs Immature Granulocytes: 0.02 10*3/uL (ref 0.00–0.07)
Basophils Absolute: 0.1 10*3/uL (ref 0.0–0.1)
Basophils Relative: 1 %
Eosinophils Absolute: 0.4 10*3/uL (ref 0.0–0.5)
Eosinophils Relative: 5 %
HCT: 41 % (ref 39.0–52.0)
Hemoglobin: 13.1 g/dL (ref 13.0–17.0)
Immature Granulocytes: 0 %
Lymphocytes Relative: 42 %
Lymphs Abs: 2.8 10*3/uL (ref 0.7–4.0)
MCH: 29.6 pg (ref 26.0–34.0)
MCHC: 32 g/dL (ref 30.0–36.0)
MCV: 92.6 fL (ref 80.0–100.0)
Monocytes Absolute: 0.6 10*3/uL (ref 0.1–1.0)
Monocytes Relative: 9 %
Neutro Abs: 2.9 10*3/uL (ref 1.7–7.7)
Neutrophils Relative %: 43 %
Platelets: 242 10*3/uL (ref 150–400)
RBC: 4.43 MIL/uL (ref 4.22–5.81)
RDW: 12.7 % (ref 11.5–15.5)
WBC: 6.7 10*3/uL (ref 4.0–10.5)
nRBC: 0 % (ref 0.0–0.2)

## 2023-01-17 LAB — COMPREHENSIVE METABOLIC PANEL
ALT: 53 U/L — ABNORMAL HIGH (ref 0–44)
AST: 36 U/L (ref 15–41)
Albumin: 3.9 g/dL (ref 3.5–5.0)
Alkaline Phosphatase: 71 U/L (ref 38–126)
Anion gap: 8 (ref 5–15)
BUN: 8 mg/dL (ref 6–20)
CO2: 23 mmol/L (ref 22–32)
Calcium: 8.9 mg/dL (ref 8.9–10.3)
Chloride: 102 mmol/L (ref 98–111)
Creatinine, Ser: 0.76 mg/dL (ref 0.61–1.24)
GFR, Estimated: >60 mL/min (ref >60)
Glucose, Bld: 172 mg/dL — ABNORMAL HIGH (ref 70–99)
Potassium: 4.1 mmol/L (ref 3.5–5.1)
Sodium: 133 mmol/L — ABNORMAL LOW (ref 135–145)
Total Bilirubin: 0.5 mg/dL (ref 0.0–1.2)
Total Protein: 7 g/dL (ref 6.5–8.1)

## 2023-01-17 LAB — GLUCOSE, CAPILLARY
Glucose-Capillary: 173 mg/dL — ABNORMAL HIGH (ref 70–99)
Glucose-Capillary: 188 mg/dL — ABNORMAL HIGH (ref 70–99)

## 2023-01-17 LAB — MAGNESIUM: Magnesium: 2.2 mg/dL (ref 1.7–2.4)

## 2023-01-17 MED ORDER — OXYCODONE HCL 10 MG PO TABS: 10.0000 mg | ORAL_TABLET | Freq: Four times a day (QID) | ORAL | 0 refills | Status: AC | PRN

## 2023-01-17 MED ORDER — POLYETHYLENE GLYCOL 3350 17 G PO PACK: 17.0000 g | PACK | Freq: Two times a day (BID) | ORAL | 2 refills | Status: AC

## 2023-01-17 MED ORDER — PEN NEEDLES 31G X 5 MM MISC: 1.0000 | Freq: Three times a day (TID) | 2 refills | Status: AC

## 2023-01-17 MED ORDER — BLOOD GLUCOSE TEST VI STRP: 1.0000 | ORAL_STRIP | Freq: Three times a day (TID) | 2 refills | Status: AC

## 2023-01-17 MED ORDER — LANCET DEVICE MISC: 1.0000 | Freq: Three times a day (TID) | 0 refills | Status: AC

## 2023-01-17 MED ORDER — METFORMIN HCL 500 MG PO TABS: 500.0000 mg | ORAL_TABLET | Freq: Two times a day (BID) | ORAL | 2 refills | Status: AC

## 2023-01-17 MED ORDER — FUROSEMIDE 40 MG PO TABS
40.0000 mg | ORAL_TABLET | Freq: Every day | ORAL | Status: DC
Start: 2023-01-17 — End: 2023-01-17

## 2023-01-17 MED ORDER — ROSUVASTATIN CALCIUM 5 MG PO TABS
5.0000 mg | ORAL_TABLET | Freq: Every day | ORAL | Status: DC
  Administered 2023-01-17: 5 mg via ORAL
  Filled 2023-01-17: qty 1

## 2023-01-17 MED ORDER — INSULIN ASPART 100 UNIT/ML IJ SOLN
6.0000 [IU] | Freq: Three times a day (TID) | INTRAMUSCULAR | Status: DC
  Administered 2023-01-17 (×2): 6 [IU] via SUBCUTANEOUS

## 2023-01-17 MED ORDER — BLOOD GLUCOSE MONITORING SUPPL DEVI: 1.0000 | Freq: Three times a day (TID) | 0 refills | Status: AC

## 2023-01-17 MED ORDER — INSULIN GLARGINE 100 UNIT/ML SOLOSTAR PEN: 15.0000 [IU] | PEN_INJECTOR | Freq: Every day | SUBCUTANEOUS | 2 refills | Status: AC

## 2023-01-17 MED ORDER — SENNOSIDES-DOCUSATE SODIUM 8.6-50 MG PO TABS: 2.0000 | ORAL_TABLET | Freq: Every day | ORAL | 2 refills | Status: AC

## 2023-01-17 MED ORDER — LANCETS MISC: 1.0000 | Freq: Three times a day (TID) | 2 refills | Status: AC
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
## 2023-01-17 NOTE — Plan of Care (Signed)
  Problem: Activity: Goal: Risk for activity intolerance will decrease Outcome: Progressing   Problem: Elimination: Goal: Will not experience complications related to bowel motility Outcome: Progressing   Problem: Pain Management: Goal: General experience of comfort will improve Outcome: Progressing   Problem: Safety: Goal: Ability to remain free from injury will improve Outcome: Progressing

## 2023-01-17 NOTE — Assessment & Plan Note (Signed)
 Resume home regimen of Crestor

## 2023-01-17 NOTE — Assessment & Plan Note (Signed)
 Initial concern for volvulus however after consults with gastroenterology and general surgery it is felt the patient is more likely suffer from severe constipation resulting in abdominal pain Likely improving with aggressive regimen of laxatives Advancing diet At time of discharge patient will need follow-up with gastroenterology for likely outpatient endoscopic workup

## 2023-01-17 NOTE — Assessment & Plan Note (Signed)
·   Assessment and plan as above °

## 2023-01-17 NOTE — Inpatient Diabetes Management (Signed)
 Inpatient Diabetes Program Recommendations  AACE/ADA: New Consensus Statement on Inpatient Glycemic Control (2015)  Target Ranges:  Prepandial:   less than 140 mg/dL      Peak postprandial:   less than 180 mg/dL (1-2 hours)      Critically ill patients:  140 - 180 mg/dL   Lab Results  Component Value Date   GLUCAP 188 (H) 01/17/2023   HGBA1C 12.2 (H) 01/14/2023    Met with patient and family at bedside.  Reviewed insulin  pen administration,  signs and symptoms of hypoglycemia, treatments of hypoglycemia, when to administer Lantus  and dose, and when to check glucose.  He will take his glucometer to PCP visits.    Discharge Recommendations: Long acting recommendations: Insulin  Glargine (LANTUS ) Solostar Pen 12 units QHS  Supply/Referral recommendations: Glucometer Test strips Lancet device Pen needles - standard   Use Adult Diabetes Insulin  Treatment Post Discharge order set.

## 2023-01-17 NOTE — TOC Transition Note (Signed)
 Transition of Care [facility]) - Discharge Note   Patient Details  Name: Roger Woods MRN: [id number] Date of Birth: 1970/02/22  [facility] CM/SW Contact:  Tawni CHRISTELLA Eva, LCSW Phone Number: 01/17/2023, 12:10 PM   Clinical Narrative:     Pt was rec for wheelchair, referral sent to [employer], wheelchair to be delivered to pt's room prior to d/c. TOC sign off.        Patient Goals and CMS Choice            Discharge Placement                       Discharge Plan and Services Additional resources added to the After Visit Summary for                                       Social Drivers of Health (SDOH) Interventions SDOH Screenings   Food Insecurity: No Food Insecurity (01/15/2023)  Housing: Low Risk  (01/15/2023)  Transportation Needs: No Transportation Needs (01/15/2023)  Utilities: Not At Risk (01/15/2023)  Social Connections: Patient Declined (01/15/2023)  Tobacco Use: High Risk (01/14/2023)     Readmission Risk Interventions     No data to display

## 2023-01-17 NOTE — Assessment & Plan Note (Signed)
 Accu-Cheks before every meal and nightly with sliding scale insulin Hemoglobin A1c found to be 12.2% Lacing patient on basal bolus insulin therapy with 18 units of Lantus nightly and 6 units of NovoLog before every meal

## 2023-01-17 NOTE — Discharge Instructions (Signed)
 Please take all prescribed medications exactly as instructed including:  Taking your laxatives MiraLAX  and senna.  Please hold these medications that day if you are already experiencing loose stools or diarrhea. Taking your new insulin  regimen of Lantus  15 units every evening. Taking your new oral diabetic medication metformin  5 and milligrams twice daily. Please consume a low carbohydrate diet Please make sure to check your blood sugar 3 times daily prior to meals and record these blood sugars and a blood sugar diary.  Then discuss these blood sugars with your primary care provider upon follow-up so they can adjust your insulin  regimen as necessary. Please increase your physical activity as tolerated. Please maintain all outpatient follow-up appointments including follow-up with your primary care provider and Dr. Burnette with Gastroenterology. Please return to the emergency department if you develop worsening abdominal pain, fevers in excess of 100.4 F, weakness or inability to tolerate oral intake.

## 2023-02-22 IMAGING — CR DG CHEST 2V
2 series · 2 of 2 positions shown · non-contrast
Comparison: 02/26/2021

CLINICAL DATA: Shortness of breath and bilateral lower extremity
edema for 2 weeks.

EXAM:
CHEST - 2 VIEW

[w chest lat]
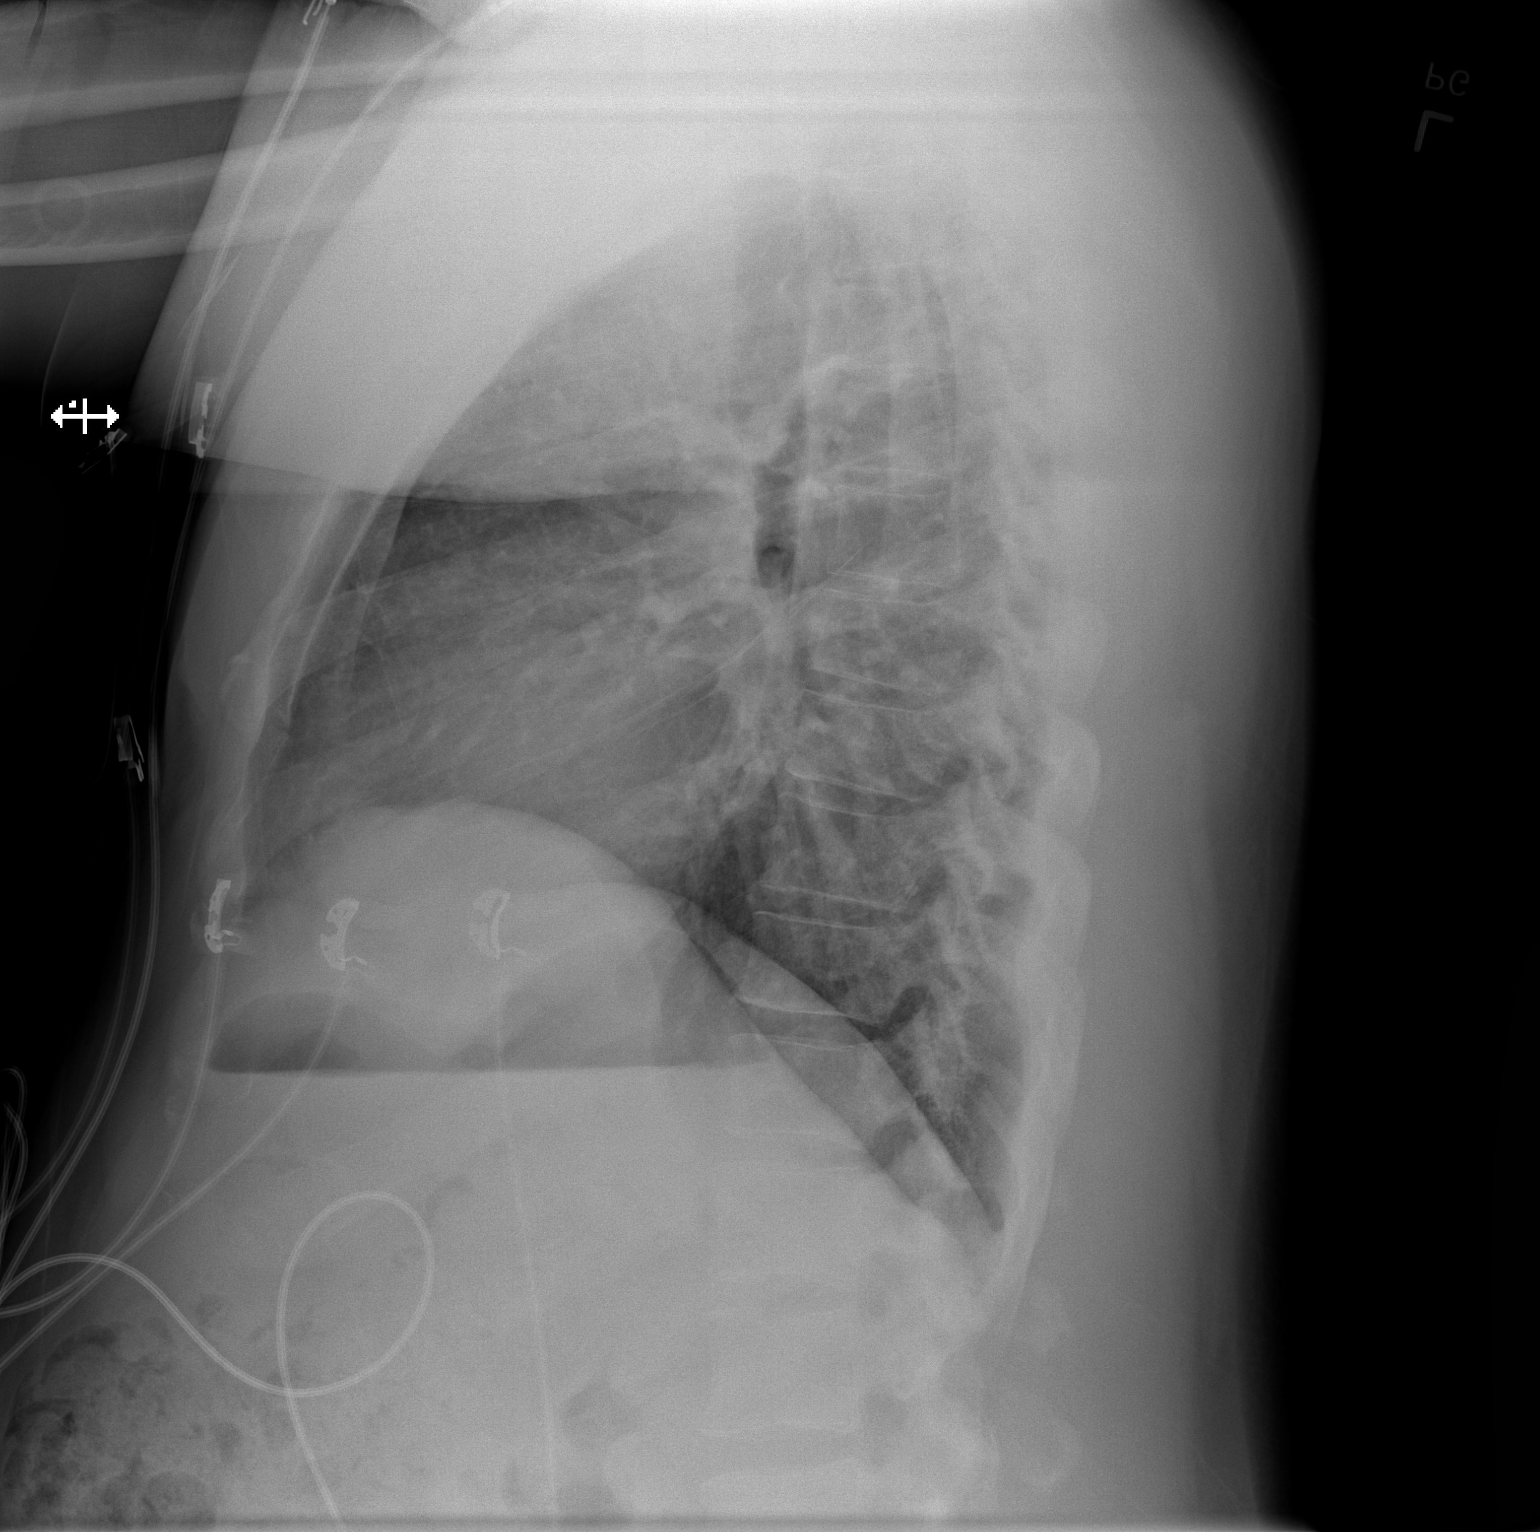

[w chest pa]
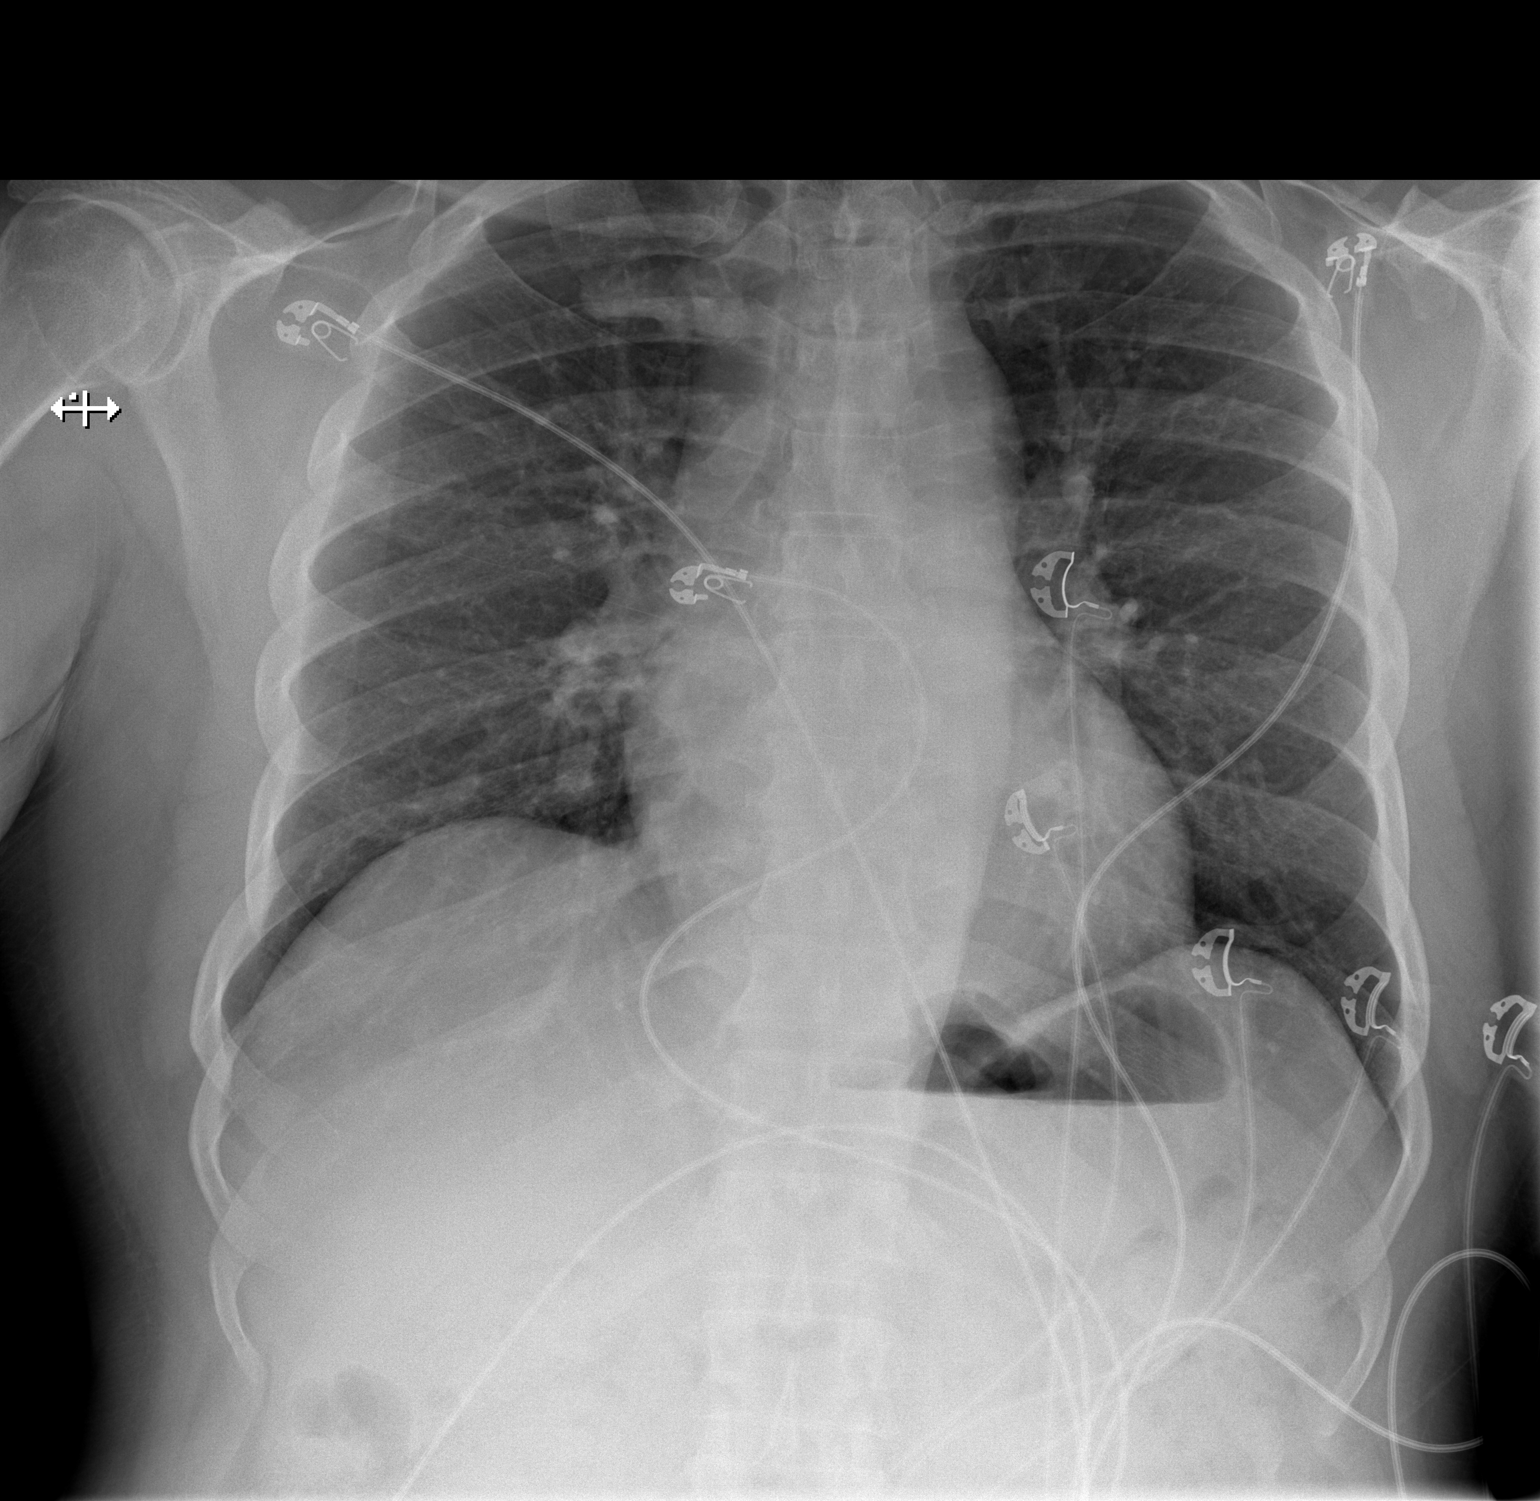

[2 of 2 positions shown; findings below may reference images not displayed]

FINDINGS: Normal heart size and pulmonary vascularity. No focal airspace
disease or consolidation in the lungs. No blunting of costophrenic
angles. No pneumothorax. Mediastinal contours appear intact.
Postoperative changes in the cervical spine.
IMPRESSION: No active cardiopulmonary disease.

## 2023-06-16 ENCOUNTER — Encounter (HOSPITAL_COMMUNITY): Payer: Self-pay

## 2023-06-16 ENCOUNTER — Emergency Department (HOSPITAL_COMMUNITY)
Admission: EM | Admit: 2023-06-16 | Discharge: 2023-06-16 | Discharge status: Against Medical Advice | Payer: MEDICAID | Attending: Emergency Medicine | Admitting: Emergency Medicine

## 2023-06-16 DIAGNOSIS — E119 Type 2 diabetes mellitus without complications: Secondary | ICD-10-CM | POA: Insufficient documentation

## 2023-06-16 DIAGNOSIS — R202 Paresthesia of skin: Secondary | ICD-10-CM | POA: Insufficient documentation

## 2023-06-16 DIAGNOSIS — M549 Dorsalgia, unspecified: Secondary | ICD-10-CM | POA: Diagnosis present

## 2023-06-16 DIAGNOSIS — M7989 Other specified soft tissue disorders: Secondary | ICD-10-CM | POA: Insufficient documentation

## 2023-06-16 DIAGNOSIS — Z5321 Procedure and treatment not carried out due to patient leaving prior to being seen by health care provider: Secondary | ICD-10-CM | POA: Diagnosis not present

## 2023-06-16 LAB — CBC
HCT: 42.8 % (ref 39.0–52.0)
Hemoglobin: 14 g/dL (ref 13.0–17.0)
MCH: 29.9 pg (ref 26.0–34.0)
MCHC: 32.7 g/dL (ref 30.0–36.0)
MCV: 91.5 fL (ref 80.0–100.0)
Platelets: 254 10*3/uL (ref 150–400)
RBC: 4.68 MIL/uL (ref 4.22–5.81)
RDW: 13.1 % (ref 11.5–15.5)
WBC: 7.6 10*3/uL (ref 4.0–10.5)
nRBC: 0 % (ref 0.0–0.2)

## 2023-06-16 LAB — BASIC METABOLIC PANEL WITH GFR
Anion gap: 11 (ref 5–15)
BUN: 14 mg/dL (ref 6–20)
CO2: 26 mmol/L (ref 22–32)
Calcium: 9.3 mg/dL (ref 8.9–10.3)
Chloride: 100 mmol/L (ref 98–111)
Creatinine, Ser: 1.21 mg/dL (ref 0.61–1.24)
GFR, Estimated: >60 mL/min (ref >60)
Glucose, Bld: 166 mg/dL — ABNORMAL HIGH (ref 70–99)
Potassium: 3.5 mmol/L (ref 3.5–5.1)
Sodium: 137 mmol/L (ref 135–145)

## 2023-06-16 LAB — BRAIN NATRIURETIC PEPTIDE: B Natriuretic Peptide: 23.6 pg/mL (ref 0.0–100.0)

## 2023-06-16 NOTE — ED Triage Notes (Addendum)
 Patient reports chronic back pain. States he has not been able to get the surgery. Also reports burning and tingling in his feet that he thinks is due to his diabetes. Patient states he cannot stand up or do anything for himself. States his blood sugar was 40 earlier this week. Also states he has swelling in his bilateral lower legs. States he can't get in to see his PCP.

## 2023-11-05 ENCOUNTER — Encounter: Payer: Self-pay | Admitting: Neurology

## 2023-11-06 ENCOUNTER — Other Ambulatory Visit: Payer: Self-pay | Admitting: Cardiology

## 2023-11-14 ENCOUNTER — Ambulatory Visit: Payer: MEDICAID | Attending: Orthopedic Surgery

## 2023-12-11 ENCOUNTER — Ambulatory Visit: Payer: MEDICAID | Attending: Orthopedic Surgery
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
## 2024-02-14 ENCOUNTER — Ambulatory Visit: Payer: MEDICAID | Admitting: Neurology
